# Patient Record
Sex: Female | Born: 1945 | Race: White | Hispanic: No | Marital: Married | Smoking: Never smoker
Health system: Southern US, Community
[De-identification: ages and names within clinical notes are randomized; demographics above are authoritative.]

## PROBLEM LIST (undated history)

## (undated) DIAGNOSIS — C801 Malignant (primary) neoplasm, unspecified: Secondary | ICD-10-CM

## (undated) DIAGNOSIS — I491 Atrial premature depolarization: Secondary | ICD-10-CM

## (undated) DIAGNOSIS — J45909 Unspecified asthma, uncomplicated: Secondary | ICD-10-CM

## (undated) DIAGNOSIS — I341 Nonrheumatic mitral (valve) prolapse: Secondary | ICD-10-CM

## (undated) DIAGNOSIS — F329 Major depressive disorder, single episode, unspecified: Secondary | ICD-10-CM

## (undated) DIAGNOSIS — R200 Anesthesia of skin: Secondary | ICD-10-CM

## (undated) DIAGNOSIS — R413 Other amnesia: Secondary | ICD-10-CM

## (undated) DIAGNOSIS — I890 Lymphedema, not elsewhere classified: Secondary | ICD-10-CM

## (undated) DIAGNOSIS — R2689 Other abnormalities of gait and mobility: Secondary | ICD-10-CM

## (undated) DIAGNOSIS — F419 Anxiety disorder, unspecified: Secondary | ICD-10-CM

## (undated) DIAGNOSIS — E785 Hyperlipidemia, unspecified: Secondary | ICD-10-CM

## (undated) DIAGNOSIS — C55 Malignant neoplasm of uterus, part unspecified: Secondary | ICD-10-CM

## (undated) DIAGNOSIS — I493 Ventricular premature depolarization: Secondary | ICD-10-CM

## (undated) DIAGNOSIS — K439 Ventral hernia without obstruction or gangrene: Secondary | ICD-10-CM

## (undated) DIAGNOSIS — I10 Essential (primary) hypertension: Secondary | ICD-10-CM

## (undated) DIAGNOSIS — C4490 Unspecified malignant neoplasm of skin, unspecified: Secondary | ICD-10-CM

## (undated) DIAGNOSIS — R011 Cardiac murmur, unspecified: Secondary | ICD-10-CM

## (undated) DIAGNOSIS — M1990 Unspecified osteoarthritis, unspecified site: Secondary | ICD-10-CM

## (undated) DIAGNOSIS — H409 Unspecified glaucoma: Secondary | ICD-10-CM

## (undated) DIAGNOSIS — I5189 Other ill-defined heart diseases: Secondary | ICD-10-CM

## (undated) DIAGNOSIS — K219 Gastro-esophageal reflux disease without esophagitis: Secondary | ICD-10-CM

## (undated) DIAGNOSIS — I7300 Raynaud's syndrome without gangrene: Secondary | ICD-10-CM

## (undated) HISTORY — PX: MITRAL VALVE REPAIR: SHX2039

## (undated) HISTORY — PX: TOTAL ABDOMINAL HYSTERECTOMY W/ BILATERAL SALPINGOOPHORECTOMY: SHX83

## (undated) HISTORY — DX: Atrial premature depolarization: I49.1

## (undated) HISTORY — PX: EYE SURGERY: SHX253

## (undated) HISTORY — PX: ABDOMINAL HYSTERECTOMY: SHX81

## (undated) HISTORY — PX: CATARACT EXTRACTION W/ INTRAOCULAR LENS IMPLANT: SHX1309

## (undated) HISTORY — PX: BREAST CYST ASPIRATION: SHX578

---

## 2005-05-29 ENCOUNTER — Ambulatory Visit: Payer: Self-pay | Admitting: Obstetrics and Gynecology

## 2006-01-12 ENCOUNTER — Emergency Department: Payer: Self-pay | Admitting: Emergency Medicine

## 2006-05-22 ENCOUNTER — Ambulatory Visit: Payer: Self-pay | Admitting: Obstetrics and Gynecology

## 2006-12-16 DIAGNOSIS — Z9889 Other specified postprocedural states: Secondary | ICD-10-CM

## 2006-12-16 HISTORY — DX: Other specified postprocedural states: Z98.890

## 2006-12-16 HISTORY — PX: LEFT HEART CATH AND CORONARY ANGIOGRAPHY: CATH118249

## 2006-12-17 DIAGNOSIS — Z952 Presence of prosthetic heart valve: Secondary | ICD-10-CM

## 2006-12-17 HISTORY — DX: Presence of prosthetic heart valve: Z95.2

## 2006-12-17 HISTORY — PX: MITRAL VALVE REPAIR: SHX2039

## 2006-12-24 ENCOUNTER — Other Ambulatory Visit: Payer: Self-pay

## 2006-12-24 ENCOUNTER — Emergency Department: Payer: Self-pay | Admitting: Emergency Medicine

## 2006-12-26 ENCOUNTER — Emergency Department: Payer: Self-pay | Admitting: Emergency Medicine

## 2006-12-26 ENCOUNTER — Other Ambulatory Visit: Payer: Self-pay

## 2007-01-22 ENCOUNTER — Encounter: Payer: Self-pay | Admitting: Internal Medicine

## 2007-02-04 ENCOUNTER — Encounter: Payer: Self-pay | Admitting: Internal Medicine

## 2007-03-06 ENCOUNTER — Encounter: Payer: Self-pay | Admitting: Internal Medicine

## 2007-04-06 ENCOUNTER — Encounter: Payer: Self-pay | Admitting: Internal Medicine

## 2007-05-06 ENCOUNTER — Encounter: Payer: Self-pay | Admitting: Internal Medicine

## 2007-06-26 ENCOUNTER — Ambulatory Visit: Payer: Self-pay | Admitting: Obstetrics and Gynecology

## 2007-07-04 ENCOUNTER — Encounter: Payer: Self-pay | Admitting: Internal Medicine

## 2008-08-10 ENCOUNTER — Ambulatory Visit: Payer: Self-pay | Admitting: Obstetrics and Gynecology

## 2008-10-12 ENCOUNTER — Ambulatory Visit (HOSPITAL_COMMUNITY): Admission: RE | Admit: 2008-10-12 | Discharge: 2008-10-13 | Payer: Self-pay | Admitting: Ophthalmology

## 2008-10-19 ENCOUNTER — Emergency Department: Payer: Self-pay | Admitting: Emergency Medicine

## 2008-11-11 ENCOUNTER — Ambulatory Visit (HOSPITAL_COMMUNITY): Admission: RE | Admit: 2008-11-11 | Discharge: 2008-11-12 | Payer: Self-pay | Admitting: Ophthalmology

## 2009-10-10 ENCOUNTER — Ambulatory Visit: Payer: Self-pay | Admitting: Obstetrics and Gynecology

## 2011-02-07 ENCOUNTER — Ambulatory Visit: Payer: Self-pay | Admitting: Obstetrics and Gynecology

## 2011-02-19 LAB — CBC
MCHC: 33.8 g/dL (ref 30.0–36.0)
RDW: 12.9 % (ref 11.5–15.5)

## 2011-02-19 LAB — PROTIME-INR
INR: 1 (ref 0.00–1.49)
Prothrombin Time: 13.9 seconds (ref 11.6–15.2)

## 2011-02-19 LAB — BASIC METABOLIC PANEL
CO2: 26 mEq/L (ref 19–32)
Calcium: 9.9 mg/dL (ref 8.4–10.5)
Creatinine, Ser: 0.77 mg/dL (ref 0.4–1.2)
Glucose, Bld: 126 mg/dL — ABNORMAL HIGH (ref 70–99)

## 2011-02-20 ENCOUNTER — Emergency Department: Payer: Self-pay | Admitting: Emergency Medicine

## 2011-03-20 NOTE — Op Note (Signed)
Whitney Pace, SOUDERS                  ACCOUNT NO.:  [id number]   MEDICAL RECORD NO.:  [id number]          PATIENT TYPE:  OIB   LOCATION:  5157                         FACILITY:  [facility]   PHYSICIAN:  Beulah Gandy. Ashley Royalty, M.D. DATE OF BIRTH:  01-17-1946   DATE OF PROCEDURE:  10/12/2008  DATE OF DISCHARGE:                               OPERATIVE REPORT   ADMISSION DIAGNOSIS:  Rhegmatogenous retinal detachment, left eye.   PROCEDURES:  1. Scleral buckle, left eye.  2. Gas injection, left eye.  3. Retinal photocoagulation, left eye.   SURGEON:  Beulah Gandy. Ashley Royalty, MD   ASSISTANT:  Rosalie Doctor, MA   ANESTHESIA:  General.   DETAILS:  Usual prep and drape, 360-degree limbal peritomy, isolation of  4 rectus muscles on 2-0 silk, localization of break at 6:30 o'clock,  scleral dissection from 1 o'clock to 11 o'clock to admit a #279  intrascleral implant.  Diathermy placed in the bed with a 508G radial  segment was placed beneath the break.  A #279 implant was fashioned to  fit in the superoscleral groove.  The #279 implant was placed around the  globe.  A 240 band was placed around the globe with a 270 sleeve at 11  o'clock.  Perforation site chosen at 4 o'clock.  A large amount of clear  colorless subretinal fluid came forth.  Perfluoropropane 40% 1.5 mL was  injected into the vitreous cavity to reinflate the globe.  Indirect  ophthalmoscopy showed the retina to be lying nicely on the scleral  buckle with a break well supported.  The indirect ophthalmoscope laser  was moved into place.  1066 burns were placed around the retinal breaks  and in the periphery with a power of 2000 milliwatts, 1000 microns each,  and 0.1 second each.  The buckle was adjusted and trimmed.  The band was  adjusted and trimmed.  The sutures were knotted and the free ends  removed.  The conjunctiva was reposited with 7-0 chromic suture.  Polymyxin and gentamicin were irrigated into [place] space.  Marcaine was  injected  around the globe for postop pain.  Decadron 10 mg was injected  into the lower subconjunctival space.  Marcaine was injected around the  globe for postop pain.  Polysporin ophthalmic ointment, a patch and  shield were placed.  Closing pressure was 10 with a Barraquer tonometer.   COMPLICATIONS:  None.   DURATION:  2 hours.      Beulah Gandy. Ashley Royalty, M.D.  Electronically Signed    JDM/MEDQ  D:  10/12/2008  T:  10/13/2008  Job:  [id number]

## 2011-03-20 NOTE — Op Note (Signed)
Whitney Pace, Whitney Pace                  ACCOUNT NO.:  [id number]   MEDICAL RECORD NO.:  [id number]          PATIENT TYPE:  OIB   LOCATION:  5120                         FACILITY:  [facility]   PHYSICIAN:  Beulah Gandy. Ashley Royalty, M.D. 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   PROCEDURES:  Pars plana vitrectomy, membrane peel, gas-fluid exchange,  Perfluoron injection, Perfluoron removal, silicone oil injection,  retinal photocoagulation, left eye.   SURGEON:  Beulah Gandy. Ashley Royalty, MD   ASSISTANT:  Rosalie Doctor, MA   ANESTHESIA:  General.   PROCEDURE IN DETAIL:  After usual prep and drape, peritomies at 10, 2,  and 4 o'clock, infusion at 4 o'clock, and Provisc placed on the corneal  surface.  The pars plana vitrectomy was begun just behind the  pseudophakos.  Dense white membranes were encountered.  The retina was  quickly seen in the view with the retina displaced anteriorly.  Star  folds were seen in the lower temporal and lower nasal periphery.  The  vitrectomy was carried down to the macular surface and to the retinal  surface and all membranes were removed and stripped.  The vitreous  forceps were used to engage to grasp membranes and peel them from the  star folds.  The membranes were then removed with a vitreous cutter.  The vitrectomy was carried down to the vitreous base for 360 degrees and  vitreous was removed down to the vitreous base.  Once this was  accomplished, Perfluoron was injected to reattach the retina.  The  retina was shortened and scarred at 6 o'clock.  Several breaks were seen  overlying the buckle.  The Perfluoron was only partially reattached the  retina and the scarring of the retina caused it to be elevated at 6  o'clock.  The Perfluoron was removed carefully and  intravitreal gas was  exchanged and almost a total reattachment was obtained at this point.  The silicone oil was then injected into the vitreous cavity to replace  this with the intravitreal gas.  The endolaser was positioned in the  eye, 540 burns were placed around the retinal periphery, the power  between 1500-2000 milliwatts, 1000 microns each and 0.1 seconds each.  The retina was satisfactorily attached at this point.  A 27-gauge needle  was used to perform peripheral iridectomy at 6 o'clock in the iris.  The  instruments were removed from the eye and 9-0 nylon was used to close  the sclerotomy sites.  The conjunctiva was closed with wet-field  cautery.  Polymyxin and gentamicin were irrigated into [place] space.  Atropine solution was applied.  Marcaine was injected around the globe  for postop pain.  Closing pressure was 15 with a Barraquer keratometer.  Complications none.  Operative time, 1 hour 10 minutes.  Polysporin, a  patch and shield were placed.  Decadron 10 mg was injected into the  lower subconjunctival space.  The patient  was awakened and taken to  recovery in satisfactory condition.     Beulah Gandy. Ashley Royalty, M.D.  Electronically Signed    JDM/MEDQ  D:  11/11/2008  T:  11/12/2008  Job:  [id number]

## 2011-06-30 ENCOUNTER — Observation Stay: Payer: Self-pay | Admitting: Unknown Physician Specialty

## 2011-07-17 ENCOUNTER — Ambulatory Visit: Payer: Self-pay | Admitting: Internal Medicine

## 2011-08-10 LAB — CBC
HCT: 42.6 % (ref 36.0–46.0)
MCHC: 33.8 g/dL (ref 30.0–36.0)
Platelets: 235 10*3/uL (ref 150–400)
RDW: 12.4 % (ref 11.5–15.5)

## 2011-08-10 LAB — BASIC METABOLIC PANEL
BUN: 18 mg/dL (ref 6–23)
CO2: 27 mEq/L (ref 19–32)
GFR calc non Af Amer: >60 mL/min (ref >60)
Glucose, Bld: 105 mg/dL — ABNORMAL HIGH (ref 70–99)
Potassium: 4.8 mEq/L (ref 3.5–5.1)

## 2012-04-10 ENCOUNTER — Ambulatory Visit: Payer: Self-pay | Admitting: Gastroenterology

## 2012-04-28 ENCOUNTER — Ambulatory Visit: Payer: Self-pay | Admitting: Obstetrics and Gynecology

## 2013-05-06 ENCOUNTER — Ambulatory Visit: Payer: Self-pay | Admitting: Internal Medicine

## 2014-05-17 ENCOUNTER — Ambulatory Visit: Payer: Self-pay | Admitting: Internal Medicine

## 2014-09-09 DIAGNOSIS — E782 Mixed hyperlipidemia: Secondary | ICD-10-CM | POA: Insufficient documentation

## 2014-09-09 DIAGNOSIS — I341 Nonrheumatic mitral (valve) prolapse: Secondary | ICD-10-CM | POA: Insufficient documentation

## 2014-09-09 DIAGNOSIS — I493 Ventricular premature depolarization: Secondary | ICD-10-CM | POA: Insufficient documentation

## 2014-11-19 ENCOUNTER — Emergency Department: Payer: Self-pay | Admitting: Emergency Medicine

## 2014-11-19 LAB — CBC WITH DIFFERENTIAL/PLATELET
BASOS ABS: 0 10*3/uL (ref 0.0–0.1)
Basophil %: 0.7 %
Eosinophil #: 0.2 10*3/uL (ref 0.0–0.7)
Eosinophil %: 2.6 %
HCT: 43.6 % (ref 35.0–47.0)
HGB: 14.4 g/dL (ref 12.0–16.0)
LYMPHS ABS: 2.8 10*3/uL (ref 1.0–3.6)
Lymphocyte %: 41.2 %
MCH: 31.1 pg (ref 26.0–34.0)
MCHC: 33.1 g/dL (ref 32.0–36.0)
MCV: 94 fL (ref 80–100)
MONO ABS: 0.5 x10 3/mm (ref 0.2–0.9)
Monocyte %: 7.3 %
Neutrophil #: 3.2 10*3/uL (ref 1.4–6.5)
Neutrophil %: 48.2 %
PLATELETS: 245 10*3/uL (ref 150–440)
RBC: 4.63 10*6/uL (ref 3.80–5.20)
RDW: 12.9 % (ref 11.5–14.5)
WBC: 6.7 10*3/uL (ref 3.6–11.0)

## 2014-11-19 LAB — COMPREHENSIVE METABOLIC PANEL
ALBUMIN: 4.3 g/dL (ref 3.4–5.0)
ANION GAP: 5 — AB (ref 7–16)
Alkaline Phosphatase: 61 U/L
BUN: 20 mg/dL — ABNORMAL HIGH (ref 7–18)
Bilirubin,Total: 0.4 mg/dL (ref 0.2–1.0)
CALCIUM: 9.1 mg/dL (ref 8.5–10.1)
CREATININE: 1.04 mg/dL (ref 0.60–1.30)
Chloride: 106 mmol/L (ref 98–107)
Co2: 28 mmol/L (ref 21–32)
EGFR (Non-African Amer.): 56 — ABNORMAL LOW
GLUCOSE: 98 mg/dL (ref 65–99)
Osmolality: 280 (ref 275–301)
POTASSIUM: 4 mmol/L (ref 3.5–5.1)
SGOT(AST): 21 U/L (ref 15–37)
SGPT (ALT): 23 U/L
SODIUM: 139 mmol/L (ref 136–145)
TOTAL PROTEIN: 7.6 g/dL (ref 6.4–8.2)

## 2014-11-19 LAB — PROTIME-INR
INR: 1
PROTHROMBIN TIME: 13.1 s (ref 11.5–14.7)

## 2014-11-19 LAB — URINALYSIS, COMPLETE
BILIRUBIN, UR: NEGATIVE
Bacteria: NONE SEEN
Blood: NEGATIVE
GLUCOSE, UR: NEGATIVE mg/dL (ref 0–75)
Nitrite: NEGATIVE
PH: 6 (ref 4.5–8.0)
PROTEIN: NEGATIVE
SPECIFIC GRAVITY: 1.013 (ref 1.003–1.030)

## 2014-11-19 LAB — TROPONIN I

## 2014-11-19 LAB — APTT: Activated PTT: 37 secs — ABNORMAL HIGH (ref 23.6–35.9)

## 2015-02-18 ENCOUNTER — Other Ambulatory Visit: Payer: Self-pay | Admitting: Internal Medicine

## 2015-02-18 DIAGNOSIS — Z1231 Encounter for screening mammogram for malignant neoplasm of breast: Secondary | ICD-10-CM

## 2015-06-08 ENCOUNTER — Other Ambulatory Visit: Payer: Self-pay | Admitting: Internal Medicine

## 2015-06-08 ENCOUNTER — Ambulatory Visit
Admission: RE | Admit: 2015-06-08 | Discharge: 2015-06-08 | Disposition: A | Discharge status: Home / Self Care | Payer: Medicare Other | Source: Ambulatory Visit | Attending: Internal Medicine | Admitting: Internal Medicine

## 2015-06-08 DIAGNOSIS — Z1231 Encounter for screening mammogram for malignant neoplasm of breast: Secondary | ICD-10-CM | POA: Diagnosis not present

## 2015-06-08 HISTORY — DX: Malignant (primary) neoplasm, unspecified: C80.1

## 2016-01-10 ENCOUNTER — Other Ambulatory Visit: Payer: Self-pay | Admitting: Physical Medicine and Rehabilitation

## 2016-01-10 DIAGNOSIS — M5416 Radiculopathy, lumbar region: Secondary | ICD-10-CM

## 2016-01-27 ENCOUNTER — Ambulatory Visit
Admission: RE | Admit: 2016-01-27 | Discharge: 2016-01-27 | Disposition: A | Discharge status: Home / Self Care | Payer: Medicare Other | Source: Ambulatory Visit | Attending: Physical Medicine and Rehabilitation | Admitting: Physical Medicine and Rehabilitation

## 2016-01-27 DIAGNOSIS — M5416 Radiculopathy, lumbar region: Secondary | ICD-10-CM

## 2016-01-27 DIAGNOSIS — M4696 Unspecified inflammatory spondylopathy, lumbar region: Secondary | ICD-10-CM | POA: Diagnosis not present

## 2016-02-15 DIAGNOSIS — M1612 Unilateral primary osteoarthritis, left hip: Secondary | ICD-10-CM | POA: Insufficient documentation

## 2016-04-23 ENCOUNTER — Other Ambulatory Visit: Payer: Self-pay | Admitting: Internal Medicine

## 2016-04-23 DIAGNOSIS — Z1231 Encounter for screening mammogram for malignant neoplasm of breast: Secondary | ICD-10-CM

## 2016-06-13 ENCOUNTER — Other Ambulatory Visit: Payer: Self-pay | Admitting: Internal Medicine

## 2016-06-13 ENCOUNTER — Ambulatory Visit
Admission: RE | Admit: 2016-06-13 | Discharge: 2016-06-13 | Disposition: A | Discharge status: Home / Self Care | Payer: Medicare Other | Source: Ambulatory Visit | Attending: Internal Medicine | Admitting: Internal Medicine

## 2016-06-13 DIAGNOSIS — Z1231 Encounter for screening mammogram for malignant neoplasm of breast: Secondary | ICD-10-CM

## 2016-08-22 NOTE — H&P (Signed)
TOTAL HIP ADMISSION H&P  Patient is admitted for left total hip arthroplasty, anterior approach.  Subjective:  Chief Complaint:    Left hip primary OA / pain  HPI: Gweneth Fritter, 70 y.o. female, has a history of pain and functional disability in the left hip(s) due to arthritis and patient has failed non-surgical conservative treatments for greater than 12 weeks to include NSAID's and/or analgesics, corticosteriod injections and activity modification.  Onset of symptoms was gradual starting 2+ years ago with gradually worsening course since that time.The patient noted no past surgery on the left hip(s).  Patient currently rates pain in the left hip at 8 out of 10 with activity. Patient has night pain, worsening of pain with activity and weight bearing, trendelenberg gait, pain that interfers with activities of daily living and pain with passive range of motion. Patient has evidence of periarticular osteophytes and joint space narrowing by imaging studies. This condition presents safety issues increasing the risk of falls.  There is no current active infection.  Risks, benefits and expectations were discussed with the patient.  Risks including but not limited to the risk of anesthesia, blood clots, nerve damage, blood vessel damage, failure of the prosthesis, infection and up to and including death.  Patient understand the risks, benefits and expectations and wishes to proceed with surgery.   PCP: Rusty Aus, MD  D/C Plans:      Home no HH  Post-op Meds:       No Rx given  Tranexamic Acid:      To be given - IV   Decadron:      Is to be given  FYI:     ASA  Norco    Past Medical History:  Diagnosis Date  . Cancer ([place])    uterine  . Cancer [facility])    skin    Past Surgical History:  Procedure Laterality Date  . BREAST CYST ASPIRATION Bilateral    negative    No prescriptions prior to admission.   Allergies not on file   Social History  Substance Use Topics  . Smoking status:  Not on file  . Smokeless tobacco: Not on file  . Alcohol use Not on file    Family History  Problem Relation Age of Onset  . Breast cancer Paternal Aunt     two aunts in their 28's     Review of Systems  Constitutional: Negative.   HENT: Negative.   Eyes: Negative.        Hx of retinal detachment, being treated.  Respiratory: Negative.   Cardiovascular: Negative.   Gastrointestinal: Negative.   Genitourinary: Negative.   Musculoskeletal: Positive for joint pain.  Skin: Negative.   Neurological: Negative.   Endo/Heme/Allergies: Negative.   Psychiatric/Behavioral: Negative.     Objective:  Physical Exam  Constitutional: She is oriented to person, place, and time. She appears well-developed.  HENT:  Head: Normocephalic.  Eyes: Pupils are equal, round, and reactive to light.  Neck: Neck supple. No JVD present. No tracheal deviation present. No thyromegaly present.  Cardiovascular: Normal rate, regular rhythm, normal heart sounds and intact distal pulses.   Respiratory: Effort normal and breath sounds normal. No respiratory distress. She has no wheezes.  GI: Soft. There is no tenderness. There is no guarding.  Musculoskeletal:       Left hip: She exhibits decreased range of motion, decreased strength, tenderness and bony tenderness. She exhibits no swelling, no deformity and no laceration.  Lymphadenopathy:  She has no cervical adenopathy.  Neurological: She is alert and oriented to person, place, and time.  Skin: Skin is warm and dry.  Psychiatric: She has a normal mood and affect.      Imaging Review Plain radiographs demonstrate severe degenerative joint disease of the left hip(s). The bone quality appears to be good for age and reported activity level.  Assessment/Plan:  End stage arthritis, left hip(s)  The patient history, physical examination, clinical judgement of the provider and imaging studies are consistent with end stage degenerative joint disease of  the left hip(s) and total hip arthroplasty is deemed medically necessary. The treatment options including medical management, injection therapy, arthroscopy and arthroplasty were discussed at length. The risks and benefits of total hip arthroplasty were presented and reviewed. The risks due to aseptic loosening, infection, stiffness, dislocation/subluxation,  thromboembolic complications and other imponderables were discussed.  The patient acknowledged the explanation, agreed to proceed with the plan and consent was signed. Patient is being admitted for inpatient treatment for surgery, pain control, PT, OT, prophylactic antibiotics, VTE prophylaxis, progressive ambulation and ADL's and discharge planning.The patient is planning to be discharged home.     [place] Deserie Dirks   PA-C  08/22/2016, 2:35 PM

## 2016-08-28 NOTE — Patient Instructions (Addendum)
Whitney Pace  08/28/2016   Your procedure is scheduled on: 09/04/16  Report to [facility]  Entrance take [place]  elevators to 3rd floor to  [place] at [phone] AM.  Call this number if you have problems the morning of surgery [phone]   Remember: ONLY 1 PERSON MAY GO WITH YOU TO SHORT STAY TO GET  READY MORNING OF [name].  Do not eat food or drink liquids :After Midnight.     Take these medicines the morning of surgery with A SIP OF WATER: GABAPENTIN MAY TAKE ALPRAZOLAM IF NEEDED                                You may not have any metal on your body including hair pins and              piercings  Do not wear jewelry, make-up, lotions, powders or perfumes, deodorant             Do not wear nail polish.  Do not shave  48 hours prior to surgery.              Men may shave face and neck.   Do not bring valuables to the hospital. [place].  Contacts, dentures or bridgework may not be worn into surgery.  Leave suitcase in the car. After surgery it may be brought to your room.                Please read over the following fact sheets you were given: _____________________________________________________________________             [facility] - Preparing for Surgery Before surgery, you can play an important role.  Because skin is not sterile, your skin needs to be as free of germs as possible.  You can reduce the number of germs on your skin by washing with CHG (chlorahexidine gluconate) soap before surgery.  CHG is an antiseptic cleaner which kills germs and bonds with the skin to continue killing germs even after washing. Please DO NOT use if you have an allergy to CHG or antibacterial soaps.  If your skin becomes reddened/irritated stop using the CHG and inform your nurse when you arrive at Short Stay. Do not shave (including legs and underarms) for at least 48 hours prior to the first CHG  shower.  You may shave your face/neck. Please follow these instructions carefully:  1.  Shower with CHG Soap the night before surgery and the  morning of Surgery.  2.  If you choose to wash your hair, wash your hair first as usual with your  normal  shampoo.  3.  After you shampoo, rinse your hair and body thoroughly to remove the  shampoo.                           4.  Use CHG as you would any other liquid soap.  You can apply chg directly  to the skin and wash                       Gently with a scrungie or clean washcloth.  5.  Apply  the CHG Soap to your body ONLY FROM THE NECK DOWN.   Do not use on face/ open                           Wound or open sores. Avoid contact with eyes, ears mouth and genitals (private parts).                       Wash face,  Genitals (private parts) with your normal soap.             6.  Wash thoroughly, paying special attention to the area where your surgery  will be performed.  7.  Thoroughly rinse your body with warm water from the neck down.  8.  DO NOT shower/wash with your normal soap after using and rinsing off  the CHG Soap.                9.  Pat yourself dry with a clean towel.            10.  Wear clean pajamas.            11.  Place clean sheets on your bed the night of your first shower and do not  sleep with pets. Day of Surgery : Do not apply any lotions/deodorants the morning of surgery.  Please wear clean clothes to the hospital/surgery center.  FAILURE TO FOLLOW THESE INSTRUCTIONS MAY RESULT IN THE CANCELLATION OF YOUR SURGERY PATIENT SIGNATURE_________________________________  NURSE SIGNATURE__________________________________  ________________________________________________________________________   Whitney Pace  An incentive spirometer is a tool that can help keep your lungs clear and active. This tool measures how well you are filling your lungs with each breath. Taking long deep breaths may help reverse or decrease the chance  of developing breathing (pulmonary) problems (especially infection) following:  A long period of time when you are unable to move or be active. BEFORE THE PROCEDURE   If the spirometer includes an indicator to show your best effort, your nurse or respiratory therapist will set it to a desired goal.  If possible, sit up straight or lean slightly forward. Try not to slouch.  Hold the incentive spirometer in an upright position. INSTRUCTIONS FOR USE  1. Sit on the edge of your bed if possible, or sit up as far as you can in bed or on a chair. 2. Hold the incentive spirometer in an upright position. 3. Breathe out normally. 4. Place the mouthpiece in your mouth and seal your lips tightly around it. 5. Breathe in slowly and as deeply as possible, raising the piston or the ball toward the top of the column. 6. Hold your breath for 3-5 seconds or for as long as possible. Allow the piston or ball to fall to the bottom of the column. 7. Remove the mouthpiece from your mouth and breathe out normally. 8. Rest for a few seconds and repeat Steps 1 through 7 at least 10 times every 1-2 hours when you are awake. Take your time and take a few normal breaths between deep breaths. 9. The spirometer may include an indicator to show your best effort. Use the indicator as a goal to work toward during each repetition. 10. After each set of 10 deep breaths, practice coughing to be sure your lungs are clear. If you have an incision (the cut made at the time of surgery), support your incision when coughing by placing a pillow or rolled  up towels firmly against it. Once you are able to get out of bed, walk around indoors and cough well. You may stop using the incentive spirometer when instructed by your caregiver.  RISKS AND COMPLICATIONS  Take your time so you do not get dizzy or light-headed.  If you are in pain, you may need to take or ask for pain medication before doing incentive spirometry. It is harder to  take a deep breath if you are having pain. AFTER USE  Rest and breathe slowly and easily.  It can be helpful to keep track of a log of your progress. Your caregiver can provide you with a simple table to help with this. If you are using the spirometer at home, follow these instructions: [place] IF:   You are having difficultly using the spirometer.  You have trouble using the spirometer as often as instructed.  Your pain medication is not giving enough relief while using the spirometer.  You develop fever of 100.5 F (38.1 C) or higher. SEEK IMMEDIATE MEDICAL CARE IF:   You cough up bloody sputum that had not been present before.  You develop fever of 102 F (38.9 C) or greater.  You develop worsening pain at or near the incision site. MAKE SURE YOU:   Understand these instructions.  Will watch your condition.  Will get help right away if you are not doing well or get worse. Document Released: 03/04/2007 Document Revised: 01/14/2012 Document Reviewed: 05/05/2007 Exit[employer] Patient Information 2014 [employer], [place].   ________________________________________________________________________  WHAT IS A BLOOD TRANSFUSION? Blood Transfusion Information  A transfusion is the replacement of blood or some of its parts. Blood is made up of multiple cells which provide different functions.  Red blood cells carry oxygen and are used for blood loss replacement.  White blood cells fight against infection.  Platelets control bleeding.  Plasma helps clot blood.  Other blood products are available for specialized needs, such as hemophilia or other clotting disorders. BEFORE THE TRANSFUSION  Who gives blood for transfusions?   Healthy volunteers who are fully evaluated to make sure their blood is safe. This is blood bank blood. Transfusion therapy is the safest it has ever been in the practice of medicine. Before blood is taken from a donor, a complete history is taken to  make sure that person has no history of diseases nor engages in risky social behavior (examples are intravenous drug use or sexual activity with multiple partners). The donor's travel history is screened to minimize risk of transmitting infections, such as malaria. The donated blood is tested for signs of infectious diseases, such as HIV and hepatitis. The blood is then tested to be sure it is compatible with you in order to minimize the chance of a transfusion reaction. If you or a relative donates blood, this is often done in anticipation of surgery and is not appropriate for emergency situations. It takes many days to process the donated blood. RISKS AND COMPLICATIONS Although transfusion therapy is very safe and saves many lives, the main dangers of transfusion include:   Getting an infectious disease.  Developing a transfusion reaction. This is an allergic reaction to something in the blood you were given. Every precaution is taken to prevent this. The decision to have a blood transfusion has been considered carefully by your caregiver before blood is given. Blood is not given unless the benefits outweigh the risks. AFTER THE TRANSFUSION  Right after receiving a blood transfusion, you will usually feel  much better and more energetic. This is especially true if your red blood cells have gotten low (anemic). The transfusion raises the level of the red blood cells which carry oxygen, and this usually causes an energy increase.  The nurse administering the transfusion will monitor you carefully for complications. HOME CARE INSTRUCTIONS  No special instructions are needed after a transfusion. You may find your energy is better. Speak with your caregiver about any limitations on activity for underlying diseases you may have. SEEK MEDICAL CARE IF:   Your condition is not improving after your transfusion.  You develop redness or irritation at the intravenous (IV) site. SEEK IMMEDIATE MEDICAL CARE  IF:  Any of the following symptoms occur over the next 12 hours:  Shaking chills.  You have a temperature by mouth above 102 F (38.9 C), not controlled by medicine.  Chest, back, or muscle pain.  People around you feel you are not acting correctly or are confused.  Shortness of breath or difficulty breathing.  Dizziness and fainting.  You get a rash or develop hives.  You have a decrease in urine output.  Your urine turns a dark color or changes to pink, red, or brown. Any of the following symptoms occur over the next 10 days:  You have a temperature by mouth above 102 F (38.9 C), not controlled by medicine.  Shortness of breath.  Weakness after normal activity.  The white part of the eye turns yellow (jaundice).  You have a decrease in the amount of urine or are urinating less often.  Your urine turns a dark color or changes to pink, red, or brown. Document Released: 10/19/2000 Document Revised: 01/14/2012 Document Reviewed: 06/07/2008 [facility] Patient Information 2014 [place], [place].  _______________________________________________________________________

## 2016-08-29 ENCOUNTER — Encounter (HOSPITAL_COMMUNITY): Payer: Self-pay

## 2016-08-29 ENCOUNTER — Encounter (HOSPITAL_COMMUNITY)
Admission: RE | Admit: 2016-08-29 | Discharge: 2016-08-29 | Disposition: A | Discharge status: Home / Self Care | Payer: Medicare Other | Source: Ambulatory Visit | Attending: Orthopedic Surgery | Admitting: Orthopedic Surgery

## 2016-08-29 DIAGNOSIS — Z01812 Encounter for preprocedural laboratory examination: Secondary | ICD-10-CM | POA: Insufficient documentation

## 2016-08-29 DIAGNOSIS — Z01810 Encounter for preprocedural cardiovascular examination: Secondary | ICD-10-CM | POA: Diagnosis present

## 2016-08-29 DIAGNOSIS — I10 Essential (primary) hypertension: Secondary | ICD-10-CM | POA: Insufficient documentation

## 2016-08-29 DIAGNOSIS — M1612 Unilateral primary osteoarthritis, left hip: Secondary | ICD-10-CM | POA: Diagnosis not present

## 2016-08-29 HISTORY — DX: Essential (primary) hypertension: I10

## 2016-08-29 HISTORY — DX: Unspecified asthma, uncomplicated: J45.909

## 2016-08-29 HISTORY — DX: Unspecified osteoarthritis, unspecified site: M19.90

## 2016-08-29 HISTORY — DX: Gastro-esophageal reflux disease without esophagitis: K21.9

## 2016-08-29 HISTORY — DX: Unspecified glaucoma: H40.9

## 2016-08-29 HISTORY — DX: Cardiac murmur, unspecified: R01.1

## 2016-08-29 HISTORY — DX: Anxiety disorder, unspecified: F41.9

## 2016-08-29 HISTORY — DX: Nonrheumatic mitral (valve) prolapse: I34.1

## 2016-08-29 LAB — SURGICAL PCR SCREEN
MRSA, PCR: NEGATIVE
Staphylococcus aureus: NEGATIVE

## 2016-08-29 LAB — CBC
HCT: 39.7 % (ref 36.0–46.0)
Hemoglobin: 13.1 g/dL (ref 12.0–15.0)
MCH: 30.9 pg (ref 26.0–34.0)
MCHC: 33 g/dL (ref 30.0–36.0)
MCV: 93.6 fL (ref 78.0–100.0)
PLATELETS: 237 10*3/uL (ref 150–400)
RBC: 4.24 MIL/uL (ref 3.87–5.11)
RDW: 13.1 % (ref 11.5–15.5)
WBC: 4.7 10*3/uL (ref 4.0–10.5)

## 2016-08-29 LAB — BASIC METABOLIC PANEL
Anion gap: 5 (ref 5–15)
BUN: 17 mg/dL (ref 6–20)
CALCIUM: 9.4 mg/dL (ref 8.9–10.3)
CO2: 27 mmol/L (ref 22–32)
CREATININE: 0.75 mg/dL (ref 0.44–1.00)
Chloride: 104 mmol/L (ref 101–111)
GFR calc Af Amer: >60 mL/min (ref >60)
Glucose, Bld: 101 mg/dL — ABNORMAL HIGH (ref 65–99)
POTASSIUM: 4.2 mmol/L (ref 3.5–5.1)
SODIUM: 136 mmol/L (ref 135–145)

## 2016-08-29 LAB — ABO/RH: ABO/RH(D): O NEG

## 2016-08-29 NOTE — Progress Notes (Signed)
PST APPT---Requested earlier last OV note, EKG and eccho from Dr Elta Guadeloupe Miller(pt states he follows her ekg's and eccho's now- not cardio) Received LOV note with message no recent ekg- last was 2012, and they did not have record of one at that office. NOTES placed on chart

## 2016-09-03 NOTE — Progress Notes (Signed)
Spoke with Brien Few at [facility] regarding previous VMM- states she will call pt

## 2016-09-03 NOTE — Progress Notes (Signed)
Interpreted EKG was not available on 08/30/16 when I checked for Result.  I spoke with Dr Jefm Petty, anesthesia who stated she has EKG changes and she needs to return to PCP before surgery, and they would like a eccho done.  LVMM with S Willis at [facility] regarding this

## 2016-09-04 ENCOUNTER — Encounter (HOSPITAL_COMMUNITY): Admission: RE | Payer: Self-pay | Source: Ambulatory Visit

## 2016-09-04 ENCOUNTER — Inpatient Hospital Stay (HOSPITAL_COMMUNITY): Admission: RE | Admit: 2016-09-04 | Payer: Medicare Other | Source: Ambulatory Visit | Admitting: Orthopedic Surgery

## 2016-09-04 LAB — TYPE AND SCREEN
ABO/RH(D): O NEG
Antibody Screen: NEGATIVE

## 2016-09-04 SURGERY — ARTHROPLASTY, HIP, TOTAL, ANTERIOR APPROACH
Anesthesia: Spinal | Site: Hip | Laterality: Left

## 2016-10-09 NOTE — Patient Instructions (Signed)
Whitney Pace  10/09/2016   Your procedure is scheduled on:   Report to [facility] Main  Entrance take [facility]  elevators to 3rd floor to  [place] at    0830 AM.  Call this number if you have problems the morning of surgery [phone]   Remember: ONLY 1 PERSON MAY GO WITH YOU TO SHORT STAY TO GET  READY MORNING OF [place].  Do not eat food or drink liquids :After Midnight.     Take these medicines the morning of surgery with A SIP OF WATER: Xanax if needed , eye drops as usual                                 You may not have any metal on your body including hair pins and              piercings  Do not wear jewelry, make-up, lotions, powders or perfumes, deodorant             Do not wear nail polish.  Do not shave  48 hours prior to surgery.     Do not bring valuables to the hospital. [name].  Contacts, dentures or bridgework may not be worn into surgery.  Leave suitcase in the car. After surgery it may be brought to your room.     N  Special Instructions: N/A              Please read over the following fact sheets you were given: _____________________________________________________________________             [facility] - Preparing for Surgery Before surgery, you can play an important role.  Because skin is not sterile, your skin needs to be as free of germs as possible.  You can reduce the number of germs on your skin by washing with CHG (chlorahexidine gluconate) soap before surgery.  CHG is an antiseptic cleaner which kills germs and bonds with the skin to continue killing germs even after washing. Please DO NOT use if you have an allergy to CHG or antibacterial soaps.  If your skin becomes reddened/irritated stop using the CHG and inform your nurse when you arrive at Short Stay. Do not shave (including legs and underarms) for at least 48 hours prior to the first CHG shower.  You may  shave your face/neck. Please follow these instructions carefully:  1.  Shower with CHG Soap the night before surgery and the  morning of Surgery.  2.  If you choose to wash your hair, wash your hair first as usual with your  normal  shampoo.  3.  After you shampoo, rinse your hair and body thoroughly to remove the  shampoo.                           4.  Use CHG as you would any other liquid soap.  You can apply chg directly  to the skin and wash                       Gently with a scrungie or clean washcloth.  5.  Apply the CHG Soap to  your body ONLY FROM THE NECK DOWN.   Do not use on face/ open                           Wound or open sores. Avoid contact with eyes, ears mouth and genitals (private parts).                       Wash face,  Genitals (private parts) with your normal soap.             6.  Wash thoroughly, paying special attention to the area where your surgery  will be performed.  7.  Thoroughly rinse your body with warm water from the neck down.  8.  DO NOT shower/wash with your normal soap after using and rinsing off  the CHG Soap.                9.  Pat yourself dry with a clean towel.            10.  Wear clean pajamas.            11.  Place clean sheets on your bed the night of your first shower and do not  sleep with pets. Day of Surgery : Do not apply any lotions/deodorants the morning of surgery.  Please wear clean clothes to the hospital/surgery center.  FAILURE TO FOLLOW THESE INSTRUCTIONS MAY RESULT IN THE CANCELLATION OF YOUR SURGERY PATIENT SIGNATURE_________________________________  NURSE SIGNATURE__________________________________  ________________________________________________________________________  WHAT IS A BLOOD TRANSFUSION? Blood Transfusion Information  A transfusion is the replacement of blood or some of its parts. Blood is made up of multiple cells which provide different functions.  Red blood cells carry oxygen and are used for blood loss  replacement.  White blood cells fight against infection.  Platelets control bleeding.  Plasma helps clot blood.  Other blood products are available for specialized needs, such as hemophilia or other clotting disorders. BEFORE THE TRANSFUSION  Who gives blood for transfusions?   Healthy volunteers who are fully evaluated to make sure their blood is safe. This is blood bank blood. Transfusion therapy is the safest it has ever been in the practice of medicine. Before blood is taken from a donor, a complete history is taken to make sure that person has no history of diseases nor engages in risky social behavior (examples are intravenous drug use or sexual activity with multiple partners). The donor's travel history is screened to minimize risk of transmitting infections, such as malaria. The donated blood is tested for signs of infectious diseases, such as HIV and hepatitis. The blood is then tested to be sure it is compatible with you in order to minimize the chance of a transfusion reaction. If you or a relative donates blood, this is often done in anticipation of surgery and is not appropriate for emergency situations. It takes many days to process the donated blood. RISKS AND COMPLICATIONS Although transfusion therapy is very safe and saves many lives, the main dangers of transfusion include:   Getting an infectious disease.  Developing a transfusion reaction. This is an allergic reaction to something in the blood you were given. Every precaution is taken to prevent this. The decision to have a blood transfusion has been considered carefully by your caregiver before blood is given. Blood is not given unless the benefits outweigh the risks. AFTER THE TRANSFUSION  Right after receiving a blood transfusion, you will usually  feel much better and more energetic. This is especially true if your red blood cells have gotten low (anemic). The transfusion raises the level of the red blood cells which  carry oxygen, and this usually causes an energy increase.  The nurse administering the transfusion will monitor you carefully for complications. HOME CARE INSTRUCTIONS  No special instructions are needed after a transfusion. You may find your energy is better. Speak with your caregiver about any limitations on activity for underlying diseases you may have. SEEK MEDICAL CARE IF:   Your condition is not improving after your transfusion.  You develop redness or irritation at the intravenous (IV) site. SEEK IMMEDIATE MEDICAL CARE IF:  Any of the following symptoms occur over the next 12 hours:  Shaking chills.  You have a temperature by mouth above 102 F (38.9 C), not controlled by medicine.  Chest, back, or muscle pain.  People around you feel you are not acting correctly or are confused.  Shortness of breath or difficulty breathing.  Dizziness and fainting.  You get a rash or develop hives.  You have a decrease in urine output.  Your urine turns a dark color or changes to pink, red, or brown. Any of the following symptoms occur over the next 10 days:  You have a temperature by mouth above 102 F (38.9 C), not controlled by medicine.  Shortness of breath.  Weakness after normal activity.  The white part of the eye turns yellow (jaundice).  You have a decrease in the amount of urine or are urinating less often.  Your urine turns a dark color or changes to pink, red, or brown. Document Released: 10/19/2000 Document Revised: 01/14/2012 Document Reviewed: 06/07/2008 ExitCare Patient Information 2014 [place].  _______________________________________________________________________  Incentive Spirometer  An incentive spirometer is a tool that can help keep your lungs clear and active. This tool measures how well you are filling your lungs with each breath. Taking long deep breaths may help reverse or decrease the chance of developing breathing (pulmonary) problems  (especially infection) following:  A long period of time when you are unable to move or be active. BEFORE THE PROCEDURE   If the spirometer includes an indicator to show your best effort, your nurse or respiratory therapist will set it to a desired goal.  If possible, sit up straight or lean slightly forward. Try not to slouch.  Hold the incentive spirometer in an upright position. INSTRUCTIONS FOR USE  1. Sit on the edge of your bed if possible, or sit up as far as you can in bed or on a chair. 2. Hold the incentive spirometer in an upright position. 3. Breathe out normally. 4. Place the mouthpiece in your mouth and seal your lips tightly around it. 5. Breathe in slowly and as deeply as possible, raising the piston or the ball toward the top of the column. 6. Hold your breath for 3-5 seconds or for as long as possible. Allow the piston or ball to fall to the bottom of the column. 7. Remove the mouthpiece from your mouth and breathe out normally. 8. Rest for a few seconds and repeat Steps 1 through 7 at least 10 times every 1-2 hours when you are awake. Take your time and take a few normal breaths between deep breaths. 9. The spirometer may include an indicator to show your best effort. Use the indicator as a goal to work toward during each repetition. 10. After each set of 10 deep breaths, practice coughing to  be sure your lungs are clear. If you have an incision (the cut made at the time of surgery), support your incision when coughing by placing a pillow or rolled up towels firmly against it. Once you are able to get out of bed, walk around indoors and cough well. You may stop using the incentive spirometer when instructed by your caregiver.  RISKS AND COMPLICATIONS  Take your time so you do not get dizzy or light-headed.  If you are in pain, you may need to take or ask for pain medication before doing incentive spirometry. It is harder to take a deep breath if you are having  pain. AFTER USE  Rest and breathe slowly and easily.  It can be helpful to keep track of a log of your progress. Your caregiver can provide you with a simple table to help with this. If you are using the spirometer at home, follow these instructions: [place] IF:   You are having difficultly using the spirometer.  You have trouble using the spirometer as often as instructed.  Your pain medication is not giving enough relief while using the spirometer.  You develop fever of 100.5 F (38.1 C) or higher. SEEK IMMEDIATE MEDICAL CARE IF:   You cough up bloody sputum that had not been present before.  You develop fever of 102 F (38.9 C) or greater.  You develop worsening pain at or near the incision site. MAKE SURE YOU:   Understand these instructions.  Will watch your condition.  Will get help right away if you are not doing well or get worse. Document Released: 03/04/2007 Document Revised: 01/14/2012 Document Reviewed: 05/05/2007 [facility] Patient Information 2014 [place], [place].   ________________________________________________________________________

## 2016-10-10 ENCOUNTER — Encounter (INDEPENDENT_AMBULATORY_CARE_PROVIDER_SITE_OTHER): Payer: Self-pay

## 2016-10-10 ENCOUNTER — Encounter (HOSPITAL_COMMUNITY): Payer: Self-pay

## 2016-10-10 ENCOUNTER — Encounter (HOSPITAL_COMMUNITY)
Admission: RE | Admit: 2016-10-10 | Discharge: 2016-10-10 | Disposition: A | Discharge status: Home / Self Care | Payer: Medicare Other | Source: Ambulatory Visit | Attending: Orthopedic Surgery | Admitting: Orthopedic Surgery

## 2016-10-10 DIAGNOSIS — M1612 Unilateral primary osteoarthritis, left hip: Secondary | ICD-10-CM | POA: Insufficient documentation

## 2016-10-10 DIAGNOSIS — Z01812 Encounter for preprocedural laboratory examination: Secondary | ICD-10-CM | POA: Diagnosis present

## 2016-10-10 LAB — BASIC METABOLIC PANEL
ANION GAP: 6 (ref 5–15)
BUN: 18 mg/dL (ref 6–20)
CO2: 27 mmol/L (ref 22–32)
Calcium: 9.3 mg/dL (ref 8.9–10.3)
Chloride: 105 mmol/L (ref 101–111)
Creatinine, Ser: 0.83 mg/dL (ref 0.44–1.00)
GFR calc Af Amer: >60 mL/min (ref >60)
GLUCOSE: 94 mg/dL (ref 65–99)
POTASSIUM: 4 mmol/L (ref 3.5–5.1)
Sodium: 138 mmol/L (ref 135–145)

## 2016-10-10 LAB — CBC
HEMATOCRIT: 38.8 % (ref 36.0–46.0)
HEMOGLOBIN: 12.9 g/dL (ref 12.0–15.0)
MCH: 31.2 pg (ref 26.0–34.0)
MCHC: 33.2 g/dL (ref 30.0–36.0)
MCV: 93.7 fL (ref 78.0–100.0)
Platelets: 234 10*3/uL (ref 150–400)
RBC: 4.14 MIL/uL (ref 3.87–5.11)
RDW: 12.8 % (ref 11.5–15.5)
WBC: 5 10*3/uL (ref 4.0–10.5)

## 2016-10-10 LAB — SURGICAL PCR SCREEN
MRSA, PCR: NEGATIVE
STAPHYLOCOCCUS AUREUS: NEGATIVE

## 2016-10-10 NOTE — Progress Notes (Signed)
09/04/16- EKG on chart signed off by Dr Sabra Heck  Stress 09/05/16 on chart  Clearance- Dr Miller-09/10/16 on chart  Echo - 09/05/16- on chart

## 2016-10-11 ENCOUNTER — Other Ambulatory Visit: Payer: Self-pay

## 2016-10-11 ENCOUNTER — Emergency Department: Payer: Medicare Other

## 2016-10-11 ENCOUNTER — Emergency Department
Admission: EM | Admit: 2016-10-11 | Discharge: 2016-10-11 | Disposition: A | Discharge status: Home / Self Care | Payer: Medicare Other | Attending: Emergency Medicine | Admitting: Emergency Medicine

## 2016-10-11 ENCOUNTER — Encounter: Payer: Self-pay | Admitting: Emergency Medicine

## 2016-10-11 DIAGNOSIS — J45909 Unspecified asthma, uncomplicated: Secondary | ICD-10-CM | POA: Insufficient documentation

## 2016-10-11 DIAGNOSIS — Z85828 Personal history of other malignant neoplasm of skin: Secondary | ICD-10-CM | POA: Insufficient documentation

## 2016-10-11 DIAGNOSIS — Z8541 Personal history of malignant neoplasm of cervix uteri: Secondary | ICD-10-CM | POA: Diagnosis not present

## 2016-10-11 DIAGNOSIS — Z7982 Long term (current) use of aspirin: Secondary | ICD-10-CM | POA: Insufficient documentation

## 2016-10-11 DIAGNOSIS — I10 Essential (primary) hypertension: Secondary | ICD-10-CM | POA: Diagnosis not present

## 2016-10-11 DIAGNOSIS — R110 Nausea: Secondary | ICD-10-CM | POA: Diagnosis not present

## 2016-10-11 DIAGNOSIS — Z79899 Other long term (current) drug therapy: Secondary | ICD-10-CM | POA: Insufficient documentation

## 2016-10-11 LAB — CBC
HEMATOCRIT: 39.6 % (ref 35.0–47.0)
HEMOGLOBIN: 13.6 g/dL (ref 12.0–16.0)
MCH: 31.7 pg (ref 26.0–34.0)
MCHC: 34.5 g/dL (ref 32.0–36.0)
MCV: 92.1 fL (ref 80.0–100.0)
Platelets: 227 10*3/uL (ref 150–440)
RBC: 4.3 MIL/uL (ref 3.80–5.20)
RDW: 12.8 % (ref 11.5–14.5)
WBC: 12.2 10*3/uL — AB (ref 3.6–11.0)

## 2016-10-11 LAB — BASIC METABOLIC PANEL
ANION GAP: 9 (ref 5–15)
BUN: 20 mg/dL (ref 6–20)
CO2: 26 mmol/L (ref 22–32)
Calcium: 9.4 mg/dL (ref 8.9–10.3)
Chloride: 98 mmol/L — ABNORMAL LOW (ref 101–111)
Creatinine, Ser: 0.81 mg/dL (ref 0.44–1.00)
GFR calc Af Amer: >60 mL/min (ref >60)
GLUCOSE: 135 mg/dL — AB (ref 65–99)
POTASSIUM: 4 mmol/L (ref 3.5–5.1)
SODIUM: 133 mmol/L — AB (ref 135–145)

## 2016-10-11 LAB — URINALYSIS, COMPLETE (UACMP) WITH MICROSCOPIC
BACTERIA UA: NONE SEEN
BILIRUBIN URINE: NEGATIVE
Glucose, UA: NEGATIVE mg/dL
HGB URINE DIPSTICK: NEGATIVE
Ketones, ur: 5 mg/dL — AB
NITRITE: NEGATIVE
PROTEIN: NEGATIVE mg/dL
RBC / HPF: NONE SEEN RBC/hpf (ref 0–5)
Specific Gravity, Urine: 1.012 (ref 1.005–1.030)
pH: 7 (ref 5.0–8.0)

## 2016-10-11 LAB — TROPONIN I
Troponin I: <0.03 ng/mL (ref <0.03)
Troponin I: <0.03 ng/mL (ref <0.03)

## 2016-10-11 MED ORDER — ACETAMINOPHEN 500 MG PO TABS
1000.0000 mg | ORAL_TABLET | Freq: Once | ORAL | Status: AC
  Administered 2016-10-11: 1000 mg via ORAL
  Filled 2016-10-11: qty 2

## 2016-10-11 NOTE — ED Provider Notes (Signed)
[facility] Emergency Department Provider Note ____________________________________________   I have reviewed the triage vital signs and the triage nursing note.  HISTORY  Chief Complaint Chest Pain   Historian Patient  HPI CADESHA RANDLETT is a 70 y.o. female presents today after feeling lightheaded and slightly nauseated and feeling a hot flushed feeling this morning after taking a tramadol this morning. She is scheduled to have hip surgery in the left hip on Monday, and has been told to be off of her diclofenac. She was prescribed tramadol and took 1 dose, her first dose last night, and then second dose this morning and then felt bad about an hour or so after that and thought it was side effect from the new medication tramadol.  No palpitations or chest pain. No vomiting. No diarrhea. No abdominal pain. She feels much better now. Denies urinary symptoms.  Denies recent cough congestion or fevers. Denies headache. No confusion altered mental status.    Past Medical History:  Diagnosis Date  . Anxiety   . Arthritis   . Asthma    as a child  . Cancer ([place])    uterine  . Cancer ([place])    skin  . GERD (gastroesophageal reflux disease)   . Glaucoma   . Heart murmur   . Hypertension   . MVP (mitral valve prolapse)    with repair 2008 at [facility]    There are no active problems to display for this patient.   Past Surgical History:  Procedure Laterality Date  . ABDOMINAL HYSTERECTOMY     with BSO  . BREAST CYST ASPIRATION Bilateral    negative  . EYE SURGERY Left    repair detatched retina x 2, removal of "bubble' AND SCAR TISSUE IN EYE  . EYE SURGERY Left    laser treatment left eye x 3  . EYE SURGERY Bilateral    cataract extraction with IOL  . MITRAL VALVE REPAIR      Prior to Admission medications   Medication Sig Start Date End Date Taking? Authorizing Provider  ALPRAZolam Duanne Moron) 0.25 MG tablet Take 0.0625-0.25 mg by mouth daily as needed for  anxiety.   Yes Historical Provider, MD  aspirin EC 325 MG tablet Take 325 mg by mouth at bedtime.   Yes Historical Provider, MD  brimonidine-timolol (COMBIGAN) 0.2-0.5 % ophthalmic solution Place 1 drop into both eyes every 12 (twelve) hours.   Yes Historical Provider, MD  CALCIUM-VITAMIN D PO Take 1 tablet by mouth daily.   Yes Historical Provider, MD  diclofenac (CATAFLAM) 50 MG tablet Take 50 mg by mouth 2 (two) times daily.   Yes Historical Provider, MD  dorzolamide (TRUSOPT) 2 % ophthalmic solution Place 1 drop into the left eye 2 (two) times daily.   Yes Historical Provider, MD  gabapentin (NEURONTIN) 300 MG capsule Take 300 mg by mouth 3 (three) times daily.   Yes Historical Provider, MD  losartan (COZAAR) 50 MG tablet Take 25 mg by mouth at bedtime.    Yes Historical Provider, MD  lovastatin (MEVACOR) 40 MG tablet Take 40 mg by mouth at bedtime.   Yes Historical Provider, MD  magnesium oxide (MAG-OX) 400 MG tablet Take 400 mg by mouth 2 (two) times daily.   Yes Historical Provider, MD  Multiple Vitamin (MULTIVITAMIN WITH MINERALS) TABS tablet Take 0.5 tablets by mouth 2 (two) times daily.   Yes Historical Provider, MD  ranitidine (ZANTAC) 75 MG tablet Take 75 mg by mouth daily as needed  for heartburn.    Yes Historical Provider, MD  traMADol (ULTRAM) 50 MG tablet Take 50 mg by mouth every 6 (six) hours as needed.   Yes Historical Provider, MD  vitamin C (ASCORBIC ACID) 500 MG tablet Take 500 mg by mouth daily.   Yes Historical Provider, MD    Allergies  Allergen Reactions  . Atropine Other (See Comments)    Increased blood pressure  . Promethazine Other (See Comments)    anxiety    Family History  Problem Relation Age of Onset  . Breast cancer Paternal Aunt     two aunts in their 81's    Social History Social History  Substance Use Topics  . Smoking status: Never Smoker  . Smokeless tobacco: Never Used  . Alcohol use No     Comment: wine occaasionally    Review of  Systems  Constitutional: Negative for fever. Eyes: Negative for visual changes. ENT: Negative for sore throat. Cardiovascular: Negative for chest pain. Respiratory: Negative for shortness of breath. Gastrointestinal: Negative for abdominal pain, vomiting and diarrhea. Genitourinary: Negative for dysuria. Musculoskeletal: Negative for back pain. Skin: Negative for rash. Neurological: Negative for headache. 10 point Review of Systems otherwise negative ____________________________________________   PHYSICAL EXAM:  VITAL SIGNS: ED Triage Vitals  Enc Vitals Group     BP 10/11/16 1134 (!) 151/94     Pulse Rate 10/11/16 1134 69     Resp 10/11/16 1134 12     Temp 10/11/16 1134 97.5 F (36.4 C)     Temp Source 10/11/16 1134 Oral     SpO2 10/11/16 1134 100 %     Weight 10/11/16 1132 150 lb (68 kg)     Height 10/11/16 1132 5\' 3"  (1.6 m)     Head Circumference --      Peak Flow --      Pain Score 10/11/16 1205 6     Pain Loc --      Pain Edu? --      Excl. in [place]? --      Constitutional: Alert and oriented. Well appearing and in no distress. HEENT   Head: Normocephalic and atraumatic.      Eyes: Conjunctivae are normal. PERRL. Normal extraocular movements.      Ears:         Nose: No congestion/rhinnorhea.   Mouth/Throat: Mucous membranes are moist.   Neck: No stridor. Cardiovascular/Chest: Normal rate, regular rhythm.  No murmurs, rubs, or gallops. Respiratory: Normal respiratory effort without tachypnea nor retractions. Breath sounds are clear and equal bilaterally. No wheezes/rales/rhonchi. Gastrointestinal: Soft. No distention, no guarding, no rebound. Nontender.    Genitourinary/rectal:Deferred Musculoskeletal: Tenderness with range of motion to her left hip Neurologic:  Normal speech and language. No gross or focal neurologic deficits are appreciated. Skin:  Skin is warm, dry and intact. No rash noted. Psychiatric: Mood and affect are normal. Speech and  behavior are normal. Patient exhibits appropriate insight and judgment.   ____________________________________________  LABS (pertinent positives/negatives)  Labs Reviewed  BASIC METABOLIC PANEL - Abnormal; Notable for the following:       Result Value   Sodium 133 (*)    Chloride 98 (*)    Glucose, Bld 135 (*)    All other components within normal limits  CBC - Abnormal; Notable for the following:    WBC 12.2 (*)    All other components within normal limits  URINALYSIS, COMPLETE (UACMP) WITH MICROSCOPIC - Abnormal; Notable for the following:    Color,  Urine YELLOW (*)    APPearance HAZY (*)    Ketones, ur 5 (*)    Leukocytes, UA TRACE (*)    Squamous Epithelial / LPF 0-5 (*)    All other components within normal limits  TROPONIN I  TROPONIN I    ____________________________________________    EKG I, Lisa Roca, MD, the attending physician have personally viewed and interpreted all ECGs.  EKG #1 58 bpm. Normal sinus rhythm. Nonspecific intraventricular conduction delay. Nonspecific ST and T-wave.  EKG #2 61 bpm. Normal sinus rhythm. Nonspecific intraventricular conduction delay. PVC. Normal axis. Nonspecific ST and T-wave ____________________________________________  RADIOLOGY All Xrays were viewed by me. Imaging interpreted by Radiologist.  Chest x-ray two-view: Bibasilar linear atelectasis or scarring. #2 hyperaeration. No pneumonia or effusion. __________________________________________  PROCEDURES  Procedure(s) performed: None  Critical Care performed: None  ____________________________________________   ED COURSE / ASSESSMENT AND PLAN  Pertinent labs & imaging results that were available during my care of the patient were reviewed by me and considered in my medical decision making (see chart for details).   Ms. Jaudon came in with some nonspecific feelings after taking a tramadol, and I certainly think they could then side effect from this medication.  She is feeling better now. Laboratory evaluation is reassuring. Symptoms by history and on exam are not suggestive of acute anemia, infection, stroke, cardiovascular emergency.  We discussed other pain control options, and I think it is okay for her to take Tylenol. She does not want to try something stronger like hydrocodone or oxycodone, which I suspect might give her side effects of the tramadol did.  Ultimately, we discussed return precautions with respect to other emergencies with nonspecific symptoms, any worsening or recurrence.    CONSULTATIONS:  None   Patient / Family / Caregiver informed of clinical course, medical decision-making process, and agree with plan.   I discussed return precautions, follow-up instructions, and discharge instructions with patient and/or family.   ___________________________________________   FINAL CLINICAL IMPRESSION(S) / ED DIAGNOSES   Final diagnoses:  Nausea              Note: This dictation was prepared with Dragon dictation. Any transcriptional errors that result from this process are unintentional    Lisa Roca, MD 10/11/16 1525

## 2016-10-11 NOTE — ED Triage Notes (Signed)
Pt comes into the ED via POV c/o chest tightness from upper abdomen to her neck.  Accompanied by N/V, dizziness, and lightheadedness.  Patient believes it is a reaction to her tramadol that she is taking for a hip replacement that is going to be occurring next week.  Patient presents in NAD at this time with even and unlabored respirations.

## 2016-10-11 NOTE — Discharge Instructions (Signed)
You were evaluated for symptoms this morning that do sound like they were probably side effect from the tramadol pain medication. Instead of tramadol, maybe just try Tylenol over-the-counter as needed for pain relief.  Your exam and evaluation in the emergency department are reassuring today.  Return to the emergency department for any worsening symptoms including dizziness, one-sided weakness or numbness, slurred speech, confusion or altered mental status, chest pain, heart racing, dizziness or passing out, or any other symptoms concerning to you.

## 2016-10-14 NOTE — H&P (Signed)
TOTAL HIP ADMISSION H&P  Patient is admitted for left total hip arthroplasty, anterior approach.  Subjective:  Chief Complaint:   Left hip primary OA / pain  HPI: Whitney Pace, 70 y.o. female, has a history of pain and functional disability in the left hip(s) due to arthritis and patient has failed non-surgical conservative treatments for greater than 12 weeks to include NSAID's and/or analgesics, corticosteriod injections and activity modification.  Onset of symptoms was gradual starting 2+ years ago with gradually worsening course since that time.The patient noted no past surgery on the left hip(s).  Patient currently rates pain in the left hip at 8 out of 10 with activity. Patient has night pain, worsening of pain with activity and weight bearing, trendelenberg gait, pain that interfers with activities of daily living and pain with passive range of motion. Patient has evidence of periarticular osteophytes and joint space narrowing by imaging studies. This condition presents safety issues increasing the risk of falls.  There is no current active infection.   Risks, benefits and expectations were discussed with the patient.  Risks including but not limited to the risk of anesthesia, blood clots, nerve damage, blood vessel damage, failure of the prosthesis, infection and up to and including death.  Patient understand the risks, benefits and expectations and wishes to proceed with surgery.   PCP: Rusty Aus, MD  D/C Plans:      Home, no HHPT  Post-op Meds:       No Rx given   Tranexamic Acid:      To be given - IV   Decadron:      Is to be given  FYI:     ASA  Norco    Past Medical History:  Diagnosis Date  . Anxiety   . Arthritis   . Asthma    as a child  . Cancer ([place])    uterine  . Cancer ([place])    skin  . GERD (gastroesophageal reflux disease)   . Glaucoma   . Heart murmur   . Hypertension   . MVP (mitral valve prolapse)    with repair [facility] at [facility]    Past Surgical  History:  Procedure Laterality Date  . ABDOMINAL HYSTERECTOMY     with BSO  . BREAST CYST ASPIRATION Bilateral    negative  . EYE SURGERY Left    repair detatched retina x 2, removal of "bubble' AND SCAR TISSUE IN EYE  . EYE SURGERY Left    laser treatment left eye x 3  . EYE SURGERY Bilateral    cataract extraction with IOL  . MITRAL VALVE REPAIR      No prescriptions prior to admission.   Allergies  Allergen Reactions  . Atropine Other (See Comments)    Increased blood pressure  . Promethazine Other (See Comments)    anxiety    Social History  Substance Use Topics  . Smoking status: Never Smoker  . Smokeless tobacco: Never Used  . Alcohol use No     Comment: wine occaasionally    Family History  Problem Relation Age of Onset  . Breast cancer Paternal Aunt     two aunts in their 43's     Review of Systems  Constitutional: Negative.   HENT: Negative.   Eyes: Negative.   Respiratory: Negative.   Cardiovascular: Negative.   Gastrointestinal: Positive for heartburn.  Genitourinary: Negative.   Musculoskeletal: Positive for joint pain.  Skin: Negative.   Neurological: Negative.  Endo/Heme/Allergies: Negative.   Psychiatric/Behavioral: The patient is nervous/anxious.     Objective:  Physical Exam  Constitutional: She is oriented to person, place, and time. She appears well-developed.  HENT:  Head: Normocephalic.  Eyes: Pupils are equal, round, and reactive to light.  Neck: Neck supple. No JVD present. No tracheal deviation present. No thyromegaly present.  Cardiovascular: Normal rate, regular rhythm and intact distal pulses.   Murmur heard. Respiratory: Effort normal and breath sounds normal. No respiratory distress. She has no wheezes.  GI: Soft. There is no tenderness. There is no guarding.  Musculoskeletal:       Left hip: She exhibits decreased range of motion, decreased strength, tenderness and bony tenderness. She exhibits no swelling, no deformity  and no laceration.  Lymphadenopathy:    She has no cervical adenopathy.  Neurological: She is alert and oriented to person, place, and time.  Skin: Skin is warm and dry.  Psychiatric: She has a normal mood and affect.      Labs:  Estimated body mass index is 26.57 kg/m as calculated from the following:   Height as of 10/11/16: 5\' 3"  (1.6 m).   Weight as of 10/11/16: 68 kg (150 lb).   Imaging Review Plain radiographs demonstrate severe degenerative joint disease of the left hip(s). The bone quality appears to be good for age and reported activity level.  Assessment/Plan:  End stage arthritis, left hip(s)  The patient history, physical examination, clinical judgement of the provider and imaging studies are consistent with end stage degenerative joint disease of the left hip(s) and total hip arthroplasty is deemed medically necessary. The treatment options including medical management, injection therapy, arthroscopy and arthroplasty were discussed at length. The risks and benefits of total hip arthroplasty were presented and reviewed. The risks due to aseptic loosening, infection, stiffness, dislocation/subluxation,  thromboembolic complications and other imponderables were discussed.  The patient acknowledged the explanation, agreed to proceed with the plan and consent was signed. Patient is being admitted for inpatient treatment for surgery, pain control, PT, OT, prophylactic antibiotics, VTE prophylaxis, progressive ambulation and ADL's and discharge planning.The patient is planning to be discharged home.      West Pugh Swanson Farnell   PA-C  10/14/2016, 8:35 PM

## 2016-10-15 ENCOUNTER — Inpatient Hospital Stay (HOSPITAL_COMMUNITY)
Admission: RE | Admit: 2016-10-15 | Discharge: 2016-10-16 | DRG: 470 | Disposition: A | Discharge status: Home / Self Care | Payer: Medicare Other | Source: Ambulatory Visit | Attending: Orthopedic Surgery | Admitting: Orthopedic Surgery

## 2016-10-15 ENCOUNTER — Encounter (HOSPITAL_COMMUNITY): Payer: Self-pay | Admitting: *Deleted

## 2016-10-15 ENCOUNTER — Inpatient Hospital Stay (HOSPITAL_COMMUNITY): Payer: Medicare Other

## 2016-10-15 ENCOUNTER — Inpatient Hospital Stay (HOSPITAL_COMMUNITY): Payer: Medicare Other | Admitting: Anesthesiology

## 2016-10-15 ENCOUNTER — Encounter (HOSPITAL_COMMUNITY)
Admission: RE | Disposition: A | Discharge status: Home / Self Care | Payer: Self-pay | Source: Ambulatory Visit | Attending: Orthopedic Surgery

## 2016-10-15 DIAGNOSIS — Z888 Allergy status to other drugs, medicaments and biological substances status: Secondary | ICD-10-CM | POA: Diagnosis not present

## 2016-10-15 DIAGNOSIS — J45909 Unspecified asthma, uncomplicated: Secondary | ICD-10-CM | POA: Diagnosis present

## 2016-10-15 DIAGNOSIS — Z90710 Acquired absence of both cervix and uterus: Secondary | ICD-10-CM

## 2016-10-15 DIAGNOSIS — M25552 Pain in left hip: Secondary | ICD-10-CM | POA: Diagnosis present

## 2016-10-15 DIAGNOSIS — I341 Nonrheumatic mitral (valve) prolapse: Secondary | ICD-10-CM | POA: Diagnosis present

## 2016-10-15 DIAGNOSIS — M1612 Unilateral primary osteoarthritis, left hip: Secondary | ICD-10-CM | POA: Diagnosis present

## 2016-10-15 DIAGNOSIS — Z96649 Presence of unspecified artificial hip joint: Secondary | ICD-10-CM

## 2016-10-15 DIAGNOSIS — K219 Gastro-esophageal reflux disease without esophagitis: Secondary | ICD-10-CM | POA: Diagnosis present

## 2016-10-15 DIAGNOSIS — H409 Unspecified glaucoma: Secondary | ICD-10-CM | POA: Diagnosis present

## 2016-10-15 DIAGNOSIS — F419 Anxiety disorder, unspecified: Secondary | ICD-10-CM | POA: Diagnosis present

## 2016-10-15 DIAGNOSIS — I10 Essential (primary) hypertension: Secondary | ICD-10-CM | POA: Diagnosis present

## 2016-10-15 HISTORY — PX: TOTAL HIP ARTHROPLASTY: SHX124

## 2016-10-15 LAB — TYPE AND SCREEN
ABO/RH(D): O NEG
Antibody Screen: NEGATIVE

## 2016-10-15 SURGERY — ARTHROPLASTY, HIP, TOTAL, ANTERIOR APPROACH
Anesthesia: Spinal | Site: Hip | Laterality: Left

## 2016-10-15 MED ORDER — FENTANYL CITRATE (PF) 100 MCG/2ML IJ SOLN
INTRAMUSCULAR | Status: DC | PRN
  Administered 2016-10-15: 100 ug via INTRAVENOUS

## 2016-10-15 MED ORDER — METHOCARBAMOL 1000 MG/10ML IJ SOLN
500.0000 mg | Freq: Four times a day (QID) | INTRAVENOUS | Status: DC | PRN
  Administered 2016-10-15: 500 mg via INTRAVENOUS
  Filled 2016-10-15: qty 5
  Filled 2016-10-15: qty 550

## 2016-10-15 MED ORDER — BISACODYL 10 MG RE SUPP: 10.0000 mg | Freq: Every day | RECTAL | Status: DC | PRN

## 2016-10-15 MED ORDER — METOCLOPRAMIDE HCL 5 MG/ML IJ SOLN
5.0000 mg | Freq: Three times a day (TID) | INTRAMUSCULAR | Status: DC | PRN
  Administered 2016-10-15: 18:00:00 10 mg via INTRAVENOUS
  Filled 2016-10-15: qty 2

## 2016-10-15 MED ORDER — PHENYLEPHRINE 40 MCG/ML (10ML) SYRINGE FOR IV PUSH (FOR BLOOD PRESSURE SUPPORT)
PREFILLED_SYRINGE | INTRAVENOUS | Status: DC | PRN
  Administered 2016-10-15 (×3): 80 ug via INTRAVENOUS

## 2016-10-15 MED ORDER — DEXAMETHASONE SODIUM PHOSPHATE 10 MG/ML IJ SOLN
10.0000 mg | Freq: Once | INTRAMUSCULAR | Status: AC
  Administered 2016-10-15: 10 mg via INTRAVENOUS

## 2016-10-15 MED ORDER — POLYETHYLENE GLYCOL 3350 17 G PO PACK
17.0000 g | PACK | Freq: Two times a day (BID) | ORAL | Status: DC
  Administered 2016-10-15 – 2016-10-16 (×2): 17 g via ORAL
  Filled 2016-10-15 (×2): qty 1

## 2016-10-15 MED ORDER — PRAVASTATIN SODIUM 20 MG PO TABS
40.0000 mg | ORAL_TABLET | Freq: Every day | ORAL | Status: DC
  Administered 2016-10-15: 20:00:00 40 mg via ORAL
  Filled 2016-10-15: qty 2

## 2016-10-15 MED ORDER — DORZOLAMIDE HCL 2 % OP SOLN
1.0000 [drp] | Freq: Two times a day (BID) | OPHTHALMIC | Status: DC
  Administered 2016-10-15 – 2016-10-16 (×2): 1 [drp] via OPHTHALMIC
  Filled 2016-10-15: qty 10

## 2016-10-15 MED ORDER — TRANEXAMIC ACID 1000 MG/10ML IV SOLN
1000.0000 mg | INTRAVENOUS | Status: AC
  Administered 2016-10-15: 1000 mg via INTRAVENOUS
  Filled 2016-10-15: qty 10

## 2016-10-15 MED ORDER — EPHEDRINE 5 MG/ML INJ
INTRAVENOUS | Status: AC
  Filled 2016-10-15: qty 10

## 2016-10-15 MED ORDER — CEFAZOLIN SODIUM-DEXTROSE 2-4 GM/100ML-% IV SOLN
2.0000 g | Freq: Four times a day (QID) | INTRAVENOUS | Status: AC
  Administered 2016-10-15 – 2016-10-16 (×2): 2 g via INTRAVENOUS
  Filled 2016-10-15 (×2): qty 100

## 2016-10-15 MED ORDER — PHENYLEPHRINE 40 MCG/ML (10ML) SYRINGE FOR IV PUSH (FOR BLOOD PRESSURE SUPPORT)
PREFILLED_SYRINGE | INTRAVENOUS | Status: AC
  Filled 2016-10-15: qty 10

## 2016-10-15 MED ORDER — TIMOLOL MALEATE 0.5 % OP SOLN
1.0000 [drp] | Freq: Two times a day (BID) | OPHTHALMIC | Status: DC
  Administered 2016-10-15 – 2016-10-16 (×2): 1 [drp] via OPHTHALMIC
  Filled 2016-10-15: qty 5

## 2016-10-15 MED ORDER — BUPIVACAINE IN DEXTROSE 0.75-8.25 % IT SOLN
INTRATHECAL | Status: DC | PRN
  Administered 2016-10-15: 1.8 mL via INTRATHECAL

## 2016-10-15 MED ORDER — ALUM & MAG HYDROXIDE-SIMETH 200-200-20 MG/5ML PO SUSP: 30.0000 mL | ORAL | Status: DC | PRN

## 2016-10-15 MED ORDER — LACTATED RINGERS IV SOLN
INTRAVENOUS | Status: DC
  Administered 2016-10-15: 14:00:00 via INTRAVENOUS
  Administered 2016-10-15 (×2): 1000 mL via INTRAVENOUS
  Administered 2016-10-15: 13:00:00 via INTRAVENOUS

## 2016-10-15 MED ORDER — PROPOFOL 10 MG/ML IV BOLUS
INTRAVENOUS | Status: AC
  Filled 2016-10-15: qty 20

## 2016-10-15 MED ORDER — MENTHOL 3 MG MT LOZG: 1.0000 | LOZENGE | OROMUCOSAL | Status: DC | PRN

## 2016-10-15 MED ORDER — MAGNESIUM CITRATE PO SOLN: 1.0000 | Freq: Once | ORAL | Status: DC | PRN

## 2016-10-15 MED ORDER — FAMOTIDINE 20 MG PO TABS: 10.0000 mg | ORAL_TABLET | Freq: Every day | ORAL | Status: DC | PRN

## 2016-10-15 MED ORDER — 0.9 % SODIUM CHLORIDE (POUR BTL) OPTIME
TOPICAL | Status: DC | PRN
  Administered 2016-10-15: 1000 mL

## 2016-10-15 MED ORDER — BRIMONIDINE TARTRATE-TIMOLOL 0.2-0.5 % OP SOLN: 1.0000 [drp] | Freq: Two times a day (BID) | OPHTHALMIC | Status: DC

## 2016-10-15 MED ORDER — LIDOCAINE 2% (20 MG/ML) 5 ML SYRINGE
INTRAMUSCULAR | Status: DC | PRN
  Administered 2016-10-15: 100 mg via INTRAVENOUS

## 2016-10-15 MED ORDER — GABAPENTIN 300 MG PO CAPS
300.0000 mg | ORAL_CAPSULE | Freq: Three times a day (TID) | ORAL | Status: DC
  Administered 2016-10-15 – 2016-10-16 (×2): 300 mg via ORAL
  Filled 2016-10-15 (×2): qty 1

## 2016-10-15 MED ORDER — MIDAZOLAM HCL 2 MG/2ML IJ SOLN
INTRAMUSCULAR | Status: AC
  Filled 2016-10-15: qty 2

## 2016-10-15 MED ORDER — SODIUM CHLORIDE 0.9 % IV SOLN
100.0000 mL/h | INTRAVENOUS | Status: DC
  Administered 2016-10-15: 100 mL/h via INTRAVENOUS
  Filled 2016-10-15 (×5): qty 1000

## 2016-10-15 MED ORDER — DIPHENHYDRAMINE HCL 25 MG PO CAPS: 25.0000 mg | ORAL_CAPSULE | Freq: Four times a day (QID) | ORAL | Status: DC | PRN

## 2016-10-15 MED ORDER — MIDAZOLAM HCL 5 MG/5ML IJ SOLN
INTRAMUSCULAR | Status: DC | PRN
  Administered 2016-10-15: 2 mg via INTRAVENOUS

## 2016-10-15 MED ORDER — BRIMONIDINE TARTRATE 0.2 % OP SOLN
1.0000 [drp] | Freq: Two times a day (BID) | OPHTHALMIC | Status: DC
  Administered 2016-10-15 – 2016-10-16 (×2): 1 [drp] via OPHTHALMIC
  Filled 2016-10-15: qty 5

## 2016-10-15 MED ORDER — HYDROMORPHONE HCL 1 MG/ML IJ SOLN
0.2500 mg | INTRAMUSCULAR | Status: DC | PRN
  Administered 2016-10-15: 0.5 mg via INTRAVENOUS

## 2016-10-15 MED ORDER — ONDANSETRON HCL 4 MG PO TABS: 4.0000 mg | ORAL_TABLET | Freq: Four times a day (QID) | ORAL | Status: DC | PRN

## 2016-10-15 MED ORDER — CEFAZOLIN SODIUM-DEXTROSE 2-4 GM/100ML-% IV SOLN
INTRAVENOUS | Status: AC
  Filled 2016-10-15: qty 100

## 2016-10-15 MED ORDER — FENTANYL CITRATE (PF) 100 MCG/2ML IJ SOLN
INTRAMUSCULAR | Status: AC
  Filled 2016-10-15: qty 2

## 2016-10-15 MED ORDER — ASPIRIN 81 MG PO CHEW
81.0000 mg | CHEWABLE_TABLET | Freq: Two times a day (BID) | ORAL | Status: DC
  Administered 2016-10-15 – 2016-10-16 (×2): 81 mg via ORAL
  Filled 2016-10-15 (×2): qty 1

## 2016-10-15 MED ORDER — METOCLOPRAMIDE HCL 5 MG PO TABS: 5.0000 mg | ORAL_TABLET | Freq: Three times a day (TID) | ORAL | Status: DC | PRN

## 2016-10-15 MED ORDER — STERILE WATER FOR IRRIGATION IR SOLN
Status: DC | PRN
  Administered 2016-10-15: 2000 mL

## 2016-10-15 MED ORDER — CHLORHEXIDINE GLUCONATE 4 % EX LIQD: 60.0000 mL | Freq: Once | CUTANEOUS | Status: DC

## 2016-10-15 MED ORDER — LOSARTAN POTASSIUM 25 MG PO TABS
25.0000 mg | ORAL_TABLET | Freq: Every day | ORAL | Status: DC
  Administered 2016-10-15: 22:00:00 25 mg via ORAL
  Filled 2016-10-15: qty 1

## 2016-10-15 MED ORDER — HYDROCODONE-ACETAMINOPHEN 7.5-325 MG PO TABS
1.0000 | ORAL_TABLET | ORAL | Status: DC
  Administered 2016-10-15 – 2016-10-16 (×5): 2 via ORAL
  Filled 2016-10-15 (×6): qty 2

## 2016-10-15 MED ORDER — ALPRAZOLAM 0.25 MG PO TABS
0.1250 mg | ORAL_TABLET | Freq: Every evening | ORAL | Status: DC | PRN
  Administered 2016-10-15 – 2016-10-16 (×2): 0.25 mg via ORAL
  Filled 2016-10-15 (×2): qty 1

## 2016-10-15 MED ORDER — CELECOXIB 200 MG PO CAPS
200.0000 mg | ORAL_CAPSULE | Freq: Two times a day (BID) | ORAL | Status: DC
  Administered 2016-10-15 – 2016-10-16 (×2): 200 mg via ORAL
  Filled 2016-10-15 (×2): qty 1

## 2016-10-15 MED ORDER — CEFAZOLIN SODIUM-DEXTROSE 2-4 GM/100ML-% IV SOLN
2.0000 g | INTRAVENOUS | Status: AC
  Administered 2016-10-15: 2 g via INTRAVENOUS
  Filled 2016-10-15: qty 100

## 2016-10-15 MED ORDER — DOCUSATE SODIUM 100 MG PO CAPS
100.0000 mg | ORAL_CAPSULE | Freq: Two times a day (BID) | ORAL | Status: DC
  Administered 2016-10-15 – 2016-10-16 (×2): 100 mg via ORAL
  Filled 2016-10-15 (×2): qty 1

## 2016-10-15 MED ORDER — LIDOCAINE 2% (20 MG/ML) 5 ML SYRINGE
INTRAMUSCULAR | Status: AC
  Filled 2016-10-15: qty 5

## 2016-10-15 MED ORDER — EPHEDRINE SULFATE-NACL 50-0.9 MG/10ML-% IV SOSY
PREFILLED_SYRINGE | INTRAVENOUS | Status: DC | PRN
  Administered 2016-10-15 (×3): 10 mg via INTRAVENOUS

## 2016-10-15 MED ORDER — PHENOL 1.4 % MT LIQD: 1.0000 | OROMUCOSAL | Status: DC | PRN

## 2016-10-15 MED ORDER — HYDROMORPHONE HCL 1 MG/ML IJ SOLN
0.5000 mg | INTRAMUSCULAR | Status: DC | PRN
  Administered 2016-10-15: 1 mg via INTRAVENOUS
  Filled 2016-10-15: qty 1

## 2016-10-15 MED ORDER — ONDANSETRON HCL 4 MG/2ML IJ SOLN: 4.0000 mg | Freq: Four times a day (QID) | INTRAMUSCULAR | Status: DC | PRN

## 2016-10-15 MED ORDER — ALPRAZOLAM 0.25 MG PO TABS: 0.0625 mg | ORAL_TABLET | Freq: Every day | ORAL | Status: DC | PRN

## 2016-10-15 MED ORDER — HYDROMORPHONE HCL 1 MG/ML IJ SOLN
INTRAMUSCULAR | Status: AC
  Filled 2016-10-15: qty 0.5

## 2016-10-15 MED ORDER — FERROUS SULFATE 325 (65 FE) MG PO TABS
325.0000 mg | ORAL_TABLET | Freq: Three times a day (TID) | ORAL | Status: DC
  Administered 2016-10-15 – 2016-10-16 (×2): 325 mg via ORAL
  Filled 2016-10-15 (×2): qty 1

## 2016-10-15 MED ORDER — DEXAMETHASONE SODIUM PHOSPHATE 10 MG/ML IJ SOLN
10.0000 mg | Freq: Once | INTRAMUSCULAR | Status: AC
  Administered 2016-10-16: 09:00:00 10 mg via INTRAVENOUS
  Filled 2016-10-15: qty 1

## 2016-10-15 MED ORDER — ONDANSETRON HCL 4 MG/2ML IJ SOLN
INTRAMUSCULAR | Status: DC | PRN
  Administered 2016-10-15: 4 mg via INTRAVENOUS

## 2016-10-15 MED ORDER — METHOCARBAMOL 500 MG PO TABS
500.0000 mg | ORAL_TABLET | Freq: Four times a day (QID) | ORAL | Status: DC | PRN
  Administered 2016-10-15: 500 mg via ORAL
  Filled 2016-10-15: qty 1

## 2016-10-15 MED ORDER — PROPOFOL 500 MG/50ML IV EMUL
INTRAVENOUS | Status: DC | PRN
  Administered 2016-10-15: 75 ug/kg/min via INTRAVENOUS

## 2016-10-15 SURGICAL SUPPLY — 32 items
BLADE SAW SGTL 18X1.27X75 (BLADE) ×2 IMPLANT
BLADE SAW SGTL 18X1.27X75MM (BLADE) ×1
CAPT HIP TOTAL 2 ×3 IMPLANT
CLOTH BEACON ORANGE TIMEOUT ST (SAFETY) ×3 IMPLANT
COVER PERINEAL POST (MISCELLANEOUS) ×3 IMPLANT
DERMABOND ADVANCED (GAUZE/BANDAGES/DRESSINGS) ×2
DERMABOND ADVANCED .7 DNX12 (GAUZE/BANDAGES/DRESSINGS) ×1 IMPLANT
DRAPE STERI IOBAN 125X83 (DRAPES) ×3 IMPLANT
DRAPE U-SHAPE 47X51 STRL (DRAPES) ×6 IMPLANT
DRSG AQUACEL AG ADV 3.5X10 (GAUZE/BANDAGES/DRESSINGS) ×3 IMPLANT
DURAPREP 26ML APPLICATOR (WOUND CARE) ×3 IMPLANT
ELECT REM PT RETURN 9FT ADLT (ELECTROSURGICAL) ×3
ELECTRODE REM PT RTRN 9FT ADLT (ELECTROSURGICAL) ×1 IMPLANT
GLOVE BIOGEL PI IND STRL 7.5 (GLOVE) ×3 IMPLANT
GLOVE BIOGEL PI IND STRL 8.5 (GLOVE) ×1 IMPLANT
GLOVE BIOGEL PI INDICATOR 7.5 (GLOVE) ×6
GLOVE BIOGEL PI INDICATOR 8.5 (GLOVE) ×2
GLOVE ECLIPSE 8.0 STRL XLNG CF (GLOVE) ×6 IMPLANT
GLOVE ORTHO TXT STRL SZ7.5 (GLOVE) ×3 IMPLANT
GLOVE SURG SS PI 7.0 STRL IVOR (GLOVE) ×3 IMPLANT
GLOVE SURG SS PI 7.5 STRL IVOR (GLOVE) ×3 IMPLANT
GOWN SPEC L3 XXLG W/TWL (GOWN DISPOSABLE) ×6 IMPLANT
GOWN STRL REUS W/TWL XL LVL3 (GOWN DISPOSABLE) ×6 IMPLANT
HOLDER FOLEY CATH W/STRAP (MISCELLANEOUS) ×3 IMPLANT
PACK ANTERIOR HIP CUSTOM (KITS) ×3 IMPLANT
SUT MNCRL AB 4-0 PS2 18 (SUTURE) ×3 IMPLANT
SUT VIC AB 1 CT1 36 (SUTURE) ×9 IMPLANT
SUT VIC AB 2-0 CT1 27 (SUTURE) ×4
SUT VIC AB 2-0 CT1 TAPERPNT 27 (SUTURE) ×2 IMPLANT
SUT VLOC 180 0 24IN GS25 (SUTURE) ×3 IMPLANT
TRAY FOLEY CATH SILVER 14FR (SET/KITS/TRAYS/PACK) ×3 IMPLANT
YANKAUER SUCT BULB TIP 10FT TU (MISCELLANEOUS) ×3 IMPLANT

## 2016-10-15 NOTE — Anesthesia Postprocedure Evaluation (Signed)
Anesthesia Post Note  Patient: Whitney Pace  Procedure(s) Performed: Procedure(s) (LRB): LEFT TOTAL HIP ARTHROPLASTY ANTERIOR APPROACH (Left)  Patient location during evaluation: PACU Anesthesia Type: Spinal and MAC Level of consciousness: awake and alert Pain management: pain level controlled Vital Signs Assessment: post-procedure vital signs reviewed and stable Respiratory status: spontaneous breathing, respiratory function stable and patient connected to nasal cannula oxygen Cardiovascular status: blood pressure returned to baseline and stable Postop Assessment: spinal receding Anesthetic complications: no    Last Vitals:  Vitals:   10/15/16 1541 10/15/16 1556  BP: (!) 142/74   Pulse: 69 76  Resp: 14   Temp: 36.5 C     Last Pain:  Vitals:   10/15/16 1541  TempSrc:   PainSc: 0-No pain                 Leiby Pigeon,W. EDMOND

## 2016-10-15 NOTE — Interval H&P Note (Signed)
History and Physical Interval Note:  10/15/2016 11:44 AM  Whitney Pace  has presented today for surgery, with the diagnosis of LEFT HIP OA  The various methods of treatment have been discussed with the patient and family. After consideration of risks, benefits and other options for treatment, the patient has consented to  Procedure(s): LEFT TOTAL HIP ARTHROPLASTY ANTERIOR APPROACH (Left) as a surgical intervention .  The patient's history has been reviewed, patient examined, no change in status, stable for surgery.  I have reviewed the patient's chart and labs.  Questions were answered to the patient's satisfaction.     Mauri Pole

## 2016-10-15 NOTE — Transfer of Care (Signed)
Immediate Anesthesia Transfer of Care Note  Patient: Whitney Pace  Procedure(s) Performed: Procedure(s): LEFT TOTAL HIP ARTHROPLASTY ANTERIOR APPROACH (Left)  Patient Location: PACU  Anesthesia Type:Regional  Level of Consciousness: awake, alert  and oriented  Airway & Oxygen Therapy: Patient Spontanous Breathing and Patient connected to face mask oxygen  Post-op Assessment: Report given to RN and Post -op Vital signs reviewed and stable  Post vital signs: Reviewed and stable  Last Vitals:  Vitals:   10/15/16 0831 10/15/16 1445  BP: 105/60 118/73  Pulse: 60 73  Resp: 18 (!) 21  Temp: 36.4 C 36.9 C    Last Pain:  Vitals:   10/15/16 1127  TempSrc:   PainSc: 0-No pain      Patients Stated Pain Goal: 4 ([id number])  Complications: No apparent anesthesia complications

## 2016-10-15 NOTE — Op Note (Signed)
NAME:  Whitney Pace                ACCOUNT NO.: [id number]      MEDICAL RECORD NO.: [id number]      FACILITY:  [facility]      PHYSICIAN:  Paralee Cancel D  DATE OF BIRTH:  1946-08-05     DATE OF PROCEDURE:  10/15/2016                                 OPERATIVE REPORT         PREOPERATIVE DIAGNOSIS: Left  hip osteoarthritis.      POSTOPERATIVE DIAGNOSIS:  Left hip osteoarthritis.      PROCEDURE:  Left total hip replacement through an anterior approach   utilizing DePuy THR system, component size 40mm pinnacle cup, a size 32+4 neutral   Altrex liner, a size 5 Hi Tri Lock stem with a 32+5 delta ceramic   ball.      SURGEON:  Pietro Cassis. Alvan Dame, M.D.      ASSISTANT:  Danae Orleans, PA-C     ANESTHESIA:  Spinal.      SPECIMENS:  None.      COMPLICATIONS:  None.      BLOOD LOSS:  350 cc     DRAINS:  None.      INDICATION OF THE PROCEDURE:  Whitney Pace is a 70 y.o. female who had   presented to office for evaluation of left hip pain.  Radiographs revealed   progressive degenerative changes with bone-on-bone   articulation to the  hip joint.  The patient had painful limited range of   motion significantly affecting their overall quality of life.  The patient was failing to    respond to conservative measures, and at this point was ready   to proceed with more definitive measures.  The patient has noted progressive   degenerative changes in his hip, progressive problems and dysfunction   with regarding the hip prior to surgery.  Consent was obtained for   benefit of pain relief.  Specific risk of infection, DVT, component   failure, dislocation, need for revision surgery, as well discussion of   the anterior versus posterior approach were reviewed.  Consent was   obtained for benefit of anterior pain relief through an anterior   approach.      PROCEDURE IN DETAIL:  The patient was brought to operative theater.   Once adequate anesthesia, preoperative  antibiotics, 2 gm of Ancef, 1 gm of Tranexamic Acid, and 10 mg of Decadron administered.   The patient was positioned supine on the OSI Hanna table.  Once adequate   padding of boney process was carried out, we had predraped out the hip, and  used fluoroscopy to confirm orientation of the pelvis and position.      The left hip was then prepped and draped from proximal iliac crest to   mid thigh with shower curtain technique.      Time-out was performed identifying the patient, planned procedure, and   extremity.     An incision was then made 2 cm distal and lateral to the   anterior superior iliac spine extending over the orientation of the   tensor fascia lata muscle and sharp dissection was carried down to the   fascia of the muscle and protractor placed in the soft tissues.      The fascia  was then incised.  The muscle belly was identified and swept   laterally and retractor placed along the superior neck.  Following   cauterization of the circumflex vessels and removing some pericapsular   fat, a second cobra retractor was placed on the inferior neck.  A third   retractor was placed on the anterior acetabulum after elevating the   anterior rectus.  A L-capsulotomy was along the line of the   superior neck to the trochanteric fossa, then extended proximally and   distally.  Tag sutures were placed and the retractors were then placed   intracapsular.  We then identified the trochanteric fossa and   orientation of my neck cut, confirmed this radiographically   and then made a neck osteotomy with the femur on traction.  The femoral   head was removed without difficulty or complication.  Traction was let   off and retractors were placed posterior and anterior around the   acetabulum.      The labrum and foveal tissue were debrided.  I began reaming with a 45 mm   reamer and reamed up to 49 mm reamer with good bony bed preparation and a 12mm   cup was chosen.  The final 59mm Pinnacle  cup was then impacted under fluoroscopy  to confirm the depth of penetration and orientation with respect to   abduction.  A screw was placed followed by the hole eliminator.  The final   32+4 neutral Altrex liner was impacted with good visualized rim fit.  The cup was positioned anatomically within the acetabular portion of the pelvis.      At this point, the femur was rolled at 80 degrees.  Further capsule was   released off the inferior aspect of the femoral neck.  I then   released the superior capsule proximally.  The hook was placed laterally   along the femur and elevated manually and held in position with the bed   hook.  The leg was then extended and adducted with the leg rolled to 100   degrees of external rotation.  Once the proximal femur was fully   exposed, I used a box osteotome to set orientation.  I then began   broaching with the starting chili pepper broach and passed this by hand and then broached up to size 5.  With the size 5 broach in place I chose a high offset neck and did several trial reductions.  The offset was appropriate, leg lengths   appeared to be equal best with the +5 head ball confirmed radiographically.   Given these findings, I went ahead and dislocated the hip, repositioned all   retractors and positioned the right hip in the extended and abducted position.  The final 5 Hi Tri Lock stem was   chosen and it was impacted down to the level of neck cut.  Based on this   and the trial reduction, a 32+5 delta ceramic ball was chosen and   impacted onto a clean and dry trunnion, and the hip was reduced.  The   hip had been irrigated throughout the case again at this point.  I did   reapproximate the superior capsular leaflet to the anterior leaflet   using #1 Vicryl.  The fascia of the   tensor fascia lata muscle was then reapproximated using #1 Vicryl and #0 V-lock sutures.  The   remaining wound was closed with 2-0 Vicryl and running 4-0 Monocryl.   The hip  was cleaned, dried, and dressed sterilely using Dermabond and   Aquacel dressing.  She was then brought   to recovery room in stable condition tolerating the procedure well.    Danae Orleans, PA-C was present for the entirety of the case involved from   preoperative positioning, perioperative retractor management, general   facilitation of the case, as well as primary wound closure as assistant.            Pietro Cassis Alvan Dame, M.D.        10/15/2016 2:26 PM

## 2016-10-15 NOTE — Discharge Instructions (Signed)

## 2016-10-15 NOTE — Anesthesia Preprocedure Evaluation (Addendum)
Anesthesia Evaluation  Patient identified by MRN, date of birth, ID band Patient awake    Reviewed: Allergy & Precautions, H&P , NPO status , Patient's Chart, lab work & pertinent test results  Airway Mallampati: II  TM Distance: >3 FB Neck ROM: Full    Dental no notable dental hx. (+) Teeth Intact, Dental Advisory Given   Pulmonary asthma ,    Pulmonary exam normal breath sounds clear to auscultation       Cardiovascular hypertension, Pt. on medications  Rhythm:Regular Rate:Normal     Neuro/Psych Anxiety negative neurological ROS  negative psych ROS   GI/Hepatic Neg liver ROS, GERD  Medicated and Controlled,  Endo/Other  negative endocrine ROS  Renal/GU negative Renal ROS  negative genitourinary   Musculoskeletal  (+) Arthritis , Osteoarthritis,    Abdominal   Peds  Hematology negative hematology ROS (+)   Anesthesia Other Findings   Reproductive/Obstetrics negative OB ROS                            Anesthesia Physical Anesthesia Plan  ASA: II  Anesthesia Plan: Spinal   Post-op Pain Management:    Induction: Intravenous  Airway Management Planned: Simple Face Mask  Additional Equipment:   Intra-op Plan:   Post-operative Plan:   Informed Consent: I have reviewed the patients History and Physical, chart, labs and discussed the procedure including the risks, benefits and alternatives for the proposed anesthesia with the patient or authorized representative who has indicated his/her understanding and acceptance.   Dental advisory given  Plan Discussed with: CRNA  Anesthesia Plan Comments:         Anesthesia Quick Evaluation

## 2016-10-15 NOTE — Anesthesia Procedure Notes (Signed)
Spinal  Patient location during procedure: OR End time: 10/15/2016 12:58 PM Staffing Resident/CRNA: Noralyn Pick D Performed: anesthesiologist and resident/CRNA  Preanesthetic Checklist Completed: patient identified, site marked, surgical consent, pre-op evaluation, timeout performed, IV checked, risks and benefits discussed and monitors and equipment checked Spinal Block Patient position: sitting Prep: Betadine Patient monitoring: heart rate, continuous pulse ox and blood pressure Approach: right paramedian Location: L2-3 Injection technique: single-shot Needle Needle type: Sprotte  Needle gauge: 24 G Needle length: 9 cm Assessment Sensory level: T6 Additional Notes Expiration date of kit checked and confirmed. Patient tolerated procedure well, without complications.

## 2016-10-16 ENCOUNTER — Encounter (HOSPITAL_COMMUNITY): Payer: Self-pay | Admitting: Orthopedic Surgery

## 2016-10-16 LAB — CBC
HEMATOCRIT: 28.9 % — AB (ref 36.0–46.0)
Hemoglobin: 9.9 g/dL — ABNORMAL LOW (ref 12.0–15.0)
MCH: 31.8 pg (ref 26.0–34.0)
MCHC: 34.3 g/dL (ref 30.0–36.0)
MCV: 92.9 fL (ref 78.0–100.0)
PLATELETS: 241 10*3/uL (ref 150–400)
RBC: 3.11 MIL/uL — ABNORMAL LOW (ref 3.87–5.11)
RDW: 12.8 % (ref 11.5–15.5)
WBC: 9.9 10*3/uL (ref 4.0–10.5)

## 2016-10-16 LAB — BASIC METABOLIC PANEL
Anion gap: 5 (ref 5–15)
BUN: 12 mg/dL (ref 6–20)
CO2: 26 mmol/L (ref 22–32)
CREATININE: 0.66 mg/dL (ref 0.44–1.00)
Calcium: 8.4 mg/dL — ABNORMAL LOW (ref 8.9–10.3)
Chloride: 103 mmol/L (ref 101–111)
GLUCOSE: 182 mg/dL — AB (ref 65–99)
Potassium: 4.4 mmol/L (ref 3.5–5.1)
Sodium: 134 mmol/L — ABNORMAL LOW (ref 135–145)

## 2016-10-16 MED ORDER — ASPIRIN 81 MG PO CHEW: 81.0000 mg | CHEWABLE_TABLET | Freq: Two times a day (BID) | ORAL | 0 refills | Status: AC

## 2016-10-16 MED ORDER — DOCUSATE SODIUM 100 MG PO CAPS: 100.0000 mg | ORAL_CAPSULE | Freq: Two times a day (BID) | ORAL | 0 refills | Status: DC

## 2016-10-16 MED ORDER — HYDROCODONE-ACETAMINOPHEN 7.5-325 MG PO TABS: 1.0000 | ORAL_TABLET | ORAL | 0 refills | Status: DC | PRN

## 2016-10-16 MED ORDER — METHOCARBAMOL 500 MG PO TABS: 500.0000 mg | ORAL_TABLET | Freq: Four times a day (QID) | ORAL | 0 refills | Status: DC | PRN

## 2016-10-16 MED ORDER — FERROUS SULFATE 325 (65 FE) MG PO TABS: 325.0000 mg | ORAL_TABLET | Freq: Three times a day (TID) | ORAL | 3 refills | Status: DC

## 2016-10-16 MED ORDER — POLYETHYLENE GLYCOL 3350 17 G PO PACK: 17.0000 g | PACK | Freq: Two times a day (BID) | ORAL | 0 refills | Status: DC

## 2016-10-16 NOTE — Care Management Note (Signed)
Case Management Note  Patient Details  Name: Whitney Pace MRN: [id number] Date of Birth: 1946-07-05  Subjective/Objective:                  LEFT TOTAL HIP ARTHROPLASTY ANTERIOR APPROACH (Left) Action/Plan: Discharge planning Expected Discharge Date:  10/17/16               Expected Discharge Plan:  Home/Self Care  In-House Referral:     Discharge planning Services  CM Consult  Post Acute Care Choice:  NA Choice offered to:  NA  DME Arranged:  N/A DME Agency:  NA  HH Arranged:  NA HH Agency:  NA  Status of Service:  Completed, signed off  If discussed at [place] of Stay Meetings, dates discussed:    Additional Comments: CM met with pt in room to confirm plan is for no home health services; pt confirms.  Pt also states she has all DMe needed at home.  No other CM needs were communicated. Dellie Catholic, RN 10/16/2016, 1:15 PM

## 2016-10-16 NOTE — Evaluation (Signed)
Occupational Therapy Evaluation Patient Details Name: Whitney Pace MRN: [id number] DOB: 1946/07/24 Today's Date: 10/16/2016    History of Present Illness s/p L THA, DA   Clinical Impression   This 70 year old female was admitted for the above sx. She will benefit from one more OT session prior to discharge as this was her first time out of bed. Goals are for min guard for bathroom transfers.    Follow Up Recommendations  Supervision/Assistance - 24 hour    Equipment Recommendations  None recommended by OT    Recommendations for Other Services       Precautions / Restrictions Precautions Precautions: Fall Restrictions LLE Weight Bearing: Weight bearing as tolerated      Mobility Bed Mobility Overal bed mobility: Needs Assistance Bed Mobility: Supine to Sit     Supine to sit: Min assist     General bed mobility comments: moved both legs together with assistance; cues for sequence  Transfers Overall transfer level: Needs assistance Equipment used: Rolling walker (2 wheeled) Transfers: Sit to/from Stand Sit to Stand: Min assist         General transfer comment: assist to rise and steady.  Cues for UE/Le placement    Balance                                            ADL Overall ADL's : Needs assistance/impaired             Lower Body Bathing: Minimal assistance;Sit to/from stand       Lower Body Dressing: Moderate assistance;Sit to/from stand (min A for underwear with reacher)                 General ADL Comments: ambulated around bed to chair.  Pt had increased pain when getting to EOB; did better when both legs were moved together. She can perform UB adls with setup and husband will assist as needed with LB adls.  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 [employer]      Pertinent Vitals/Pain Pain Assessment: 0-10 Pain Score: 4  Pain Location: l hip Pain Descriptors / Indicators:  Aching Pain Intervention(s): Limited activity within patient's tolerance;Monitored during session;Premedicated before session;Repositioned;Ice applied     Hand Dominance     Extremity/Trunk Assessment Upper Extremity Assessment Upper Extremity Assessment: Overall WFL for tasks assessed           Communication Communication Communication: No difficulties   Cognition Arousal/Alertness: Awake/alert Behavior During Therapy: WFL for tasks assessed/performed Overall Cognitive Status: Within Functional Limits for tasks assessed                     General Comments       Exercises       Shoulder Instructions      Home Living Family/patient expects to be discharged to:: Private residence Living Arrangements: Spouse/significant other Available Help at Discharge: Family               Bathroom Shower/Tub: Walk-in Psychologist, prison and probation services: Standard     Home Equipment: Toilet riser;Shower seat          Prior Functioning/Environment Level of Independence: Independent                 OT  Problem List: Decreased strength;Decreased activity tolerance;Pain;Decreased knowledge of use of DME or AE   OT Treatment/Interventions:      OT Goals(Current goals can be found in the care plan section) Acute Rehab OT Goals Patient Stated Goal: home OT Goal Formulation: With patient Time For Goal Achievement: 10/23/16 Potential to Achieve Goals: Good ADL Goals Pt Will Transfer to Toilet: with min guard assist;bedside commode;ambulating Pt Will Perform Tub/Shower Transfer: with min guard assist;ambulating;Shower transfer;shower seat  OT Frequency:     Barriers to D/C:            Co-evaluation              End of Session    Activity Tolerance: Patient tolerated treatment well Patient left: in chair;with call bell/phone within reach;with chair alarm set;with family/visitor present   Time: [id number] OT Time Calculation (min): 31 min Charges:  OT  General Charges $OT Visit: 1 Procedure OT Evaluation $OT Eval Low Complexity: 1 Procedure OT Treatments $Self Care/Home Management : 8-22 mins G-Codes:    Matix Henshaw 10/17/2016, 9:20 AM Lesle Chris, OTR/L [phone] 10-17-2016

## 2016-10-16 NOTE — Progress Notes (Signed)
   10/16/16 1541  PT Visit Information  Last PT Received On 10/16/16  Assistance Needed +1  History of Present Illness s/p L DA THA  Subjective Data  Subjective Pt performed LE exercises and HEP was reviewed with pt and spouse.  Pt ambulated again and practiced safe stair technique.  Pt feels ready for d/c home today.  Precautions  Precautions Fall  Restrictions  LLE Weight Bearing WBAT  Pain Assessment  Pain Assessment 0-10  Pain Score 4  Pain Location L hip  Pain Descriptors / Indicators Aching;Sore  Pain Intervention(s) Limited activity within patient's tolerance;Monitored during session;Ice applied;Repositioned  Cognition  Arousal/Alertness Awake/alert  Behavior During Therapy WFL for tasks assessed/performed  Overall Cognitive Status Within Functional Limits for tasks assessed  Transfers  Overall transfer level Needs assistance  Equipment used Rolling walker (2 wheeled)  Transfers Sit to/from Stand  Sit to Stand Supervision  General transfer comment verbal cues for UE and LE positioning  Ambulation/Gait  Ambulation/Gait assistance Min guard  Ambulation Distance (Feet) 40 Feet  Assistive device Rolling walker (2 wheeled)  Gait Pattern/deviations Step-to pattern;Antalgic;Decreased stance time - left  General Gait Details verbal cues for sequence, RW positioning, step length  Stairs Yes  Stairs assistance Min guard  Stair Management One rail Left;Step to pattern;Sideways  Number of Stairs 2  General stair comments verbal cues for sequence and safety, pt performed twice and demonstrates understanding  Exercises  Exercises Total Joint  Total Joint Exercises  Ankle Circles/Pumps AROM;10 reps;Both  Short Arc Quad AROM;Left;10 reps  Heel Slides AAROM;Left;10 reps  Hip ABduction/ADduction Left;10 reps;AAROM  Long Arc Quad AROM;Left;10 reps  Marching in Standing AROM;Left;5 reps  Standing Hip Extension AROM;Left;5 reps  PT - End of Session  Equipment Utilized During  Treatment Gait belt  Activity Tolerance Patient tolerated treatment well  Patient left in chair;with call bell/phone within reach  PT - Assessment/Plan  PT Plan Current plan remains appropriate  PT Frequency (ACUTE ONLY) 7X/week  Follow Up Recommendations No PT follow up (no f/u per surgeon, would benefit from [place])  PT equipment Rolling walker with 5" wheels  PT Goal Progression  Progress towards PT goals Progressing toward goals  PT Time Calculation  PT Start Time (ACUTE ONLY) 1508  PT Stop Time (ACUTE ONLY) 1533  PT Time Calculation (min) (ACUTE ONLY) 25 min  PT General Charges  $$ ACUTE PT VISIT 1 Procedure  PT Treatments  $Gait Training 8-22 mins  $Therapeutic Exercise 8-22 mins   Carmelia Bake, PT, DPT 10/16/2016 Pager: [phone]

## 2016-10-16 NOTE — Progress Notes (Signed)
   10/16/16 1400  OT Visit Information  Last OT Received On 10/16/16  Assistance Needed +1  History of Present Illness s/p L DA THA  Precautions  Precautions Fall  Pain Assessment  Pain Score 4  Pain Location L hip  Pain Descriptors / Indicators Aching  Pain Intervention(s) Limited activity within patient's tolerance;Monitored during session;Premedicated before session;Repositioned  Cognition  Arousal/Alertness Awake/alert  Behavior During Therapy WFL for tasks assessed/performed  Overall Cognitive Status Within Functional Limits for tasks assessed  ADL  Toilet Transfer Min guard;Ambulation;BSC;RW  Toileting- Technical sales engineer;Sit to/from stand  Tub/ IT consultant shower;Min guard;Ambulation  General ADL Comments husband stepped out of room.  Daughter observed shower transfer, then I demonstrated for husband.  Handout given  Bed Mobility  Supine to sit Min assist  General bed mobility comments assist for bil LEs; no rails  Restrictions  LLE Weight Bearing WBAT  Transfers  Equipment used Rolling walker (2 wheeled)  Sit to Stand Supervision  General transfer comment verbal cues for UE and LE positioning  OT - End of Session  Activity Tolerance Patient tolerated treatment well  Patient left in chair;with call bell/phone within reach;with chair alarm set;with family/visitor present  OT Goal Progression  Progress towards OT goals Goals met/education completed, patient discharged from OT  OT Time Calculation  OT Start Time (ACUTE ONLY) 1338  OT Stop Time (ACUTE ONLY) 1358  OT Time Calculation (min) 20 min  OT General Charges  $OT Visit 1 Procedure  OT Treatments  $Self Care/Home Management  8-22 mins  Lesle Chris, OTR/L [phone] 10/16/2016

## 2016-10-16 NOTE — Progress Notes (Signed)
Patient ID: Whitney Pace, female   DOB: [birth year], 70 y.o.   MRN: [id number]  Subjective: 1 Day Post-Op Procedure(s) (LRB): LEFT TOTAL HIP ARTHROPLASTY ANTERIOR APPROACH (Left)    Patient reports pain as mild. She reports being anxious last night but better this am.  Felt a bit confined.  Ready to progress.  Objective:   VITALS:   Vitals:   10/16/16 0221 10/16/16 0516  BP: (!) 94/59 (!) 104/49  Pulse: 76 77  Resp: 16 16  Temp: 97.5 F (36.4 C) 97.5 F (36.4 C)    Neurovascular intact Incision: dressing C/D/I  LABS  Recent Labs  10/16/16 0512  HGB 9.9*  HCT 28.9*  WBC 9.9  PLT 241     Recent Labs  10/16/16 0512  NA 134*  K 4.4  BUN 12  CREATININE 0.66  GLUCOSE 182*    No results for input(s): LABPT, INR in the last 72 hours.   Assessment/Plan: 1 Day Post-Op Procedure(s) (LRB): LEFT TOTAL HIP ARTHROPLASTY ANTERIOR APPROACH (Left)   Advance diet Up with therapy  D/C home with needed equipment, therapy exercises per PT here in hospital RTC in 2 weeks

## 2016-10-16 NOTE — Evaluation (Signed)
Physical Therapy Evaluation Patient Details Name: Whitney Pace MRN: [id number] DOB: [birth year] Today's Date: 10/16/2016   History of Present Illness  s/p L DA THA  Clinical Impression  Pt is s/p THA resulting in the deficits listed below (see PT Problem List).  Pt will benefit from skilled PT to increase their independence and safety with mobility to allow discharge to the venue listed below.  Pt ambulated in hallway and reports feeling a little better since first time up this morning.  Pt plans to d/c home.  Plan to return to review HEP and steps prior to d/c.     Follow Up Recommendations No PT follow up (per surgeon, would benefit from [place])    Equipment Recommendations  Rolling walker with 5" wheels    Recommendations for Other Services       Precautions / Restrictions Precautions Precautions: Fall Restrictions Weight Bearing Restrictions: No LLE Weight Bearing: Weight bearing as tolerated      Mobility  Bed Mobility Overal bed mobility: Needs Assistance Bed Mobility: Supine to Sit     Supine to sit: Min assist     General bed mobility comments: pt up in recliner on arrival   Transfers Overall transfer level: Needs assistance Equipment used: Rolling walker (2 wheeled) Transfers: Sit to/from Stand Sit to Stand: Min guard         General transfer comment: verbal cues for UE and LE positioning  Ambulation/Gait Ambulation/Gait assistance: Min guard Ambulation Distance (Feet): 40 Feet Assistive device: Rolling walker (2 wheeled) Gait Pattern/deviations: Step-to pattern;Antalgic;Decreased stance time - left     General Gait Details: verbal cues for sequence, RW positioning, step length  Stairs            Wheelchair Mobility    Modified Rankin (Stroke Patients Only)       Balance                                             Pertinent Vitals/Pain Pain Assessment: 0-10 Pain Score: 5  Pain Location: L hip Pain Descriptors /  Indicators: Aching;Sore Pain Intervention(s): Limited activity within patient's tolerance;Monitored during session;Repositioned;Premedicated before session    Whitney Pace expects to be discharged to:: Private residence Living Arrangements: Spouse/significant other Available Help at Discharge: Family Type of Home: House Home Access: Stairs to enter   Technical brewer of Steps: 2 Home Layout: Able to live on main level with bedroom/bathroom Home Equipment: Toilet riser;Shower seat;Walker - 2 wheels      Prior Function Level of Independence: Independent               Hand Dominance        Extremity/Trunk Assessment   Upper Extremity Assessment: Overall WFL for tasks assessed           Lower Extremity Assessment: LLE deficits/detail   LLE Deficits / Details: anticipated post op hip weakness     Communication   Communication: No difficulties  Cognition Arousal/Alertness: Awake/alert Behavior During Therapy: WFL for tasks assessed/performed Overall Cognitive Status: Within Functional Limits for tasks assessed                      General Comments      Exercises Total Joint Exercises Ankle Circles/Pumps: AROM;10 reps;Both Quad Sets: AROM;Left;10 reps   Assessment/Plan    PT Assessment Patient needs continued PT services  PT Problem List Decreased strength;Decreased range of motion;Decreased knowledge of use of DME;Decreased mobility;Pain          PT Treatment Interventions Functional mobility training;Stair training;Gait training;DME instruction;Therapeutic activities;Therapeutic exercise;Patient/family education    PT Goals (Current goals can be found in the Care Plan section)  Acute Rehab PT Goals Patient Stated Goal: home PT Goal Formulation: With patient/family Time For Goal Achievement: 10/20/16 Potential to Achieve Goals: Good    Frequency 7X/week   Barriers to discharge        Co-evaluation                End of Session Equipment Utilized During Treatment: Gait belt Activity Tolerance: Patient tolerated treatment well Patient left: in chair;with call bell/phone within reach;with chair alarm set;with family/visitor present           Time: [id number] PT Time Calculation (min) (ACUTE ONLY): 18 min   Charges:   PT Evaluation $PT Eval Low Complexity: 1 Procedure     PT G Codes:        Jonatha Gagen,KATHrine E 10/16/2016, 12:47 PM Carmelia Bake, PT, DPT 10/16/2016 Pager: [phone]

## 2016-10-19 NOTE — Discharge Summary (Signed)
Physician Discharge Summary  Patient ID: Whitney Pace MRN: [id number] DOB/AGE: May 21, 1946 70 y.o.  Admit date: 10/15/2016 Discharge date: 10/16/2016   Procedures:  Procedure(s) (LRB): LEFT TOTAL HIP ARTHROPLASTY ANTERIOR APPROACH (Left)  Attending Physician:  Dr. Paralee Cancel   Admission Diagnoses:   Left hip primary OA / pain  Discharge Diagnoses:  Principal Problem:   S/P left THA, AA  Past Medical History:  Diagnosis Date  . Anxiety   . Arthritis   . Asthma    as a child  . Cancer ([place])    uterine  . Cancer ([place])    skin  . GERD (gastroesophageal reflux disease)   . Glaucoma   . Heart murmur   . Hypertension   . MVP (mitral valve prolapse)    with repair 2008 at [facility]    HPI:    Whitney Pace, 70 y.o. female, has a history of pain and functional disability in the left hip(s) due to arthritis and patient has failed non-surgical conservative treatments for greater than 12 weeks to include NSAID's and/or analgesics, corticosteriod injections and activity modification.  Onset of symptoms was gradual starting 2+ years ago with gradually worsening course since that time.The patient noted no past surgery on the left hip(s).  Patient currently rates pain in the left hip at 8 out of 10 with activity. Patient has night pain, worsening of pain with activity and weight bearing, trendelenberg gait, pain that interfers with activities of daily living and pain with passive range of motion. Patient has evidence of periarticular osteophytes and joint space narrowing by imaging studies. This condition presents safety issues increasing the risk of falls. There is no current active infection.   Risks, benefits and expectations were discussed with the patient.  Risks including but not limited to the risk of anesthesia, blood clots, nerve damage, blood vessel damage, failure of the prosthesis, infection and up to and including death.  Patient understand the risks, benefits and expectations and  wishes to proceed with surgery.  PCP: Rusty Aus, MD   Discharged Condition: good  Hospital Course:  Patient underwent the above stated procedure on 10/15/2016. Patient tolerated the procedure well and brought to the recovery room in good condition and subsequently to the floor.  POD #1 BP: 104/49 ; Pulse: 77 ; Temp: 97.5 F (36.4 C) ; Resp: 16 Patient reports pain as mild. She reports being anxious last night but better this am.  Felt a bit confined.  Ready to progress. Neurovascular intact and incision: dressing C/D/I.  LABS  Basename    HGB     9.9  HCT     28.9     Discharge Exam: General appearance: alert, cooperative and no distress Extremities: Homans sign is negative, no sign of DVT, no edema, redness or tenderness in the calves or thighs and no ulcers, gangrene or trophic changes  Disposition: Home with follow up in 2 weeks   Follow-up Information    Mauri Pole, MD. Schedule an appointment as soon as possible for a visit in 2 week(s).   Specialty:  Orthopedic Surgery Contact information: [address] [place] [address] [phone]           Discharge Instructions    Call MD / Call 911    Complete by:  As directed    If you experience chest pain or shortness of breath, CALL 911 and be transported to the hospital emergency room.  If you develope a fever above  23 F, pus (white drainage) or increased drainage or redness at the wound, or calf pain, call your surgeon's office.   Change dressing    Complete by:  As directed    Maintain surgical dressing until follow up in the clinic. If the edges start to pull up, may reinforce with tape. If the dressing is no longer working, may remove and cover with gauze and tape, but must keep the area dry and clean.  Call with any questions or concerns.   Constipation Prevention    Complete by:  As directed    Drink plenty of fluids.  Prune juice may be helpful.  You may use a stool softener, such  as Colace (over the counter) 100 mg twice a day.  Use MiraLax (over the counter) for constipation as needed.   Diet - low sodium heart healthy    Complete by:  As directed    Discharge instructions    Complete by:  As directed    Maintain surgical dressing until follow up in the clinic. If the edges start to pull up, may reinforce with tape. If the dressing is no longer working, may remove and cover with gauze and tape, but must keep the area dry and clean.  Follow up in 2 weeks at [facility]. Call with any questions or concerns.   Increase activity slowly as tolerated    Complete by:  As directed    Weight bearing as tolerated with assist device (walker, cane, etc) as directed, use it as long as suggested by your surgeon or therapist, typically at least 4-6 weeks.   TED hose    Complete by:  As directed    Use stockings (TED hose) for 2 weeks on both leg(s).  You may remove them at night for sleeping.      Allergies as of 10/16/2016      Reactions   Atropine Other (See Comments)   Increased blood pressure   Promethazine Other (See Comments)   anxiety   Tramadol Nausea And Vomiting   Patient also states flushing and throat was hoarse      Medication List    STOP taking these medications   aspirin EC 325 MG tablet Replaced by:  aspirin 81 MG chewable tablet   traMADol 50 MG tablet Commonly known as:  ULTRAM     TAKE these medications   ALPRAZolam 0.25 MG tablet Commonly known as:  XANAX Take 0.0625-0.25 mg by mouth daily as needed for anxiety.   aspirin 81 MG chewable tablet Chew 1 tablet (81 mg total) by mouth 2 (two) times daily. Take for 4 weeks, then resume regular dose. Replaces:  aspirin EC 325 MG tablet   CALCIUM-VITAMIN D PO Take 1 tablet by mouth daily.   COMBIGAN 0.2-0.5 % ophthalmic solution Generic drug:  brimonidine-timolol Place 1 drop into both eyes every 12 (twelve) hours.   diclofenac 50 MG tablet Commonly known as:  CATAFLAM Take 50  mg by mouth 2 (two) times daily.   docusate sodium 100 MG capsule Commonly known as:  COLACE Take 1 capsule (100 mg total) by mouth 2 (two) times daily.   dorzolamide 2 % ophthalmic solution Commonly known as:  TRUSOPT Place 1 drop into the left eye 2 (two) times daily.   ferrous sulfate 325 (65 FE) MG tablet Take 1 tablet (325 mg total) by mouth 3 (three) times daily after meals.   gabapentin 300 MG capsule Commonly known as:  NEURONTIN Take 300 mg by  mouth 3 (three) times daily.   HYDROcodone-acetaminophen 7.5-325 MG tablet Commonly known as:  NORCO Take 1-2 tablets by mouth every 4 (four) hours as needed for moderate pain.   losartan 50 MG tablet Commonly known as:  COZAAR Take 25 mg by mouth at bedtime.   lovastatin 40 MG tablet Commonly known as:  MEVACOR Take 40 mg by mouth at bedtime.   magnesium oxide 400 MG tablet Commonly known as:  MAG-OX Take 400 mg by mouth 2 (two) times daily.   methocarbamol 500 MG tablet Commonly known as:  ROBAXIN Take 1 tablet (500 mg total) by mouth every 6 (six) hours as needed for muscle spasms.   multivitamin with minerals Tabs tablet Take 0.5 tablets by mouth 2 (two) times daily.   polyethylene glycol packet Commonly known as:  MIRALAX / GLYCOLAX Take 17 g by mouth 2 (two) times daily.   ranitidine 75 MG tablet Commonly known as:  ZANTAC Take 75 mg by mouth daily as needed for heartburn.   vitamin C 500 MG tablet Commonly known as:  ASCORBIC ACID Take 500 mg by mouth daily.        Signed: West Pugh. Majesta Leichter   PA-C  10/19/2016, 7:44 AM

## 2017-02-06 IMAGING — CR DG CHEST 2V
1 series · 2 of 2 positions shown · non-contrast
Comparison: Chest x-ray of 06/29/2011 and 02/20/2011

CLINICAL DATA: Some nausea and dizziness, began new medication last
night, scheduled for hip replacement next week

EXAM:
CHEST  2 VIEW

[Series 1: dg chest 2 view · 0.14mm/px · 2 of 2 slices shown]
[im 1/2]
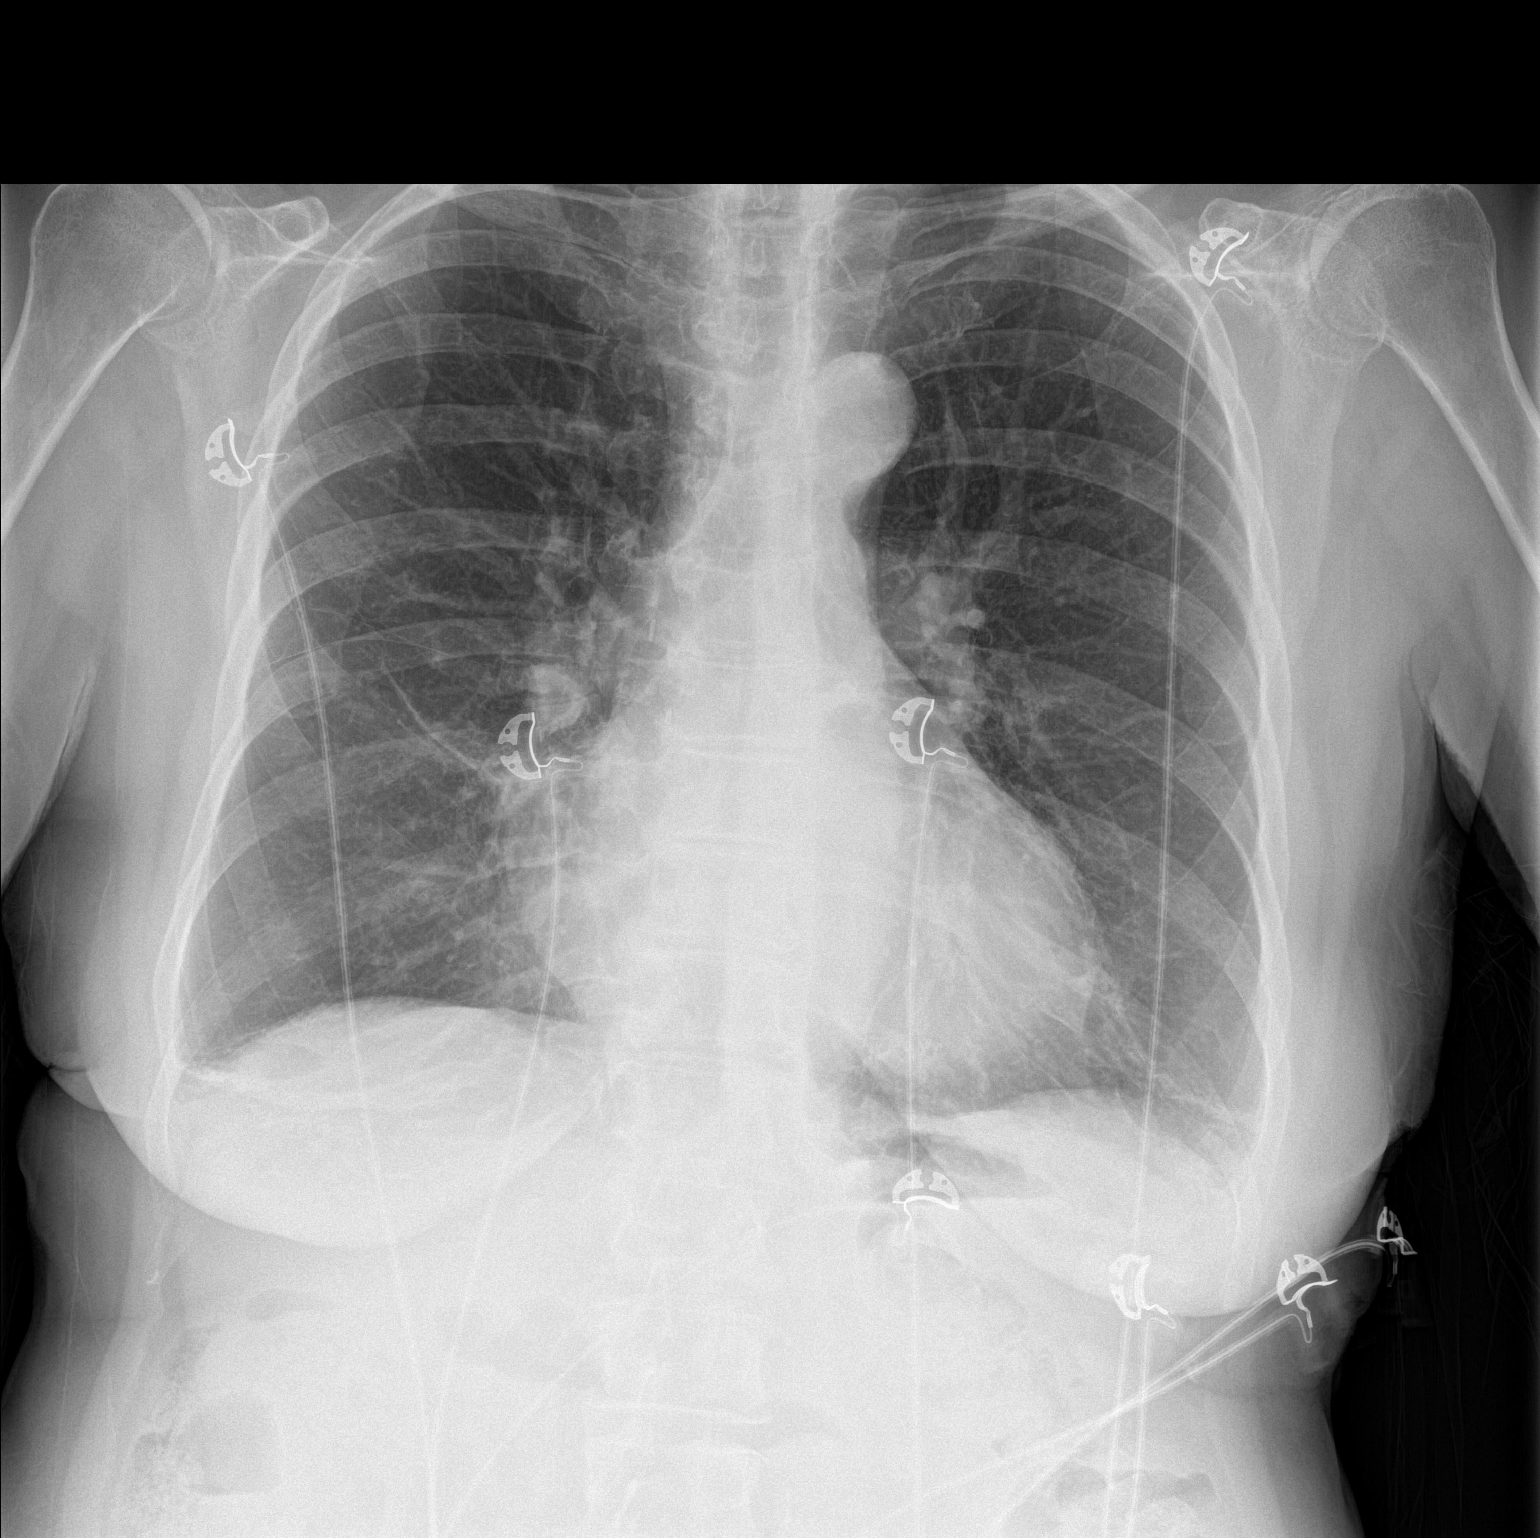
[im 2/2]
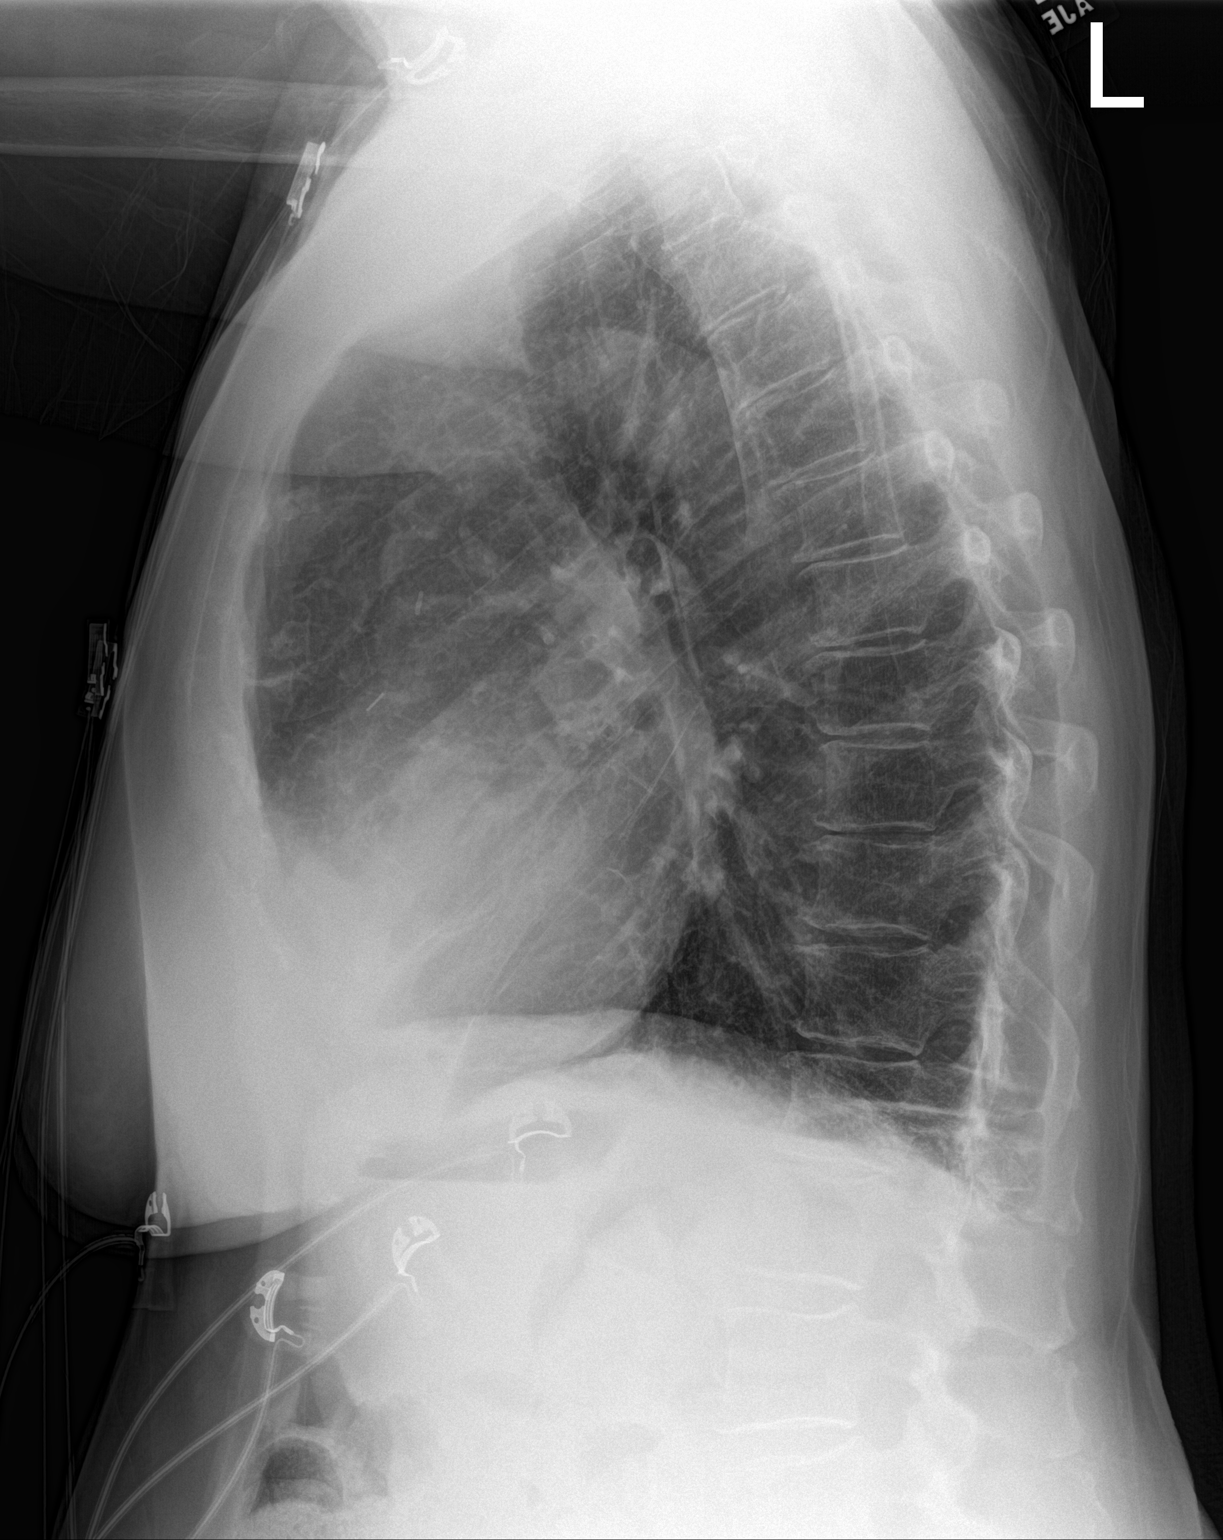

[2 of 2 positions shown; findings below may reference images not displayed]

FINDINGS: The lungs are somewhat hyperaerated. There are linear opacities at
both lung bases consistent with either linear atelectasis or
scarring. No pneumonia or effusion is seen. Mediastinal and hilar
contours are unremarkable. Mild cardiomegaly is stable. No bony
abnormality is seen.
IMPRESSION: 1. Bibasilar linear atelectasis or scarring.
2. Hyper aeration.  No pneumonia or effusion.

## 2017-05-03 ENCOUNTER — Other Ambulatory Visit: Payer: Self-pay | Admitting: Internal Medicine

## 2017-05-03 DIAGNOSIS — Z1231 Encounter for screening mammogram for malignant neoplasm of breast: Secondary | ICD-10-CM

## 2017-06-19 ENCOUNTER — Ambulatory Visit
Admission: RE | Admit: 2017-06-19 | Discharge: 2017-06-19 | Disposition: A | Discharge status: Home / Self Care | Payer: Medicare Other | Source: Ambulatory Visit | Attending: Internal Medicine | Admitting: Internal Medicine

## 2017-06-19 DIAGNOSIS — Z1231 Encounter for screening mammogram for malignant neoplasm of breast: Secondary | ICD-10-CM | POA: Diagnosis not present

## 2017-12-20 ENCOUNTER — Other Ambulatory Visit: Payer: Self-pay | Admitting: Internal Medicine

## 2017-12-20 DIAGNOSIS — R519 Headache, unspecified: Secondary | ICD-10-CM

## 2017-12-20 DIAGNOSIS — G609 Hereditary and idiopathic neuropathy, unspecified: Secondary | ICD-10-CM | POA: Insufficient documentation

## 2017-12-20 DIAGNOSIS — R51 Headache: Principal | ICD-10-CM

## 2017-12-20 DIAGNOSIS — I671 Cerebral aneurysm, nonruptured: Secondary | ICD-10-CM

## 2017-12-30 ENCOUNTER — Ambulatory Visit: Payer: Medicare Other

## 2018-01-01 ENCOUNTER — Ambulatory Visit
Admission: RE | Admit: 2018-01-01 | Discharge: 2018-01-01 | Disposition: A | Discharge status: Home / Self Care | Payer: Medicare Other | Source: Ambulatory Visit | Attending: Internal Medicine | Admitting: Internal Medicine

## 2018-01-01 DIAGNOSIS — E782 Mixed hyperlipidemia: Secondary | ICD-10-CM | POA: Diagnosis present

## 2018-01-01 DIAGNOSIS — R519 Headache, unspecified: Secondary | ICD-10-CM

## 2018-01-01 DIAGNOSIS — I671 Cerebral aneurysm, nonruptured: Secondary | ICD-10-CM | POA: Insufficient documentation

## 2018-01-01 DIAGNOSIS — R51 Headache: Secondary | ICD-10-CM | POA: Insufficient documentation

## 2018-01-05 ENCOUNTER — Emergency Department: Payer: Medicare Other

## 2018-01-05 ENCOUNTER — Encounter: Payer: Self-pay | Admitting: Emergency Medicine

## 2018-01-05 DIAGNOSIS — J45909 Unspecified asthma, uncomplicated: Secondary | ICD-10-CM | POA: Diagnosis not present

## 2018-01-05 DIAGNOSIS — I10 Essential (primary) hypertension: Secondary | ICD-10-CM | POA: Insufficient documentation

## 2018-01-05 DIAGNOSIS — Z8541 Personal history of malignant neoplasm of cervix uteri: Secondary | ICD-10-CM | POA: Insufficient documentation

## 2018-01-05 DIAGNOSIS — Z79899 Other long term (current) drug therapy: Secondary | ICD-10-CM | POA: Diagnosis not present

## 2018-01-05 DIAGNOSIS — Z96642 Presence of left artificial hip joint: Secondary | ICD-10-CM | POA: Diagnosis not present

## 2018-01-05 DIAGNOSIS — Z85828 Personal history of other malignant neoplasm of skin: Secondary | ICD-10-CM | POA: Insufficient documentation

## 2018-01-05 DIAGNOSIS — R200 Anesthesia of skin: Secondary | ICD-10-CM | POA: Diagnosis not present

## 2018-01-05 DIAGNOSIS — R202 Paresthesia of skin: Secondary | ICD-10-CM | POA: Diagnosis present

## 2018-01-05 LAB — BASIC METABOLIC PANEL
Anion gap: 9 (ref 5–15)
BUN: 13 mg/dL (ref 6–20)
CALCIUM: 9.3 mg/dL (ref 8.9–10.3)
CO2: 26 mmol/L (ref 22–32)
CREATININE: 0.83 mg/dL (ref 0.44–1.00)
Chloride: 103 mmol/L (ref 101–111)
GFR calc non Af Amer: >60 mL/min (ref >60)
GLUCOSE: 124 mg/dL — AB (ref 65–99)
Potassium: 4 mmol/L (ref 3.5–5.1)
Sodium: 138 mmol/L (ref 135–145)

## 2018-01-05 LAB — CBC
HCT: 42.5 % (ref 35.0–47.0)
Hemoglobin: 14.2 g/dL (ref 12.0–16.0)
MCH: 30.7 pg (ref 26.0–34.0)
MCHC: 33.4 g/dL (ref 32.0–36.0)
MCV: 91.9 fL (ref 80.0–100.0)
PLATELETS: 202 10*3/uL (ref 150–440)
RBC: 4.63 MIL/uL (ref 3.80–5.20)
RDW: 13.2 % (ref 11.5–14.5)
WBC: 4.2 10*3/uL (ref 3.6–11.0)

## 2018-01-05 LAB — TROPONIN I

## 2018-01-05 NOTE — ED Triage Notes (Signed)
Pt reports left upper arm burning sensation intermittently over the last week; pt says those times the pain went away after a short time; tonight pain has been there for "a couple of hours"; pt says now also having burning sensation to left temple area-has had before but not associated with arm pain; pt denies chest pain; pt denies headache and dizziness; denies difficulty walking/talking/grasping; talking in complete coherent sentences;

## 2018-01-06 ENCOUNTER — Emergency Department: Payer: Medicare Other

## 2018-01-06 ENCOUNTER — Emergency Department
Admission: EM | Admit: 2018-01-06 | Discharge: 2018-01-06 | Disposition: A | Discharge status: Home / Self Care | Payer: Medicare Other | Attending: Emergency Medicine | Admitting: Emergency Medicine

## 2018-01-06 DIAGNOSIS — R200 Anesthesia of skin: Secondary | ICD-10-CM

## 2018-01-06 NOTE — ED Provider Notes (Signed)
[facility] Emergency Department Provider Note  ____________________________________________   First MD Initiated Contact with Patient 01/06/18 0024     (approximate)  I have reviewed the triage vital signs and the nursing notes.   HISTORY  Chief Complaint Arm Pain and Facial Pain    HPI Whitney Pace is a 72 y.o. female is self presents to the emergency department with roughly 1 week of intermittent tingling in her left face and left arm.  The symptoms are brief.  Lasting seconds at a time.  No weakness.  No headache.  No chest pain shortness of breath abdominal pain nausea or vomiting.  She had an MRI of her brain performed 1 week ago to evaluate for possible aneurysms given a strong family history and it was negative.  Her symptoms are mild to moderate in severity they are intermittent.  Nothing in particular seems to make them better or worse.  Past Medical History:  Diagnosis Date  . Anxiety   . Arthritis   . Asthma    as a child  . Cancer ([place])    uterine  . Cancer ([place])    skin  . GERD (gastroesophageal reflux disease)   . Glaucoma   . Heart murmur   . Hypertension   . MVP (mitral valve prolapse)    with repair 2008 at [facility]    Patient Active Problem List   Diagnosis Date Noted  . 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 ABDOMINAL HYSTERECTOMY     with BSO  . BREAST CYST ASPIRATION Bilateral    negative  . EYE SURGERY Left    repair detatched retina x 2, removal of "bubble' AND SCAR TISSUE IN EYE  . EYE SURGERY Left    laser treatment left eye x 3  . EYE SURGERY Bilateral    cataract extraction with IOL  . MITRAL VALVE REPAIR    . TOTAL HIP ARTHROPLASTY Left 10/15/2016   Procedure: LEFT TOTAL HIP ARTHROPLASTY ANTERIOR APPROACH;  Surgeon: Paralee Cancel, MD;  Location: [facility];  Service: Orthopedics;  Laterality: Left;    Prior to Admission medications   Medication Sig Start Date End  Date Taking? Authorizing Provider  ALPRAZolam Duanne Moron) 0.25 MG tablet Take 0.0625-0.25 mg by mouth daily as needed for anxiety.    [provider]  brimonidine-timolol (COMBIGAN) 0.2-0.5 % ophthalmic solution Place 1 drop into both eyes every 12 (twelve) hours.    [provider]  CALCIUM-VITAMIN D PO Take 1 tablet by mouth daily.    [provider]  diclofenac (CATAFLAM) 50 MG tablet Take 50 mg by mouth 2 (two) times daily.    [provider]  docusate sodium (COLACE) 100 MG capsule Take 1 capsule (100 mg total) by mouth 2 (two) times daily. 10/16/16   Danae Orleans, PA-C  dorzolamide (TRUSOPT) 2 % ophthalmic solution Place 1 drop into the left eye 2 (two) times daily.    [provider]  ferrous sulfate 325 (65 FE) MG tablet Take 1 tablet (325 mg total) by mouth 3 (three) times daily after meals. 10/16/16   Danae Orleans, PA-C  gabapentin (NEURONTIN) 300 MG capsule Take 300 mg by mouth 3 (three) times daily.    [provider]  HYDROcodone-acetaminophen (NORCO) 7.5-325 MG tablet Take 1-2 tablets by mouth every 4 (four) hours as needed for moderate pain. 10/16/16   Danae Orleans, PA-C  losartan (COZAAR) 50 MG tablet Take  25 mg by mouth at bedtime.     [provider]  lovastatin (MEVACOR) 40 MG tablet Take 40 mg by mouth at bedtime.    [provider]  magnesium oxide (MAG-OX) 400 MG tablet Take 400 mg by mouth 2 (two) times daily.    [provider]  methocarbamol (ROBAXIN) 500 MG tablet Take 1 tablet (500 mg total) by mouth every 6 (six) hours as needed for muscle spasms. 10/16/16   Danae Orleans, PA-C  Multiple Vitamin (MULTIVITAMIN WITH MINERALS) TABS tablet Take 0.5 tablets by mouth 2 (two) times daily.    [provider]  polyethylene glycol (MIRALAX / GLYCOLAX) packet Take 17 g by mouth 2 (two) times daily. 10/16/16   Danae Orleans, PA-C  ranitidine (ZANTAC) 75 MG tablet Take 75 mg by mouth  daily as needed for heartburn.     [provider]  vitamin C (ASCORBIC ACID) 500 MG tablet Take 500 mg by mouth daily.    [provider]    Allergies Atropine; Promethazine; and Tramadol  Family History  Problem Relation Age of Onset  . Breast cancer Paternal Aunt        two aunts in their 34's    Social History Social History   Tobacco Use  . Smoking status: Never Smoker  . Smokeless tobacco: Never Used  Substance Use Topics  . Alcohol use: No    Comment: wine occaasionally  . Drug use: No    Review of Systems Constitutional: No fever/chills Eyes: No visual changes. ENT: No sore throat. Cardiovascular: Denies chest pain. Respiratory: Denies shortness of breath. Gastrointestinal: No abdominal pain.  No nausea, no vomiting.  No diarrhea.  No constipation. Genitourinary: Negative for dysuria. Musculoskeletal: Negative for back pain. Skin: Negative for rash. Neurological: Negative for headaches, focal weakness or numbness.   ____________________________________________   PHYSICAL EXAM:  VITAL SIGNS: ED Triage Vitals  Enc Vitals Group     BP 01/05/18 2256 (!) 147/90     Pulse Rate 01/05/18 2256 65     Resp 01/05/18 2256 17     Temp 01/05/18 2256 97.8 F (36.6 C)     Temp Source 01/05/18 2256 Oral     SpO2 01/05/18 2256 100 %     Weight 01/05/18 2254 145 lb (65.8 kg)     Height 01/05/18 2254 5\' 3"  (1.6 m)     Head Circumference --      Peak Flow --      Pain Score 01/05/18 2304 5     Pain Loc --      Pain Edu? --      Excl. in [place]? --     Constitutional: Alert and oriented x4 well-appearing nontoxic no diaphoresis speaks full clear sentences Eyes: PERRL EOMI. Head: Atraumatic. Nose: No congestion/rhinnorhea. Mouth/Throat: No trismus Neck: No stridor.   Cardiovascular: Normal rate, regular rhythm. Grossly normal heart sounds.  Good peripheral circulation. Respiratory: Normal respiratory effort.  No retractions. Lungs CTAB and moving  good air Gastrointestinal: Soft nontender Musculoskeletal: No lower extremity edema   Neurologic:  Normal speech and language. No gross focal neurologic deficits are appreciated. Skin:  Skin is warm, dry and intact. No rash noted. Psychiatric: Mood and affect are normal. Speech and behavior are normal.    ____________________________________________   DIFFERENTIAL includes but not limited to  Stroke, TIA, radiculopathy ____________________________________________   LABS (all labs ordered are listed, but only abnormal results are displayed)  Labs Reviewed  BASIC METABOLIC PANEL - Abnormal;  Notable for the following components:      Result Value   Glucose, Bld 124 (*)    All other components within normal limits  CBC  TROPONIN I    Work reviewed by me with no acute disease __________________________________________  EKG  ED ECG REPORT I, Darel Hong, the attending physician, personally viewed and interpreted this ECG.  Date: 01/06/2018 EKG Time:  Rate: 61 Rhythm: normal sinus rhythm QRS Axis: normal Intervals: normal ST/T Wave abnormalities: normal Narrative Interpretation: no evidence of acute ischemia  ____________________________________________  RADIOLOGY  Head CT reviewed by me with no acute disease ____________________________________________   PROCEDURES  Procedure(s) performed: no  Procedures  Critical Care performed: no  Observation: no ____________________________________________   INITIAL IMPRESSION / ASSESSMENT AND PLAN / ED COURSE  Pertinent labs & imaging results that were available during my care of the patient were reviewed by me and considered in my medical decision making (see chart for details).  The patient arrives with no symptoms and a normal exam.  She had a recent normal MRI MRA of the head.  CT head today performed and is likewise unremarkable.  I had a lengthy discussion with the patient and her husband at bedside  regarding the diagnostic uncertainty but that I did not believe she had a stroke today.  She is discharged home with primary care follow-up within 2 days.      ____________________________________________   FINAL CLINICAL IMPRESSION(S) / ED DIAGNOSES  Final diagnoses:  Numbness      NEW MEDICATIONS STARTED DURING THIS VISIT:  Discharge Medication List as of 01/06/2018  2:20 AM       Note:  This document was prepared using Dragon voice recognition software and may include unintentional dictation errors.     Darel Hong, MD 01/06/18 [phone]

## 2018-01-06 NOTE — ED Notes (Signed)
Patient transported to CT 

## 2018-01-06 NOTE — Discharge Instructions (Signed)
Fortunately today your blood work and your CT scan were reassuring.  Please make an appointment to follow-up with your primary care physician in 2 days for reevaluation and return to the emergency department sooner for any concerns.  It was a pleasure to take care of you today, and thank you for coming to our emergency department.  If you have any questions or concerns before leaving please ask the nurse to grab me and I'm more than happy to go through your aftercare instructions again.  If you were prescribed any opioid pain medication today such as Norco, Vicodin, Percocet, morphine, hydrocodone, or oxycodone please make sure you do not drive when you are taking this medication as it can alter your ability to drive safely.  If you have any concerns once you are home that you are not improving or are in fact getting worse before you can make it to your follow-up appointment, please do not hesitate to call 911 and come back for further evaluation.  Darel Hong, MD  Results for orders placed or performed during the hospital encounter of 02/77/41  Basic metabolic panel  Result Value Ref Range   Sodium 138 135 - 145 mmol/L   Potassium 4.0 3.5 - 5.1 mmol/L   Chloride 103 101 - 111 mmol/L   CO2 26 22 - 32 mmol/L   Glucose, Bld 124 (H) 65 - 99 mg/dL   BUN 13 6 - 20 mg/dL   Creatinine, Ser 0.83 0.44 - 1.00 mg/dL   Calcium 9.3 8.9 - 10.3 mg/dL   GFR calc non Af Amer >60 >60 mL/min   GFR calc Af Amer >60 >60 mL/min   Anion gap 9 5 - 15  CBC  Result Value Ref Range   WBC 4.2 3.6 - 11.0 K/uL   RBC 4.63 3.80 - 5.20 MIL/uL   Hemoglobin 14.2 12.0 - 16.0 g/dL   HCT 42.5 35.0 - 47.0 %   MCV 91.9 80.0 - 100.0 fL   MCH 30.7 26.0 - 34.0 pg   MCHC 33.4 32.0 - 36.0 g/dL   RDW 13.2 11.5 - 14.5 %   Platelets 202 150 - 440 K/uL  Troponin I  Result Value Ref Range   Troponin I <0.03 <0.03 ng/mL   Dg Chest 2 View  Result Date: 01/05/2018 CLINICAL DATA:  Chest pain. EXAM: CHEST  2 VIEW COMPARISON:   Chest radiograph 10/11/2016 FINDINGS: Stable cardiac and mediastinal contours. Tortuosity of the thoracic aorta. Scarring right lower lung. No pleural effusion or pneumothorax. Thoracic spine degenerative changes. IMPRESSION: No acute cardiopulmonary process. Electronically Signed   By: Lovey Newcomer M.D.   On: 01/05/2018 23:36   Ct Head Wo Contrast  Result Date: 01/06/2018 CLINICAL DATA:  Upper extremity paresthesia EXAM: CT HEAD WITHOUT CONTRAST TECHNIQUE: Contiguous axial images were obtained from the base of the skull through the vertex without intravenous contrast. COMPARISON:  11/19/14 FINDINGS: Brain: No mass lesion, intraparenchymal hemorrhage or extra-axial collection. No evidence of acute cortical infarct. Normal appearance of the brain parenchyma and extra axial spaces for age. Vascular: No hyperdense vessel or unexpected vascular calcification. Skull: Normal visualized skull base, calvarium and extracranial soft tissues. Sinuses/Orbits: No sinus fluid levels or advanced mucosal thickening. No mastoid effusion. Normal orbits. IMPRESSION: Normal head CT. Electronically Signed   By: Ulyses Jarred M.D.   On: 01/06/2018 02:13   Mr Virgel Paling OI Contrast  Result Date: 01/01/2018 CLINICAL DATA:  72 year old female with occipital headache. Family history of aneurysm. EXAM: MRA HEAD  WITHOUT CONTRAST TECHNIQUE: Angiographic images of the Circle of Willis were obtained using MRA technique without intravenous contrast. COMPARISON:  Head CT without contrast 11/19/2014. Brain MRI 07/17/2011. FINDINGS: Cerebral volume appears grossly stable since the 2012 brain MRI. No ventriculomegaly or intracranial mass effect is evident. Antegrade flow in the posterior circulation with codominant distal vertebral arteries which are tortuous but without stenosis. Both PICA origins are patent. Tortuous vertebrobasilar junction and basilar artery without stenosis. Bilateral AICA and SCA origins appear normal. Normal right PCA  origin. There is a fetal type left PCA origin. The right posterior communicating artery is diminutive or absent. Bilateral PCA branches appear normal. Antegrade flow in both ICA siphons. Mild siphon tortuosity. Normal ophthalmic and left posterior communicating artery origins. No siphon stenosis. Normal carotid termini, MCA and ACA origins. Anterior communicating artery and visible ACA branches are within normal limits, the left ACA is mildly dominant throughout. Left MCA M1 segment, left MCA bifurcation, and visible left MCA branches appear normal aside from mild tortuosity. The right MCA bifurcates early. The right M1, right MCA bifurcation, and visible right MCA branches appear normal. IMPRESSION: Normal intracranial MRA aside from generalized tortuosity of the intracranial arteries. No intracranial aneurysm identified. Electronically Signed   By: Genevie Ann M.D.   On: 01/01/2018 10:58

## 2018-02-07 ENCOUNTER — Ambulatory Visit (INDEPENDENT_AMBULATORY_CARE_PROVIDER_SITE_OTHER): Payer: Medicare Other | Admitting: Vascular Surgery

## 2018-02-07 ENCOUNTER — Encounter (INDEPENDENT_AMBULATORY_CARE_PROVIDER_SITE_OTHER): Payer: Self-pay | Admitting: Vascular Surgery

## 2018-02-07 VITALS — BP 145/77 | HR 76 | Resp 14 | Ht 63.0 in | Wt 148.0 lb

## 2018-02-07 DIAGNOSIS — R230 Cyanosis: Secondary | ICD-10-CM | POA: Diagnosis not present

## 2018-02-07 DIAGNOSIS — M79675 Pain in left toe(s): Secondary | ICD-10-CM

## 2018-02-07 DIAGNOSIS — I10 Essential (primary) hypertension: Secondary | ICD-10-CM | POA: Diagnosis not present

## 2018-02-07 NOTE — Assessment & Plan Note (Signed)
blood pressure control important in reducing the progression of atherosclerotic disease. On appropriate oral medications.  

## 2018-02-07 NOTE — Progress Notes (Signed)
Patient ID: Whitney Pace, female   DOB: July 05, 1946, 71 y.o.   MRN: [id number]  Chief Complaint  Patient presents with  . Follow-up    Left Leg swelling and redness    HPI Whitney Pace is a 72 y.o. female.  I am asked to see the patient by Dr. Loleta Chance for evaluation of left foot pain and toe discoloration.  The patient reports several weeks ago and abrupt onset of discoloration and pain in her left fourth toe.  This is gradually improved over time but has never normalized.  The pain has also improved over time but has not completely gone away.  She has a hammertoe in this foot.  She has previously had venous intervention almost a decade ago.  No arterial assessment that she knows of.  Denies fevers or chills.  No true claudication symptoms just pain in the foot and ankle.  No clear inciting event or causative factor that started the symptoms.   Past Medical History:  Diagnosis Date  . Anxiety   . Arthritis   . Asthma    as a child  . Cancer ([place])    uterine  . Cancer ([place])    skin  . GERD (gastroesophageal reflux disease)   . Glaucoma   . Heart murmur   . Hypertension   . MVP (mitral valve prolapse)    with repair 2008 at [facility]    Past Surgical History:  Procedure Laterality Date  . ABDOMINAL HYSTERECTOMY     with BSO  . BREAST CYST ASPIRATION Bilateral    negative  . EYE SURGERY Left    repair detatched retina x 2, removal of "bubble' AND SCAR TISSUE IN EYE  . EYE SURGERY Left    laser treatment left eye x 3  . EYE SURGERY Bilateral    cataract extraction with IOL  . MITRAL VALVE REPAIR    . TOTAL HIP ARTHROPLASTY Left 10/15/2016   Procedure: LEFT TOTAL HIP ARTHROPLASTY ANTERIOR APPROACH;  Surgeon: Paralee Cancel, MD;  Location: [facility];  Service: Orthopedics;  Laterality: Left;    Family History  Problem Relation Age of Onset  . Breast cancer Paternal Aunt        two aunts in their 48's  No bleeding disorders, clotting disorders, autoimmune diseases, or  aneurysms  Social History Social History   Tobacco Use  . Smoking status: Never Smoker  . Smokeless tobacco: Never Used  Substance Use Topics  . Alcohol use: No    Comment: wine occaasionally  . Drug use: No    Allergies  Allergen Reactions  . Atropine Other (See Comments)    Increased blood pressure  . Promethazine Other (See Comments)    anxiety  . Tramadol Nausea And Vomiting    Patient also states flushing and throat was hoarse    Current Outpatient Medications  Medication Sig Dispense Refill  . acetaminophen (TYLENOL) 500 MG tablet Take by mouth.    . ALPRAZolam (XANAX) 0.25 MG tablet Take 0.0625-0.25 mg by mouth daily as needed for anxiety.    Marland Kitchen aspirin EC 81 MG tablet Take by mouth.    . brimonidine-timolol (COMBIGAN) 0.2-0.5 % ophthalmic solution Place 1 drop into both eyes every 12 (twelve) hours.    Marland Kitchen CALCIUM-VITAMIN D PO Take 1 tablet by mouth daily.    . dorzolamide (TRUSOPT) 2 % ophthalmic solution Place 1 drop into the left eye 2 (two) times daily.    Marland Kitchen gabapentin (NEURONTIN) 300 MG  capsule Take 100 mg by mouth 3 (three) times daily.     . losartan (COZAAR) 50 MG tablet Take 25 mg by mouth at bedtime.     . lovastatin (MEVACOR) 40 MG tablet Take 40 mg by mouth at bedtime.    . magnesium oxide (MAG-OX) 400 MG tablet Take 400 mg by mouth 2 (two) times daily.    . meloxicam (MOBIC) 7.5 MG tablet Take by mouth.    . Multiple Vitamin (MULTIVITAMIN WITH MINERALS) TABS tablet Take 0.5 tablets by mouth 2 (two) times daily.    . vitamin C (ASCORBIC ACID) 500 MG tablet Take 500 mg by mouth daily.    . chlorhexidine (PERIDEX) 0.12 % solution   0  . COLCRYS 0.6 MG tablet   0  . diclofenac (CATAFLAM) 50 MG tablet Take 50 mg by mouth 2 (two) times daily.    . docusate sodium (COLACE) 100 MG capsule Take 1 capsule (100 mg total) by mouth 2 (two) times daily. (Patient not taking: Reported on 02/07/2018) 10 capsule 0  . ferrous sulfate 325 (65 FE) MG tablet Take 1 tablet (325 mg  total) by mouth 3 (three) times daily after meals. (Patient not taking: Reported on 02/07/2018)  3  . furosemide (LASIX) 20 MG tablet   0  . HYDROcodone-acetaminophen (NORCO) 7.5-325 MG tablet Take 1-2 tablets by mouth every 4 (four) hours as needed for moderate pain. (Patient not taking: Reported on 02/07/2018) 60 tablet 0  . methocarbamol (ROBAXIN) 500 MG tablet Take 1 tablet (500 mg total) by mouth every 6 (six) hours as needed for muscle spasms. (Patient not taking: Reported on 02/07/2018) 40 tablet 0  . polyethylene glycol (MIRALAX / GLYCOLAX) packet Take 17 g by mouth 2 (two) times daily. (Patient not taking: Reported on 02/07/2018) 14 each 0  . ranitidine (ZANTAC) 75 MG tablet Take 75 mg by mouth daily as needed for heartburn.      No current facility-administered medications for this visit.       REVIEW OF SYSTEMS (Negative unless checked)  Constitutional: []Weight loss  []Fever  []Chills Cardiac: []Chest pain   []Chest pressure   []Palpitations   []Shortness of breath when laying flat   []Shortness of breath at rest   []Shortness of breath with exertion. 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   []Non-healing ulcers Pulmonary:   []Uses home oxygen   []Productive cough   []Hemoptysis   []Wheeze  []COPD   []Asthma Neurologic:  []Dizziness  []Blackouts   []Seizures   []History of stroke   []History of TIA  []Aphasia   []Temporary blindness   []Dysphagia   []Weakness or numbness in arms   []Weakness or numbness in legs Musculoskeletal:  [x]Arthritis   []Joint swelling   [x]Joint pain   []Low back pain Hematologic:  []Easy bruising  []Easy bleeding   []Hypercoagulable state   []Anemic  []Hepatitis Gastrointestinal:  []Blood in stool   []Vomiting blood   []Gastroesophageal reflux/heartburn   []Abdominal pain Genitourinary:  []Chronic kidney disease   []Difficult urination  []Frequent urination  []Burning with urination   []Hematuria Skin:  []Rashes   []Ulcers   []Wounds Psychological:  []History   of anxiety   [] History of major depression.    Physical Exam BP (!) 145/77 (BP Location: Left Arm, Patient Position: Sitting)   Pulse 76   Resp 14   Ht 5' 3" (1.6 m)   Wt 67.1 kg (148 lb)   BMI 26.22 kg/m  Gen:  WD/WN, NAD Head: [employer]/AT, No temporalis wasting.  Ear/Nose/Throat: Hearing grossly intact, nares w/o erythema or drainage, oropharynx w/o Erythema/Exudate Eyes: Conjunctiva clear, sclera non-icteric  Neck: trachea midline.  No JVD.  Pulmonary:  Good air movement, respirations not labored, no use of accessory muscles Cardiac: RRR Vascular:  Vessel Right Left  Radial Palpable Palpable                          PT  1+ palpable  1+ palpable  DP Palpable  trace palpable    Musculoskeletal: M/S 5/5 throughout.  Left fourth toe is mildly cyanotic and does appear to have a hammertoe.  No edema. Neurologic: Sensation grossly intact in extremities.  Symmetrical.  Speech is fluent. Motor exam as listed above. Psychiatric: Judgment intact, Mood & affect appropriate for pt's clinical situation. Dermatologic: No rashes or ulcers noted.  No cellulitis or open wounds.  Radiology No results found.  Labs Recent Results (from the past 2160 hour(s))  Basic metabolic panel     Status: Abnormal   Collection Time: 01/05/18 11:00 PM  Result Value Ref Range   Sodium 138 135 - 145 mmol/L   Potassium 4.0 3.5 - 5.1 mmol/L   Chloride 103 101 - 111 mmol/L   CO2 26 22 - 32 mmol/L   Glucose, Bld 124 (H) 65 - 99 mg/dL   BUN 13 6 - 20 mg/dL   Creatinine, Ser 0.83 0.44 - 1.00 mg/dL   Calcium 9.3 8.9 - 10.3 mg/dL   GFR calc non Af Amer >60 >60 mL/min   GFR calc Af Amer >60 >60 mL/min    Comment: (NOTE) The eGFR has been calculated using the CKD  EPI equation. This calculation has not been validated in all clinical situations. eGFR's persistently <60 mL/min signify possible Chronic Kidney Disease.    Anion gap 9 5 - 15    Comment: Performed at [facility], [address] [place], [place] [address]  CBC     Status: None   Collection Time: 01/05/18 11:00 PM  Result Value Ref Range   WBC 4.2 3.6 - 11.0 K/uL   RBC 4.63 3.80 - 5.20 MIL/uL   Hemoglobin 14.2 12.0 - 16.0 g/dL   HCT 42.5 35.0 - 47.0 %   MCV 91.9 80.0 - 100.0 fL   MCH 30.7 26.0 - 34.0 pg   MCHC 33.4 32.0 - 36.0 g/dL   RDW 13.2 11.5 - 14.5 %   Platelets 202 150 - 440 K/uL    Comment: Performed at [facility], [address] [place], [place] [address]  Troponin I     Status: None   Collection Time: 01/05/18 11:00 PM  Result Value Ref Range   Troponin I <0.03 <0.03 ng/mL    Comment: Performed at [facility], [address] [place], [place] [address]    Assessment/Plan:  Hypertension blood pressure control important in reducing the progression of atherosclerotic disease. On appropriate oral medications.   Extremity cyanosis This has the appearance of blue toe syndrome.  We discussed the pathophysiology and natural history of blue toe syndrome.  I started her on Plavix today.  We will obtain noninvasive arterial studies in the near future at her convenience to determine whether or not intervention would be appropriate at this point.  Pain in limb This has the appearance of blue toe syndrome.  Other issues could certainly be causing some of the discomfort as well.  Has neuropathy as well.  Arterial studies as planned above.  Plavix added today.  Continue statin agent.        02/07/2018, 4:04 PM   This note was created with Dragon medical transcription system.  Any errors from dictation are unintentional.   

## 2018-02-07 NOTE — Patient Instructions (Signed)

## 2018-02-07 NOTE — Assessment & Plan Note (Signed)
This has the appearance of blue toe syndrome.  We discussed the pathophysiology and natural history of blue toe syndrome.  I started her on Plavix today.  We will obtain noninvasive arterial studies in the near future at her convenience to determine whether or not intervention would be appropriate at this point.

## 2018-02-07 NOTE — Assessment & Plan Note (Signed)
This has the appearance of blue toe syndrome.  Other issues could certainly be causing some of the discomfort as well.  Has neuropathy as well.  Arterial studies as planned above.  Plavix added today.  Continue statin agent.

## 2018-02-11 ENCOUNTER — Emergency Department: Payer: Medicare Other

## 2018-02-11 ENCOUNTER — Observation Stay
Admission: EM | Admit: 2018-02-11 | Discharge: 2018-02-13 | Disposition: A | Discharge status: Home / Self Care | Payer: Medicare Other | Attending: Internal Medicine | Admitting: Internal Medicine

## 2018-02-11 ENCOUNTER — Other Ambulatory Visit: Payer: Self-pay

## 2018-02-11 DIAGNOSIS — Z85828 Personal history of other malignant neoplasm of skin: Secondary | ICD-10-CM | POA: Insufficient documentation

## 2018-02-11 DIAGNOSIS — Z79899 Other long term (current) drug therapy: Secondary | ICD-10-CM | POA: Insufficient documentation

## 2018-02-11 DIAGNOSIS — R002 Palpitations: Secondary | ICD-10-CM | POA: Diagnosis not present

## 2018-02-11 DIAGNOSIS — Z791 Long term (current) use of non-steroidal anti-inflammatories (NSAID): Secondary | ICD-10-CM | POA: Diagnosis not present

## 2018-02-11 DIAGNOSIS — I10 Essential (primary) hypertension: Secondary | ICD-10-CM | POA: Insufficient documentation

## 2018-02-11 DIAGNOSIS — Z7902 Long term (current) use of antithrombotics/antiplatelets: Secondary | ICD-10-CM | POA: Diagnosis not present

## 2018-02-11 DIAGNOSIS — H409 Unspecified glaucoma: Secondary | ICD-10-CM | POA: Insufficient documentation

## 2018-02-11 DIAGNOSIS — Z8542 Personal history of malignant neoplasm of other parts of uterus: Secondary | ICD-10-CM | POA: Diagnosis not present

## 2018-02-11 DIAGNOSIS — I493 Ventricular premature depolarization: Secondary | ICD-10-CM | POA: Insufficient documentation

## 2018-02-11 DIAGNOSIS — Z7982 Long term (current) use of aspirin: Secondary | ICD-10-CM | POA: Diagnosis not present

## 2018-02-11 DIAGNOSIS — F419 Anxiety disorder, unspecified: Secondary | ICD-10-CM | POA: Diagnosis not present

## 2018-02-11 DIAGNOSIS — Z96642 Presence of left artificial hip joint: Secondary | ICD-10-CM | POA: Diagnosis not present

## 2018-02-11 DIAGNOSIS — I498 Other specified cardiac arrhythmias: Secondary | ICD-10-CM | POA: Diagnosis present

## 2018-02-11 DIAGNOSIS — I4891 Unspecified atrial fibrillation: Secondary | ICD-10-CM

## 2018-02-11 LAB — BASIC METABOLIC PANEL
Anion gap: 9 (ref 5–15)
BUN: 17 mg/dL (ref 6–20)
CALCIUM: 8.9 mg/dL (ref 8.9–10.3)
CO2: 25 mmol/L (ref 22–32)
CREATININE: 0.79 mg/dL (ref 0.44–1.00)
Chloride: 97 mmol/L — ABNORMAL LOW (ref 101–111)
GFR calc Af Amer: >60 mL/min (ref >60)
GLUCOSE: 108 mg/dL — AB (ref 65–99)
Potassium: 4.1 mmol/L (ref 3.5–5.1)
Sodium: 131 mmol/L — ABNORMAL LOW (ref 135–145)

## 2018-02-11 LAB — CBC
HCT: 41.9 % (ref 35.0–47.0)
Hemoglobin: 14.4 g/dL (ref 12.0–16.0)
MCH: 32 pg (ref 26.0–34.0)
MCHC: 34.3 g/dL (ref 32.0–36.0)
MCV: 93.4 fL (ref 80.0–100.0)
Platelets: 217 10*3/uL (ref 150–440)
RBC: 4.49 MIL/uL (ref 3.80–5.20)
RDW: 14 % (ref 11.5–14.5)
WBC: 4.2 10*3/uL (ref 3.6–11.0)

## 2018-02-11 LAB — TROPONIN I

## 2018-02-11 NOTE — ED Notes (Signed)
Pt amb to room 4 without difficulty or distress noted; placed in hosp gown & on card monitor for further monitoring

## 2018-02-11 NOTE — ED Triage Notes (Signed)
Pt c/o feeling palpitations with hugh b/p since this morning,. Pt denies any SOB or chest pain .Marland Kitchen

## 2018-02-11 NOTE — ED Provider Notes (Addendum)
[facility] Emergency Department Provider Note    First MD Initiated Contact with Patient 02/11/18 2343     (approximate)  I have reviewed the triage vital signs and the nursing notes.   HISTORY  Chief Complaint Palpitations    HPI Whitney Pace is a 72 y.o. female list of chronic medical conditions presents to the emergency department with a 1 day history of palpitations and high blood pressure.  Patient denies any chest pain no shortness of breath.  Patient denies any dizziness or weakness.  Patient does admit to bilateral lower extremity   Past Medical History:  Diagnosis Date  . Anxiety   . Arthritis   . Asthma    as a child  . Cancer ([place])    uterine  . Cancer ([place])    skin  . GERD (gastroesophageal reflux disease)   . Glaucoma   . Heart murmur   . Hypertension   . MVP (mitral valve prolapse)    with repair 2008 at [facility]    Patient Active Problem List   Diagnosis Date Noted  . Hypertension 02/07/2018  . Extremity cyanosis 02/07/2018  . Pain in limb 02/07/2018  . 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 ABDOMINAL HYSTERECTOMY     with BSO  . BREAST CYST ASPIRATION Bilateral    negative  . EYE SURGERY Left    repair detatched retina x 2, removal of "bubble' AND SCAR TISSUE IN EYE  . EYE SURGERY Left    laser treatment left eye x 3  . EYE SURGERY Bilateral    cataract extraction with IOL  . MITRAL VALVE REPAIR    . TOTAL HIP ARTHROPLASTY Left 10/15/2016   Procedure: LEFT TOTAL HIP ARTHROPLASTY ANTERIOR APPROACH;  Surgeon: Paralee Cancel, MD;  Location: [facility] ORS;  Service: Orthopedics;  Laterality: Left;    Prior to Admission medications   Medication Sig Start Date End Date Taking? Authorizing Provider  acetaminophen (TYLENOL) 500 MG tablet Take by mouth.    [provider]  ALPRAZolam Duanne Moron) 0.25 MG tablet Take 0.0625-0.25 mg by mouth daily as needed for anxiety.    [provider]  aspirin EC 81 MG tablet Take by mouth.    [provider]  brimonidine-timolol (COMBIGAN) 0.2-0.5 % ophthalmic solution Place 1 drop into both eyes every 12 (twelve) hours.    [provider]  CALCIUM-VITAMIN D PO Take 1 tablet by mouth daily.    [provider]  chlorhexidine (PERIDEX) 0.12 % solution  11/15/17   [provider]  COLCRYS 0.6 MG tablet  12/09/17   [provider]  diclofenac (CATAFLAM) 50 MG tablet Take 50 mg by mouth 2 (two) times daily.    [provider]  docusate sodium (COLACE) 100 MG capsule Take 1 capsule (100 mg total) by mouth 2 (two) times daily. Patient not taking: Reported on 02/07/2018 10/16/16   Danae Orleans, PA-C  dorzolamide (TRUSOPT) 2 % ophthalmic solution Place 1 drop into the left eye 2 (two) times daily.    [provider]  ferrous sulfate 325 (65 FE) MG tablet Take 1 tablet (325 mg total) by mouth 3 (three) times daily after meals. Patient not taking: Reported on 02/07/2018 10/16/16   Danae Orleans, PA-C  furosemide (LASIX) 20 MG tablet  12/18/17   [provider]  gabapentin (NEURONTIN) 300 MG capsule Take 100 mg by mouth 3 (three) times daily.  [provider]  HYDROcodone-acetaminophen (NORCO) 7.5-325 MG tablet Take 1-2 tablets by mouth every 4 (four) hours as needed for moderate pain. Patient not taking: Reported on 02/07/2018 10/16/16   Danae Orleans, PA-C  losartan (COZAAR) 50 MG tablet Take 25 mg by mouth at bedtime.     [provider]  lovastatin (MEVACOR) 40 MG tablet Take 40 mg by mouth at bedtime.    [provider]  magnesium oxide (MAG-OX) 400 MG tablet Take 400 mg by mouth 2 (two) times daily.    [provider]  meloxicam (MOBIC) 7.5 MG tablet Take by mouth. 01/07/18 01/07/19  [provider]  methocarbamol (ROBAXIN) 500 MG tablet Take 1 tablet (500 mg total) by mouth every 6 (six) hours as needed for muscle  spasms. Patient not taking: Reported on 02/07/2018 10/16/16   Danae Orleans, PA-C  Multiple Vitamin (MULTIVITAMIN WITH MINERALS) TABS tablet Take 0.5 tablets by mouth 2 (two) times daily.    [provider]  polyethylene glycol (MIRALAX / GLYCOLAX) packet Take 17 g by mouth 2 (two) times daily. Patient not taking: Reported on 02/07/2018 10/16/16   Danae Orleans, PA-C  ranitidine (ZANTAC) 75 MG tablet Take 75 mg by mouth daily as needed for heartburn.     [provider]  vitamin C (ASCORBIC ACID) 500 MG tablet Take 500 mg by mouth daily.    [provider]    Allergies Atropine; Promethazine; and Tramadol  Family History  Problem Relation Age of Onset  . Breast cancer Paternal Aunt        two aunts in their 54's    Social History Social History   Tobacco Use  . Smoking status: Never Smoker  . Smokeless tobacco: Never Used  Substance Use Topics  . Alcohol use: No    Comment: wine occaasionally  . Drug use: No    Review of Systems Constitutional: No fever/chills Eyes: No visual changes. ENT: No sore throat. Cardiovascular: Denies chest pain.  Positive For heart palpitations. Respiratory: Denies shortness of breath. Gastrointestinal: No abdominal pain.  No nausea, no vomiting.  No diarrhea.  No constipation. Genitourinary: Negative for dysuria. Musculoskeletal: Negative for neck pain.  Negative for back pain. Integumentary: Negative for rash. Neurological: Negative for headaches, focal weakness or numbness.   ____________________________________________   PHYSICAL EXAM:  VITAL SIGNS: ED Triage Vitals  Enc Vitals Group     BP 02/11/18 1826 (!) 154/104     Pulse Rate 02/11/18 1826 89     Resp 02/11/18 1826 17     Temp 02/11/18 1826 98.9 F (37.2 C)     Temp Source 02/11/18 1826 Oral     SpO2 02/11/18 1826 97 %     Weight 02/11/18 1835 67.1 kg (148 lb)     Height 02/11/18 1835 1.6 m (5\' 3" )     Head Circumference --      Peak Flow --       Pain Score --      Pain Loc --      Pain Edu? --      Excl. in [place]? --     Constitutional: Alert and oriented. Well appearing and in no acute distress. Eyes: Conjunctivae are normal.  Head: Atraumatic. Mouth/Throat: Mucous membranes are moist.  Oropharynx non-erythematous. Neck: No stridor.   Cardiovascular: Tachycardia, irregular rhythm. Good peripheral circulation. Grossly normal heart sounds. Respiratory: Normal respiratory effort.  No retractions. Lungs CTAB. Gastrointestinal: Soft and nontender. No distention.  Musculoskeletal: No lower extremity  tenderness nor edema. No gross deformities of extremities. Neurologic:  Normal speech and language. No gross focal neurologic deficits are appreciated.  Skin:  Skin is warm, dry and intact. No rash noted. Psychiatric: Mood and affect are normal. Speech and behavior are normal.  ____________________________________________   LABS (all labs ordered are listed, but only abnormal results are displayed)  Labs Reviewed  BASIC METABOLIC PANEL - Abnormal; Notable for the following components:      Result Value   Sodium 131 (*)    Chloride 97 (*)    Glucose, Bld 108 (*)    All other components within normal limits  CBC  TROPONIN I  TROPONIN I  FIBRIN DERIVATIVES D-DIMER ([facility] ONLY)   ____________________________________________  EKG  ED ECG REPORT I, [place] Francetta [place], the attending physician, personally viewed and interpreted this ECG.   ED ECG REPORT I, [place] N Jodine Muchmore, the attending physician, personally viewed and interpreted this ECG.     Date: 02/12/2018  EKG Time: 6:32 PM  Rate: 89  Rhythm: Sinus arrhythmia with occasional PVC  Axis: Normal  Intervals: Irregular RR interval  ST&T Change: None  ____________________________________________  RADIOLOGY I, [place] N Alana [place], personally viewed and evaluated these images (plain radiographs) as part of my medical decision making, as well as reviewing the written  report by the radiologist.  ED MD interpretation: No acute cardiopulmonary findings noted on chest x-ray.  Official radiology report(s): Dg Chest 2 View  Result Date: 02/11/2018 CLINICAL DATA:  72 year old female with palpitations since this morning. EXAM: CHEST - 2 VIEW COMPARISON:  01/05/2018 and earlier. FINDINGS: Stable mild-to-moderate cardiomegaly and mediastinal contours. Stable large lung volumes and bilateral basilar predominant curvilinear opacity which most resembles scarring. No pneumothorax, pulmonary edema, pleural effusion, or acute pulmonary opacity. Visualized tracheal air column is within normal limits. No acute osseous abnormality identified. Negative visible bowel gas pattern. IMPRESSION: 1.  No acute cardiopulmonary abnormality. 2. Stable cardiomegaly and bibasilar scarring. Electronically Signed   By: Genevie Ann M.D.   On: 02/11/2018 18:52      Procedures   ____________________________________________   INITIAL IMPRESSION / ASSESSMENT AND PLAN / ED COURSE  As part of my medical decision making, I reviewed the following data within the electronic MEDICAL RECORD NUMBER   72 year old female presenting with above-stated history and physical exam secondary to repetitions.  During the patient's evaluation noted on the monitor.  Patient had episodes of atrial fibrillation occasionally with rapid ventricular response heart rate varying from 80-134 abruptly.  In addition patient had multiple PVCs noted during evaluation. ____________________________________________  FINAL CLINICAL IMPRESSION(S) / ED DIAGNOSES  Final diagnoses:  Atrial fibrillation with RVR (HCC)  Sinus arrhythmia. Premature ventricular contraction  MEDICATIONS GIVEN DURING THIS VISIT:  Medications - No data to display   ED Discharge Orders    None       Note:  This document was prepared using Dragon voice recognition software and may include unintentional dictation errors.    Gregor Hams,  MD 02/12/18 0981    Gregor Hams, MD 02/21/18 [phone]

## 2018-02-12 ENCOUNTER — Observation Stay
Admit: 2018-02-12 | Discharge: 2018-02-12 | Disposition: A | Discharge status: Home / Self Care | Payer: Medicare Other | Attending: Internal Medicine | Admitting: Internal Medicine

## 2018-02-12 ENCOUNTER — Emergency Department: Payer: Medicare Other

## 2018-02-12 ENCOUNTER — Other Ambulatory Visit: Payer: Self-pay

## 2018-02-12 DIAGNOSIS — R002 Palpitations: Secondary | ICD-10-CM | POA: Diagnosis not present

## 2018-02-12 DIAGNOSIS — I498 Other specified cardiac arrhythmias: Secondary | ICD-10-CM | POA: Diagnosis present

## 2018-02-12 LAB — FIBRIN DERIVATIVES D-DIMER (ARMC ONLY): Fibrin derivatives D-dimer (ARMC): 493.26 ng/mL (FEU) (ref 0.00–499.00)

## 2018-02-12 LAB — TROPONIN I

## 2018-02-12 LAB — ECHOCARDIOGRAM COMPLETE
Height: 63 in
WEIGHTICAEL: 2368 [oz_av]

## 2018-02-12 LAB — TSH: TSH: 0.931 u[IU]/mL (ref 0.350–4.500)

## 2018-02-12 MED ORDER — DOCUSATE SODIUM 100 MG PO CAPS
100.0000 mg | ORAL_CAPSULE | Freq: Two times a day (BID) | ORAL | Status: DC
  Administered 2018-02-12 – 2018-02-13 (×2): 100 mg via ORAL
  Filled 2018-02-12 (×2): qty 1

## 2018-02-12 MED ORDER — CALCIUM CARBONATE 1250 (500 CA) MG PO TABS
500.0000 mg | ORAL_TABLET | Freq: Every day | ORAL | Status: DC
  Filled 2018-02-12: qty 1

## 2018-02-12 MED ORDER — GABAPENTIN 100 MG PO CAPS
100.0000 mg | ORAL_CAPSULE | Freq: Three times a day (TID) | ORAL | Status: DC | PRN
  Administered 2018-02-12: 100 mg via ORAL

## 2018-02-12 MED ORDER — LOSARTAN POTASSIUM 25 MG PO TABS: 25.0000 mg | ORAL_TABLET | Freq: Every day | ORAL | Status: DC

## 2018-02-12 MED ORDER — BRINZOLAMIDE 1 % OP SUSP
1.0000 [drp] | Freq: Two times a day (BID) | OPHTHALMIC | Status: DC
  Administered 2018-02-12 – 2018-02-13 (×3): 1 [drp] via OPHTHALMIC
  Filled 2018-02-12: qty 10

## 2018-02-12 MED ORDER — ENOXAPARIN SODIUM 40 MG/0.4ML ~~LOC~~ SOLN
40.0000 mg | SUBCUTANEOUS | Status: DC
  Administered 2018-02-12 – 2018-02-13 (×2): 40 mg via SUBCUTANEOUS
  Filled 2018-02-12 (×2): qty 0.4

## 2018-02-12 MED ORDER — MAGNESIUM OXIDE 400 (241.3 MG) MG PO TABS
400.0000 mg | ORAL_TABLET | Freq: Every day | ORAL | Status: DC
  Administered 2018-02-12 – 2018-02-13 (×2): 400 mg via ORAL
  Filled 2018-02-12 (×2): qty 1

## 2018-02-12 MED ORDER — SODIUM CHLORIDE 0.9 % IV SOLN
INTRAVENOUS | Status: AC
  Administered 2018-02-12: 13:00:00 via INTRAVENOUS

## 2018-02-12 MED ORDER — CARBOXYMETHYLCELLULOSE SODIUM 1 % OP SOLN: 1.0000 [drp] | OPHTHALMIC | Status: DC | PRN

## 2018-02-12 MED ORDER — ADULT MULTIVITAMIN W/MINERALS CH
1.0000 | ORAL_TABLET | Freq: Every day | ORAL | Status: DC
  Administered 2018-02-12 – 2018-02-13 (×2): 1 via ORAL
  Filled 2018-02-12 (×2): qty 1

## 2018-02-12 MED ORDER — GABAPENTIN 100 MG PO CAPS
100.0000 mg | ORAL_CAPSULE | Freq: Three times a day (TID) | ORAL | Status: DC
  Administered 2018-02-12 – 2018-02-13 (×2): 100 mg via ORAL
  Filled 2018-02-12 (×2): qty 1

## 2018-02-12 MED ORDER — PRAVASTATIN SODIUM 40 MG PO TABS
40.0000 mg | ORAL_TABLET | Freq: Every day | ORAL | Status: DC
  Administered 2018-02-12: 40 mg via ORAL
  Filled 2018-02-12: qty 1

## 2018-02-12 MED ORDER — ACETAMINOPHEN 650 MG RE SUPP: 650.0000 mg | Freq: Four times a day (QID) | RECTAL | Status: DC | PRN

## 2018-02-12 MED ORDER — CALCIUM CARBONATE ANTACID 500 MG PO CHEW
1.0000 | CHEWABLE_TABLET | Freq: Every day | ORAL | Status: DC
  Administered 2018-02-12: 200 mg via ORAL
  Filled 2018-02-12 (×2): qty 1

## 2018-02-12 MED ORDER — ALPRAZOLAM 0.25 MG PO TABS
0.2500 mg | ORAL_TABLET | Freq: Two times a day (BID) | ORAL | Status: DC | PRN
  Administered 2018-02-12: 0.25 mg via ORAL
  Filled 2018-02-12: qty 1

## 2018-02-12 MED ORDER — TIMOLOL MALEATE 0.5 % OP SOLN
1.0000 [drp] | Freq: Two times a day (BID) | OPHTHALMIC | Status: DC
  Administered 2018-02-12 – 2018-02-13 (×2): 1 [drp] via OPHTHALMIC
  Filled 2018-02-12: qty 5

## 2018-02-12 MED ORDER — VITAMIN C 500 MG PO TABS
500.0000 mg | ORAL_TABLET | Freq: Every day | ORAL | Status: DC
  Administered 2018-02-12 – 2018-02-13 (×2): 500 mg via ORAL
  Filled 2018-02-12 (×2): qty 1

## 2018-02-12 MED ORDER — LORAZEPAM 2 MG/ML IJ SOLN
0.5000 mg | Freq: Once | INTRAMUSCULAR | Status: AC
  Administered 2018-02-12: 0.5 mg via INTRAVENOUS

## 2018-02-12 MED ORDER — ASPIRIN EC 81 MG PO TBEC
81.0000 mg | DELAYED_RELEASE_TABLET | Freq: Every day | ORAL | Status: DC
  Administered 2018-02-12: 81 mg via ORAL
  Filled 2018-02-12: qty 1

## 2018-02-12 MED ORDER — ACETAMINOPHEN 325 MG PO TABS: 650.0000 mg | ORAL_TABLET | Freq: Four times a day (QID) | ORAL | Status: DC | PRN

## 2018-02-12 MED ORDER — CLOPIDOGREL BISULFATE 75 MG PO TABS
75.0000 mg | ORAL_TABLET | Freq: Every day | ORAL | Status: DC
  Administered 2018-02-12 – 2018-02-13 (×2): 75 mg via ORAL
  Filled 2018-02-12 (×2): qty 1

## 2018-02-12 MED ORDER — POLYVINYL ALCOHOL 1.4 % OP SOLN
1.0000 [drp] | OPHTHALMIC | Status: DC | PRN
  Filled 2018-02-12: qty 15

## 2018-02-12 MED ORDER — BRIMONIDINE TARTRATE 0.2 % OP SOLN
1.0000 [drp] | Freq: Two times a day (BID) | OPHTHALMIC | Status: DC
  Administered 2018-02-12 – 2018-02-13 (×2): 1 [drp] via OPHTHALMIC
  Filled 2018-02-12: qty 5

## 2018-02-12 MED ORDER — METOPROLOL TARTRATE 25 MG PO TABS
25.0000 mg | ORAL_TABLET | Freq: Two times a day (BID) | ORAL | Status: DC
  Administered 2018-02-12 – 2018-02-13 (×3): 25 mg via ORAL
  Filled 2018-02-12 (×3): qty 1

## 2018-02-12 MED ORDER — BRIMONIDINE TARTRATE-TIMOLOL 0.2-0.5 % OP SOLN
1.0000 [drp] | Freq: Two times a day (BID) | OPHTHALMIC | Status: DC
  Filled 2018-02-12: qty 5

## 2018-02-12 MED ORDER — LORAZEPAM 2 MG/ML IJ SOLN
0.5000 mg | Freq: Once | INTRAMUSCULAR | Status: DC
  Filled 2018-02-12: qty 1

## 2018-02-12 MED ORDER — ONDANSETRON HCL 4 MG/2ML IJ SOLN: 4.0000 mg | Freq: Four times a day (QID) | INTRAMUSCULAR | Status: DC | PRN

## 2018-02-12 MED ORDER — ONDANSETRON HCL 4 MG PO TABS: 4.0000 mg | ORAL_TABLET | Freq: Four times a day (QID) | ORAL | Status: DC | PRN

## 2018-02-12 NOTE — Progress Notes (Signed)
*  PRELIMINARY RESULTS* Echocardiogram 2D Echocardiogram has been performed.  Whitney Pace 02/12/2018, 3:28 PM

## 2018-02-12 NOTE — ED Notes (Signed)
Patient transported to 240 

## 2018-02-12 NOTE — Progress Notes (Signed)
Patient admitted this morning due to palpitations. Continue management as per admitting MD Await cardiology consultation Continue telemetry Order echo and TSH

## 2018-02-12 NOTE — ED Notes (Signed)
Pt ambulated to the bedside commode and returned to her bed without difficulty.

## 2018-02-12 NOTE — Consult Note (Signed)
[facility] Cardiology  CARDIOLOGY CONSULT NOTE  Patient ID: Whitney Pace MRN: [id number] DOB/AGE: 06/27/1946 72 y.o.  Admit date: 02/11/2018 Referring Physician Marcille Blanco Primary Physician [facility] Primary Cardiologist  Reason for Consultation questionable AF  HPI: 72 year old female referred for evaluation of questionable atrial fibrillation.  The patient has a known history of mitral valve prolapse with repair in 2008 and a history of well controlled hypertension.  Yesterday afternoon the patient noted that she had several occurrence of feeling like her heart was racing that occurred intermittently.  Her Fit-bit showed a heart rate of 140 beats per minute.  She notes no chest pain and no shortness of breath.  She also had no dizziness or syncopal episode.  Patient has not had episodes like this in the past.  Patient was seen in the [facility] emergency department yesterday and labs were notable for 2 negative troponin levels.   EKG showed sinus arrythmia with a heart rate of 89 beats per minute, and she was noted to have episodes of atrial fibrillation with RVR on telemetry.    Review of systems complete and found to be negative unless listed above     Past Medical History:  Diagnosis Date  . Anxiety   . Arthritis   . Asthma    as a child  . Cancer ([place])    uterine  . Cancer ([place])    skin  . GERD (gastroesophageal reflux disease)   . Glaucoma   . Heart murmur   . Hypertension   . MVP (mitral valve prolapse)    with repair [facility] at [facility]    Past Surgical History:  Procedure Laterality Date  . ABDOMINAL HYSTERECTOMY     with BSO  . BREAST CYST ASPIRATION Bilateral    negative  . EYE SURGERY Left    repair detatched retina x 2, removal of "bubble' AND SCAR TISSUE IN EYE  . EYE SURGERY Left    laser treatment left eye x 3  . EYE SURGERY Bilateral    cataract extraction with IOL  . MITRAL VALVE REPAIR    . TOTAL HIP ARTHROPLASTY Left 10/15/2016   Procedure: LEFT TOTAL HIP ARTHROPLASTY ANTERIOR  APPROACH;  Surgeon: Paralee Cancel, MD;  Location: [facility] ORS;  Service: Orthopedics;  Laterality: Left;    Medications Prior to Admission  Medication Sig Dispense Refill Last Dose  . acetaminophen (TYLENOL) 500 MG tablet Take 500 mg by mouth every 6 (six) hours as needed for mild pain, fever or headache.    prn at prn  . ALPRAZolam (XANAX) 0.25 MG tablet Take 0.25 mg by mouth 2 (two) times daily as needed for anxiety.    Past Week at Unknown time  . aspirin EC 81 MG tablet Take 81 mg by mouth at bedtime.    Past Week at Unknown time  . brimonidine-timolol (COMBIGAN) 0.2-0.5 % ophthalmic solution Place 1 drop into both eyes every 12 (twelve) hours.   02/11/2018 at am  . brinzolamide (AZOPT) 1 % ophthalmic suspension Place 1 drop into both eyes 2 (two) times daily.   02/11/2018 at Unknown time  . carboxymethylcellulose 1 % ophthalmic solution Apply 1 drop to eye as needed (dry eyes).   prn at prn  . clopidogrel (PLAVIX) 75 MG tablet Take 75 mg by mouth daily.  3 02/11/2018 at Unknown time  . gabapentin (NEURONTIN) 100 MG capsule Take 100 mg by mouth 3 (three) times daily as needed (pain).    Past Week at Unknown time  . losartan (  COZAAR) 50 MG tablet Take 25 mg by mouth at bedtime.    02/11/2018 at Unknown time  . lovastatin (MEVACOR) 40 MG tablet Take 40 mg by mouth at bedtime.   Past Week at Unknown time  . magnesium oxide (MAG-OX) 400 MG tablet Take 400 mg by mouth daily.    02/11/2018 at Unknown time  . meloxicam (MOBIC) 7.5 MG tablet Take 7.5 mg by mouth daily as needed for pain.    prn at prn  . Multiple Vitamin (MULTIVITAMIN WITH MINERALS) TABS tablet Take 1 tablet by mouth daily.    02/11/2018 at Unknown time  . Oyster Shell (OYSTER CALCIUM) 500 MG TABS tablet Take 500 mg of elemental calcium by mouth daily.   02/11/2018 at Unknown time  . vitamin C (ASCORBIC ACID) 500 MG tablet Take 500 mg by mouth daily.   02/11/2018 at Unknown time  . docusate sodium (COLACE) 100 MG capsule Take 1 capsule (100 mg total) by  mouth 2 (two) times daily. (Patient not taking: Reported on 02/07/2018) 10 capsule 0 Not Taking at Unknown time  . ferrous sulfate 325 (65 FE) MG tablet Take 1 tablet (325 mg total) by mouth 3 (three) times daily after meals. (Patient not taking: Reported on 02/07/2018)  3 Not Taking at Unknown time  . HYDROcodone-acetaminophen (NORCO) 7.5-325 MG tablet Take 1-2 tablets by mouth every 4 (four) hours as needed for moderate pain. (Patient not taking: Reported on 02/07/2018) 60 tablet 0 Not Taking at Unknown time  . methocarbamol (ROBAXIN) 500 MG tablet Take 1 tablet (500 mg total) by mouth every 6 (six) hours as needed for muscle spasms. (Patient not taking: Reported on 02/07/2018) 40 tablet 0 Not Taking at Unknown time  . polyethylene glycol (MIRALAX / GLYCOLAX) packet Take 17 g by mouth 2 (two) times daily. (Patient not taking: Reported on 02/07/2018) 14 each 0 Not Taking at Unknown time   Social History   Socioeconomic History  . Marital status: Married    Spouse name: Not on file  . Number of children: Not on file  . Years of education: Not on file  . Highest education level: Not on file  Occupational History  . Not on file  Social Needs  . Financial resource strain: Not on file  . Food insecurity:    Worry: Not on file    Inability: Not on file  . Transportation needs:    Medical: Not on file    Non-medical: Not on file  Tobacco Use  . Smoking status: Never Smoker  . Smokeless tobacco: Never Used  Substance and Sexual Activity  . Alcohol use: No    Comment: wine occaasionally  . Drug use: No  . Sexual activity: Not on file  Lifestyle  . Physical activity:    Days per week: Not on file    Minutes per session: Not on file  . Stress: Not on file  Relationships  . Social connections:    Talks on phone: Not on file    Gets together: Not on file    Attends religious service: Not on file    Active member of club or organization: Not on file    Attends meetings of clubs or organizations:  Not on file    Relationship status: Not on file  . Intimate partner violence:    Fear of current or ex partner: Not on file    Emotionally abused: Not on file    Physically abused: Not on file  Forced sexual activity: Not on file  Other Topics Concern  . Not on file  Social History Narrative  . Not on file    Family History  Problem Relation Age of Onset  . Breast cancer Paternal Aunt        two aunts in their 45's      Review of systems complete and found to be negative unless listed above      PHYSICAL EXAM  General: Well developed, well nourished, in no acute distress HEENT:  Normocephalic and atramatic Neck:  No JVD.  Lungs: Clear bilaterally to auscultation and percussion. Heart: HRRR . Normal S1 and S2 without gallops or murmurs.  Abdomen: Bowel sounds are positive, abdomen soft and non-tender  Msk:  Back normal, normal gait. Normal strength and tone for age. Extremities: No clubbing, cyanosis or edema.   Neuro: Alert and oriented X 3. Psych:  Good affect, responds appropriately  Labs:   Lab Results  Component Value Date   WBC 4.2 02/11/2018   HGB 14.4 02/11/2018   HCT 41.9 02/11/2018   MCV 93.4 02/11/2018   PLT 217 02/11/2018    Recent Labs  Lab 02/11/18 1837  NA 131*  K 4.1  CL 97*  CO2 25  BUN 17  CREATININE 0.79  CALCIUM 8.9  GLUCOSE 108*   Lab Results  Component Value Date   TROPONINI <0.03 02/11/2018   No results found for: CHOL No results found for: HDL No results found for: LDLCALC No results found for: TRIG No results found for: CHOLHDL No results found for: LDLDIRECT    Radiology: Dg Chest 2 View  Result Date: 02/11/2018 CLINICAL DATA:  72 year old female with palpitations since this morning. EXAM: CHEST - 2 VIEW COMPARISON:  01/05/2018 and earlier. FINDINGS: Stable mild-to-moderate cardiomegaly and mediastinal contours. Stable large lung volumes and bilateral basilar predominant curvilinear opacity which most resembles scarring.  No pneumothorax, pulmonary edema, pleural effusion, or acute pulmonary opacity. Visualized tracheal air column is within normal limits. No acute osseous abnormality identified. Negative visible bowel gas pattern. IMPRESSION: 1.  No acute cardiopulmonary abnormality. 2. Stable cardiomegaly and bibasilar scarring. Electronically Signed   By: Genevie Ann M.D.   On: 02/11/2018 18:52   US Venous Img Lower Bilateral  Result Date: 02/12/2018 CLINICAL DATA:  Bilateral lower extremity pain. EXAM: BILATERAL LOWER EXTREMITY VENOUS DOPPLER ULTRASOUND TECHNIQUE: Gray-scale sonography with graded compression, as well as color Doppler and duplex ultrasound were performed to evaluate the lower extremity deep venous systems from the level of the common femoral vein and including the common femoral, femoral, profunda femoral, popliteal and calf veins including the posterior tibial, peroneal and gastrocnemius veins when visible. The superficial great saphenous vein was also interrogated. Spectral Doppler was utilized to evaluate flow at rest and with distal augmentation maneuvers in the common femoral, femoral and popliteal veins. COMPARISON:  None. FINDINGS: RIGHT LOWER EXTREMITY Common Femoral Vein: No evidence of thrombus. Normal compressibility, respiratory phasicity and response to augmentation. Saphenofemoral Junction: No evidence of thrombus. Normal compressibility and flow on color Doppler imaging. Profunda Femoral Vein: No evidence of thrombus. Normal compressibility and flow on color Doppler imaging. Femoral Vein: No evidence of thrombus. Normal compressibility, respiratory phasicity and response to augmentation. Popliteal Vein: No evidence of thrombus. Normal compressibility, respiratory phasicity and response to augmentation. Calf Veins: No evidence of thrombus. Normal compressibility and flow on color Doppler imaging. Superficial Great Saphenous Vein: No evidence of thrombus. Normal compressibility. Venous Reflux:  None.  Other Findings:  None. LEFT LOWER EXTREMITY Common Femoral Vein: No evidence of thrombus. Normal compressibility, respiratory phasicity and response to augmentation. Saphenofemoral Junction: No evidence of thrombus. Normal compressibility and flow on color Doppler imaging. Profunda Femoral Vein: No evidence of thrombus. Normal compressibility and flow on color Doppler imaging. Femoral Vein: No evidence of thrombus. Normal compressibility, respiratory phasicity and response to augmentation. Popliteal Vein: No evidence of thrombus. Normal compressibility, respiratory phasicity and response to augmentation. Calf Veins: No evidence of thrombus. Normal compressibility and flow on color Doppler imaging. Superficial Great Saphenous Vein: No evidence of thrombus. Normal compressibility. Venous Reflux:  None. Other Findings:  None. IMPRESSION: No evidence of deep venous thrombosis of bilateral lower extremities. Electronically Signed   By: Jeb Levering M.D.   On: 02/12/2018 01:58    EKG: Sinus arrhythmia    ASSESSMENT AND PLAN:  1.  Sinus arrhythmia with question of atrial fibrillation that is not clearly documented on any EKG 2.  Acute onset of intermittent palpitations  Recommendations  1.  Agree with current treatment plan  2.  Metoprolol tartrate 25 mg twice daily  3.  2D echocardiogram  4.  Hold anticoagulation for now    Signed: Isaias Cowman MD,PhD, [facility] 02/12/2018, 7:58 AM

## 2018-02-12 NOTE — ED Notes (Signed)
Pt returned form ultrasound.

## 2018-02-12 NOTE — ED Notes (Signed)
Pt up to bathroom with assistance 

## 2018-02-12 NOTE — Care Management Obs Status (Signed)
[place] NOTIFICATION   Patient Details  Name: MELVINA PANGELINAN MRN: [id number] Date of Birth: [birth year]   Medicare Observation Status Notification Given:  Yes    Beverly Sessions, RN 02/12/2018, 2:55 PM

## 2018-02-12 NOTE — H&P (Signed)
Whitney Pace is an 72 y.o. female.   Chief Complaint: Palpitations HPI: The patient with past medical history of mitral valve repair 10 years ago, hypertension and remote history of uterine cancer presents to the emergency department complaining of palpitations.  The patient happened to be wearing a fitness tracker at the time that indicated her pulse was in the 160s.  EKG in the emergency department showed sinus rhythm with pulse in the 80s but telemetry would occasionally capture episodes of atrial fibrillation with rapid ventricular rate.  The patient denies chest pain or shortness of breath but admits to an anxious feeling when the palpitations occur.  She has not had episodes like this before.  Due to the new onset of arrhythmia the emergency department staff, hospitalist service for admission.  Past Medical History:  Diagnosis Date  . Anxiety   . Arthritis   . Asthma    as a child  . Cancer ([place])    uterine  . Cancer ([place])    skin  . GERD (gastroesophageal reflux disease)   . Glaucoma   . Heart murmur   . Hypertension   . MVP (mitral valve prolapse)    with repair 2008 at [facility]    Past Surgical History:  Procedure Laterality Date  . ABDOMINAL HYSTERECTOMY     with BSO  . BREAST CYST ASPIRATION Bilateral    negative  . EYE SURGERY Left    repair detatched retina x 2, removal of "bubble' AND SCAR TISSUE IN EYE  . EYE SURGERY Left    laser treatment left eye x 3  . EYE SURGERY Bilateral    cataract extraction with IOL  . MITRAL VALVE REPAIR    . TOTAL HIP ARTHROPLASTY Left 10/15/2016   Procedure: LEFT TOTAL HIP ARTHROPLASTY ANTERIOR APPROACH;  Surgeon: Paralee Cancel, MD;  Location: [facility];  Service: Orthopedics;  Laterality: Left;    Family History  Problem Relation Age of Onset  . Breast cancer Paternal Aunt        two aunts in their 42's   Social History:  reports that she has never smoked. She has never used smokeless tobacco. She reports that she does not drink  alcohol or use drugs.  Allergies:  Allergies  Allergen Reactions  . Atropine Other (See Comments)    Increased blood pressure  . Promethazine Other (See Comments)    anxiety  . Tramadol Nausea And Vomiting    Patient also states flushing and throat was hoarse    Medications Prior to Admission  Medication Sig Dispense Refill  . acetaminophen (TYLENOL) 500 MG tablet Take 500 mg by mouth every 6 (six) hours as needed for mild pain, fever or headache.     . ALPRAZolam (XANAX) 0.25 MG tablet Take 0.25 mg by mouth 2 (two) times daily as needed for anxiety.     Marland Kitchen aspirin EC 81 MG tablet Take 81 mg by mouth at bedtime.     . brimonidine-timolol (COMBIGAN) 0.2-0.5 % ophthalmic solution Place 1 drop into both eyes every 12 (twelve) hours.    . brinzolamide (AZOPT) 1 % ophthalmic suspension Place 1 drop into both eyes 2 (two) times daily.    . carboxymethylcellulose 1 % ophthalmic solution Apply 1 drop to eye as needed (dry eyes).    . clopidogrel (PLAVIX) 75 MG tablet Take 75 mg by mouth daily.  3  . gabapentin (NEURONTIN) 100 MG capsule Take 100 mg by mouth 3 (three) times daily as needed (pain).     Marland Kitchen  losartan (COZAAR) 50 MG tablet Take 25 mg by mouth at bedtime.     . lovastatin (MEVACOR) 40 MG tablet Take 40 mg by mouth at bedtime.    . magnesium oxide (MAG-OX) 400 MG tablet Take 400 mg by mouth daily.     . meloxicam (MOBIC) 7.5 MG tablet Take 7.5 mg by mouth daily as needed for pain.     . Multiple Vitamin (MULTIVITAMIN WITH MINERALS) TABS tablet Take 1 tablet by mouth daily.     [place] (OYSTER CALCIUM) 500 MG TABS tablet Take 500 mg of elemental calcium by mouth daily.    . vitamin C (ASCORBIC ACID) 500 MG tablet Take 500 mg by mouth daily.    Marland Kitchen docusate sodium (COLACE) 100 MG capsule Take 1 capsule (100 mg total) by mouth 2 (two) times daily. (Patient not taking: Reported on 02/07/2018) 10 capsule 0  . ferrous sulfate 325 (65 FE) MG tablet Take 1 tablet (325 mg total) by mouth 3  (three) times daily after meals. (Patient not taking: Reported on 02/07/2018)  3  . HYDROcodone-acetaminophen (NORCO) 7.5-325 MG tablet Take 1-2 tablets by mouth every 4 (four) hours as needed for moderate pain. (Patient not taking: Reported on 02/07/2018) 60 tablet 0  . methocarbamol (ROBAXIN) 500 MG tablet Take 1 tablet (500 mg total) by mouth every 6 (six) hours as needed for muscle spasms. (Patient not taking: Reported on 02/07/2018) 40 tablet 0  . polyethylene glycol (MIRALAX / GLYCOLAX) packet Take 17 g by mouth 2 (two) times daily. (Patient not taking: Reported on 02/07/2018) 14 each 0    Results for orders placed or performed during the hospital encounter of 02/11/18 (from the past 48 hour(s))  Basic metabolic panel     Status: Abnormal   Collection Time: 02/11/18  6:37 PM  Result Value Ref Range   Sodium 131 (L) 135 - 145 mmol/L   Potassium 4.1 3.5 - 5.1 mmol/L   Chloride 97 (L) 101 - 111 mmol/L   CO2 25 22 - 32 mmol/L   Glucose, Bld 108 (H) 65 - 99 mg/dL   BUN 17 6 - 20 mg/dL   Creatinine, Ser 0.79 0.44 - 1.00 mg/dL   Calcium 8.9 8.9 - 10.3 mg/dL   GFR calc non Af Amer >60 >60 mL/min   GFR calc Af Amer >60 >60 mL/min    Comment: (NOTE) The eGFR has been calculated using the CKD EPI equation. This calculation has not been validated in all clinical situations. eGFR's persistently <60 mL/min signify possible Chronic Kidney Disease.    Anion gap 9 5 - 15    Comment: Performed at [facility], [address] [place], [place] [address]  CBC     Status: None   Collection Time: 02/11/18  6:37 PM  Result Value Ref Range   WBC 4.2 3.6 - 11.0 K/uL   RBC 4.49 3.80 - 5.20 MIL/uL   Hemoglobin 14.4 12.0 - 16.0 g/dL   HCT 41.9 35.0 - 47.0 %   MCV 93.4 80.0 - 100.0 fL   MCH 32.0 26.0 - 34.0 pg   MCHC 34.3 32.0 - 36.0 g/dL   RDW 14.0 11.5 - 14.5 %   Platelets 217 150 - 440 K/uL    Comment: Performed at [facility], [address] [place], [place] [address]  Troponin I      Status: None   Collection Time: 02/11/18  6:37 PM  Result Value Ref Range   Troponin I <  0.03 <0.03 ng/mL    Comment: Performed at [facility], [address] [place], [place] [address]  Troponin I     Status: None   Collection Time: 02/11/18 11:39 PM  Result Value Ref Range   Troponin I <0.03 <0.03 ng/mL    Comment: Performed at [facility], [address] [place], [place] [address]  TSH     Status: None   Collection Time: 02/11/18 11:39 PM  Result Value Ref Range   TSH 0.931 0.350 - 4.500 uIU/mL    Comment: Performed by a 3rd Generation assay with a functional sensitivity of <=0.01 uIU/mL. Performed at [facility], [address] [place], [place] [address]   Fibrin derivatives D-Dimer [facility] only)     Status: None   Collection Time: 02/11/18 11:49 PM  Result Value Ref Range   Fibrin derivatives D-dimer ([facility]) 493.26 0.00 - 499.00 ng/mL (FEU)    Comment: (NOTE) <> Exclusion of Venous Thromboembolism (VTE) - OUTPATIENT ONLY   (Emergency Department or [place])   0-499 ng/ml (FEU): With a low to intermediate pretest probability                      for VTE this test result excludes the diagnosis                      of VTE.   >499 ng/ml (FEU) : VTE not excluded; additional work up for VTE is                      required. <> Testing on Inpatients and Evaluation of Disseminated Intravascular   Coagulation (DIC) Reference Range:   0-499 ng/ml (FEU) Performed at [facility], [address] [place], [place] [address]    Dg Chest 2 View  Result Date: 02/11/2018 CLINICAL DATA:  71 year old female with palpitations since this morning. EXAM: CHEST - 2 VIEW COMPARISON:  01/05/2018 and earlier. FINDINGS: Stable mild-to-moderate cardiomegaly and mediastinal contours. Stable large lung volumes and bilateral basilar predominant curvilinear opacity which most resembles scarring. No pneumothorax, pulmonary edema, pleural effusion, or acute pulmonary  opacity. Visualized tracheal air column is within normal limits. No acute osseous abnormality identified. Negative visible bowel gas pattern. IMPRESSION: 1.  No acute cardiopulmonary abnormality. 2. Stable cardiomegaly and bibasilar scarring. Electronically Signed   By: Genevie Ann M.D.   On: 02/11/2018 18:52   US Venous Img Lower Bilateral  Result Date: 02/12/2018 CLINICAL DATA:  Bilateral lower extremity pain. EXAM: BILATERAL LOWER EXTREMITY VENOUS DOPPLER ULTRASOUND TECHNIQUE: Gray-scale sonography with graded compression, as well as color Doppler and duplex ultrasound were performed to evaluate the lower extremity deep venous systems from the level of the common femoral vein and including the common femoral, femoral, profunda femoral, popliteal and calf veins including the posterior tibial, peroneal and gastrocnemius veins when visible. The superficial great saphenous vein was also interrogated. Spectral Doppler was utilized to evaluate flow at rest and with distal augmentation maneuvers in the common femoral, femoral and popliteal veins. COMPARISON:  None. FINDINGS: RIGHT LOWER EXTREMITY Common Femoral Vein: No evidence of thrombus. Normal compressibility, respiratory phasicity and response to augmentation. Saphenofemoral Junction: No evidence of thrombus. Normal compressibility and flow on color Doppler imaging. Profunda Femoral Vein: No evidence of thrombus. Normal compressibility and flow on color Doppler imaging. Femoral Vein: No evidence of thrombus. Normal compressibility, respiratory phasicity and response to augmentation. Popliteal Vein: No evidence of thrombus. Normal compressibility,  respiratory phasicity and response to augmentation. Calf Veins: No evidence of thrombus. Normal compressibility and flow on color Doppler imaging. Superficial Great Saphenous Vein: No evidence of thrombus. Normal compressibility. Venous Reflux:  None. Other Findings:  None. LEFT LOWER EXTREMITY Common Femoral Vein: No  evidence of thrombus. Normal compressibility, respiratory phasicity and response to augmentation. Saphenofemoral Junction: No evidence of thrombus. Normal compressibility and flow on color Doppler imaging. Profunda Femoral Vein: No evidence of thrombus. Normal compressibility and flow on color Doppler imaging. Femoral Vein: No evidence of thrombus. Normal compressibility, respiratory phasicity and response to augmentation. Popliteal Vein: No evidence of thrombus. Normal compressibility, respiratory phasicity and response to augmentation. Calf Veins: No evidence of thrombus. Normal compressibility and flow on color Doppler imaging. Superficial Great Saphenous Vein: No evidence of thrombus. Normal compressibility. Venous Reflux:  None. Other Findings:  None. IMPRESSION: No evidence of deep venous thrombosis of bilateral lower extremities. Electronically Signed   By: Jeb Levering M.D.   On: 02/12/2018 01:58    Review of Systems  Constitutional: Negative for chills and fever.  HENT: Negative for sore throat and tinnitus.   Eyes: Negative for blurred vision and redness.  Respiratory: Negative for cough and shortness of breath.   Cardiovascular: Positive for palpitations. Negative for chest pain, orthopnea and PND.  Gastrointestinal: Negative for abdominal pain, diarrhea, nausea and vomiting.  Genitourinary: Negative for dysuria, frequency and urgency.  Musculoskeletal: Negative for joint pain and myalgias.  Skin: Negative for rash.       No lesions  Neurological: Negative for speech change, focal weakness and weakness.  Endo/Heme/Allergies: Does not bruise/bleed easily.       No temperature intolerance  Psychiatric/Behavioral: Negative for depression and suicidal ideas.    Blood pressure (!) 161/88, pulse 84, temperature 98.3 F (36.8 C), temperature source Oral, resp. rate 17, height '5\' 3"'$  (1.6 m), weight 67.1 kg (148 lb), SpO2 100 %. Physical Exam  Vitals reviewed. Constitutional: She is  oriented to person, place, and time. She appears well-developed and well-nourished. No distress.  HENT:  Head: Normocephalic and atraumatic.  Mouth/Throat: Oropharynx is clear and moist.  Eyes: Pupils are equal, round, and reactive to light. Conjunctivae and EOM are normal. No scleral icterus.  Neck: Normal range of motion. Neck supple. No JVD present. No tracheal deviation present. No thyromegaly present.  Cardiovascular: Normal rate, regular rhythm and normal heart sounds. Exam reveals no gallop and no friction rub.  No murmur heard. Respiratory: Effort normal and breath sounds normal.  GI: Soft. Bowel sounds are normal. She exhibits no distension. There is no tenderness.  Genitourinary:  Genitourinary Comments: Deferred  Musculoskeletal: Normal range of motion. She exhibits no edema.  Lymphadenopathy:    She has no cervical adenopathy.  Neurological: She is alert and oriented to person, place, and time. No cranial nerve deficit. She exhibits normal muscle tone.  Skin: Skin is warm and dry. No rash noted. No erythema.  Psychiatric: She has a normal mood and affect. Her behavior is normal. Judgment and thought content normal.     Assessment/Plan This is a 72 year old female admitted for sinus arrhythmia. 1.  Sinus arrhythmia: Intermittent tachycardia with PVCs.  The patient also has sporadic episodes of atrial fibrillation sometimes include rapid ventricular heart rate.  No signs or symptoms of myocardial ischemia.  The patient has a history of mitral valve repair.  Chads vasc 2 score of 3 indicates need for anticoagulation.  The patient is currently on aspirin and Plavix.  Cardiology to  consult. 2.  Palpitations: Nonpainful; consider low-dose beta-blocker.  Await cardiology input. 3.  Hypertension: Uncontrolled; continue losartan/4.  DVT prophylaxis: Lovenox 5.  GI prophylaxis: None The patient is a full code.  Time spent on admission orders and patient care approximately 45  minutes  Harrie Foreman, MD 02/12/2018, 7:33 AM

## 2018-02-13 DIAGNOSIS — R002 Palpitations: Secondary | ICD-10-CM | POA: Diagnosis not present

## 2018-02-13 LAB — BASIC METABOLIC PANEL
ANION GAP: 3 — AB (ref 5–15)
BUN: 11 mg/dL (ref 6–20)
CALCIUM: 8.7 mg/dL — AB (ref 8.9–10.3)
CO2: 26 mmol/L (ref 22–32)
Chloride: 109 mmol/L (ref 101–111)
Creatinine, Ser: 0.71 mg/dL (ref 0.44–1.00)
Glucose, Bld: 95 mg/dL (ref 65–99)
Potassium: 4 mmol/L (ref 3.5–5.1)
SODIUM: 138 mmol/L (ref 135–145)

## 2018-02-13 MED ORDER — METOPROLOL TARTRATE 25 MG PO TABS: 25.0000 mg | ORAL_TABLET | Freq: Two times a day (BID) | ORAL | 0 refills | Status: DC

## 2018-02-13 NOTE — Discharge Summary (Signed)
[facility] [place] NAME: Whitney Pace    MR#:  [id number]  DATE OF BIRTH:  1946/06/20  DATE OF ADMISSION:  02/11/2018 ADMITTING PHYSICIAN: Harrie Foreman, MD  DATE OF DISCHARGE: 02/13/2018  PRIMARY CARE PHYSICIAN: Rusty Aus, MD    ADMISSION DIAGNOSIS:  Atrial fibrillation with RVR ([place]) [I48.91]  DISCHARGE DIAGNOSIS:  Active Problems:   Sinus arrhythmia   SECONDARY DIAGNOSIS:   Past Medical History:  Diagnosis Date  . Anxiety   . Arthritis   . Asthma    as a child  . Cancer ([place])    uterine  . Cancer ([place])    skin  . GERD (gastroesophageal reflux disease)   . Glaucoma   . Heart murmur   . Hypertension   . MVP (mitral valve prolapse)    with repair 2008 at [place]:  72 year old female with a history of essential hypertension who presented due to palpitations.  1. Palpitations: There is a question patient had atrial fibrillation. It does not appear the patient had any documentation atrial fibrillation. Telemetry monitor shows normal sinus rhythm. She was about by cardiology with recommendations to start metoprolol 25 mg by mouth twice a day. She does occasionally have some flushing of her face which she correlates with elevated systolicblood pressure.the highest blood pressure systolic we have recorded as 144. Some of her flushing may be due to panic attack versus endocrine disorder. If symptoms persist I asked her to  To see her primary care physician for possible evaluation  With endocrinology.   2. Essential hypertension: Patient will start metoprolol as per recommendation by cardiology.  3. Anxiety: Patient will continue on Xanax when necessary    DISCHARGE CONDITIONS AND DIET:   Stable for discharge on heart healthy diet  CONSULTS OBTAINED:  Treatment Team:  Isaias Cowman, MD  DRUG ALLERGIES:   Allergies  Allergen Reactions  . Atropine Other (See Comments)    Increased blood  pressure  . Promethazine Other (See Comments)    anxiety  . Tramadol Nausea And Vomiting    Patient also states flushing and throat was hoarse    DISCHARGE MEDICATIONS:   Allergies as of 02/13/2018      Reactions   Atropine Other (See Comments)   Increased blood pressure   Promethazine Other (See Comments)   anxiety   Tramadol Nausea And Vomiting   Patient also states flushing and throat was hoarse      Medication List    STOP taking these medications   docusate sodium 100 MG capsule Commonly known as:  COLACE   ferrous sulfate 325 (65 FE) MG tablet   HYDROcodone-acetaminophen 7.5-325 MG tablet Commonly known as:  NORCO   losartan 50 MG tablet Commonly known as:  COZAAR   methocarbamol 500 MG tablet Commonly known as:  ROBAXIN   polyethylene glycol packet Commonly known as:  MIRALAX / GLYCOLAX     TAKE these medications   acetaminophen 500 MG tablet Commonly known as:  TYLENOL Take 500 mg by mouth every 6 (six) hours as needed for mild pain, fever or headache.   ALPRAZolam 0.25 MG tablet Commonly known as:  XANAX Take 0.25 mg by mouth 2 (two) times daily as needed for anxiety.   aspirin EC 81 MG tablet Take 81 mg by mouth at bedtime.   brinzolamide 1 % ophthalmic suspension Commonly known as:  AZOPT Place 1 drop into both eyes 2 (two) times  daily.   carboxymethylcellulose 1 % ophthalmic solution Apply 1 drop to eye as needed (dry eyes).   clopidogrel 75 MG tablet Commonly known as:  PLAVIX Take 75 mg by mouth daily.   COMBIGAN 0.2-0.5 % ophthalmic solution Generic drug:  brimonidine-timolol Place 1 drop into both eyes every 12 (twelve) hours.   gabapentin 100 MG capsule Commonly known as:  NEURONTIN Take 100 mg by mouth 3 (three) times daily as needed (pain).   lovastatin 40 MG tablet Commonly known as:  MEVACOR Take 40 mg by mouth at bedtime.   magnesium oxide 400 MG tablet Commonly known as:  MAG-OX Take 400 mg by mouth daily.    meloxicam 7.5 MG tablet Commonly known as:  MOBIC Take 7.5 mg by mouth daily as needed for pain.   metoprolol tartrate 25 MG tablet Commonly known as:  LOPRESSOR Take 1 tablet (25 mg total) by mouth 2 (two) times daily.   multivitamin with minerals Tabs tablet Take 1 tablet by mouth daily.   oyster calcium 500 MG Tabs tablet Take 500 mg of elemental calcium by mouth daily.   vitamin C 500 MG tablet Commonly known as:  ASCORBIC ACID Take 500 mg by mouth daily.         Today   CHIEF COMPLAINT:   Patient had a flushing episode and her blood pressure was 144/94. Her heart rate was60 at the time. She denies chest pain.   VITAL SIGNS:  Blood pressure (!) 144/94, pulse 60, temperature 98.2 F (36.8 C), temperature source Oral, resp. rate 14, height 5\' 3"  (1.6 m), weight 64.2 kg (141 lb 9.6 oz), SpO2 100 %.   REVIEW OF SYSTEMS:  Review of Systems  Constitutional: Negative.  Negative for chills, fever and malaise/fatigue.  HENT: Negative.  Negative for ear discharge, ear pain, hearing loss, nosebleeds and sore throat.   Eyes: Negative.  Negative for blurred vision and pain.  Respiratory: Negative.  Negative for cough, hemoptysis, shortness of breath and wheezing.   Cardiovascular: Negative.  Negative for chest pain, palpitations and leg swelling.  Gastrointestinal: Negative.  Negative for abdominal pain, blood in stool, diarrhea, nausea and vomiting.  Genitourinary: Negative.  Negative for dysuria.  Musculoskeletal: Negative.  Negative for back pain.  Skin: Negative.   Neurological: Negative for dizziness, tremors, speech change, focal weakness, seizures and headaches.  Endo/Heme/Allergies: Negative.  Does not bruise/bleed easily.  Psychiatric/Behavioral: Negative.  Negative for depression, hallucinations and suicidal ideas.     PHYSICAL EXAMINATION:  GENERAL:  72 y.o.-year-old patient lying in the bed with no acute distress.  NECK:  Supple, no jugular venous  distention. No thyroid enlargement, no tenderness.  LUNGS: Normal breath sounds bilaterally, no wheezing, rales,rhonchi  No use of accessory muscles of respiration.  CARDIOVASCULAR: S1, S2 normal. No murmurs, rubs, or gallops.  ABDOMEN: Soft, non-tender, non-distended. Bowel sounds present. No organomegaly or mass.  EXTREMITIES: No pedal edema, cyanosis, or clubbing.  PSYCHIATRIC: The patient is alert and oriented x 3.  SKIN: No obvious rash, lesion, or ulcer.   DATA REVIEW:   CBC Recent Labs  Lab 02/11/18 1837  WBC 4.2  HGB 14.4  HCT 41.9  PLT 217    Chemistries  Recent Labs  Lab 02/13/18 0504  NA 138  K 4.0  CL 109  CO2 26  GLUCOSE 95  BUN 11  CREATININE 0.71  CALCIUM 8.7*    Cardiac Enzymes Recent Labs  Lab 02/11/18 1837 02/11/18 2339  TROPONINI <0.03 <0.03  Microbiology Results  @MICRORSLT48 @  RADIOLOGY:  Dg Chest 2 View  Result Date: 02/11/2018 CLINICAL DATA:  72 year old female with palpitations since this morning. EXAM: CHEST - 2 VIEW COMPARISON:  01/05/2018 and earlier. FINDINGS: Stable mild-to-moderate cardiomegaly and mediastinal contours. Stable large lung volumes and bilateral basilar predominant curvilinear opacity which most resembles scarring. No pneumothorax, pulmonary edema, pleural effusion, or acute pulmonary opacity. Visualized tracheal air column is within normal limits. No acute osseous abnormality identified. Negative visible bowel gas pattern. IMPRESSION: 1.  No acute cardiopulmonary abnormality. 2. Stable cardiomegaly and bibasilar scarring. Electronically Signed   By: Genevie Ann M.D.   On: 02/11/2018 18:52   US Venous Img Lower Bilateral  Result Date: 02/12/2018 CLINICAL DATA:  Bilateral lower extremity pain. EXAM: BILATERAL LOWER EXTREMITY VENOUS DOPPLER ULTRASOUND TECHNIQUE: Gray-scale sonography with graded compression, as well as color Doppler and duplex ultrasound were performed to evaluate the lower extremity deep venous systems from  the level of the common femoral vein and including the common femoral, femoral, profunda femoral, popliteal and calf veins including the posterior tibial, peroneal and gastrocnemius veins when visible. The superficial great saphenous vein was also interrogated. Spectral Doppler was utilized to evaluate flow at rest and with distal augmentation maneuvers in the common femoral, femoral and popliteal veins. COMPARISON:  None. FINDINGS: RIGHT LOWER EXTREMITY Common Femoral Vein: No evidence of thrombus. Normal compressibility, respiratory phasicity and response to augmentation. Saphenofemoral Junction: No evidence of thrombus. Normal compressibility and flow on color Doppler imaging. Profunda Femoral Vein: No evidence of thrombus. Normal compressibility and flow on color Doppler imaging. Femoral Vein: No evidence of thrombus. Normal compressibility, respiratory phasicity and response to augmentation. Popliteal Vein: No evidence of thrombus. Normal compressibility, respiratory phasicity and response to augmentation. Calf Veins: No evidence of thrombus. Normal compressibility and flow on color Doppler imaging. Superficial Great Saphenous Vein: No evidence of thrombus. Normal compressibility. Venous Reflux:  None. Other Findings:  None. LEFT LOWER EXTREMITY Common Femoral Vein: No evidence of thrombus. Normal compressibility, respiratory phasicity and response to augmentation. Saphenofemoral Junction: No evidence of thrombus. Normal compressibility and flow on color Doppler imaging. Profunda Femoral Vein: No evidence of thrombus. Normal compressibility and flow on color Doppler imaging. Femoral Vein: No evidence of thrombus. Normal compressibility, respiratory phasicity and response to augmentation. Popliteal Vein: No evidence of thrombus. Normal compressibility, respiratory phasicity and response to augmentation. Calf Veins: No evidence of thrombus. Normal compressibility and flow on color Doppler imaging. Superficial  Great Saphenous Vein: No evidence of thrombus. Normal compressibility. Venous Reflux:  None. Other Findings:  None. IMPRESSION: No evidence of deep venous thrombosis of bilateral lower extremities. Electronically Signed   By: Jeb Levering M.D.   On: 02/12/2018 01:58      Allergies as of 02/13/2018      Reactions   Atropine Other (See Comments)   Increased blood pressure   Promethazine Other (See Comments)   anxiety   Tramadol Nausea And Vomiting   Patient also states flushing and throat was hoarse      Medication List    STOP taking these medications   docusate sodium 100 MG capsule Commonly known as:  COLACE   ferrous sulfate 325 (65 FE) MG tablet   HYDROcodone-acetaminophen 7.5-325 MG tablet Commonly known as:  NORCO   losartan 50 MG tablet Commonly known as:  COZAAR   methocarbamol 500 MG tablet Commonly known as:  ROBAXIN   polyethylene glycol packet Commonly known as:  MIRALAX / GLYCOLAX  TAKE these medications   acetaminophen 500 MG tablet Commonly known as:  TYLENOL Take 500 mg by mouth every 6 (six) hours as needed for mild pain, fever or headache.   ALPRAZolam 0.25 MG tablet Commonly known as:  XANAX Take 0.25 mg by mouth 2 (two) times daily as needed for anxiety.   aspirin EC 81 MG tablet Take 81 mg by mouth at bedtime.   brinzolamide 1 % ophthalmic suspension Commonly known as:  AZOPT Place 1 drop into both eyes 2 (two) times daily.   carboxymethylcellulose 1 % ophthalmic solution Apply 1 drop to eye as needed (dry eyes).   clopidogrel 75 MG tablet Commonly known as:  PLAVIX Take 75 mg by mouth daily.   COMBIGAN 0.2-0.5 % ophthalmic solution Generic drug:  brimonidine-timolol Place 1 drop into both eyes every 12 (twelve) hours.   gabapentin 100 MG capsule Commonly known as:  NEURONTIN Take 100 mg by mouth 3 (three) times daily as needed (pain).   lovastatin 40 MG tablet Commonly known as:  MEVACOR Take 40 mg by mouth at bedtime.    magnesium oxide 400 MG tablet Commonly known as:  MAG-OX Take 400 mg by mouth daily.   meloxicam 7.5 MG tablet Commonly known as:  MOBIC Take 7.5 mg by mouth daily as needed for pain.   metoprolol tartrate 25 MG tablet Commonly known as:  LOPRESSOR Take 1 tablet (25 mg total) by mouth 2 (two) times daily.   multivitamin with minerals Tabs tablet Take 1 tablet by mouth daily.   oyster calcium 500 MG Tabs tablet Take 500 mg of elemental calcium by mouth daily.   vitamin C 500 MG tablet Commonly known as:  ASCORBIC ACID Take 500 mg by mouth daily.         Management plans discussed with the patient and she is in agreement. Stable for discharge home  Patient should follow up with cardiology  CODE STATUS:     Code Status Orders  (From admission, onward)        Start     Ordered   02/12/18 0548  Full code  Continuous     02/12/18 0547    Code Status History    Date Active Date Inactive Code Status Order ID Comments User Context   10/15/2016 1618 10/16/2016 1910 Full Code [id number]  Danae Orleans, PA-C Inpatient    Advance Directive Documentation     Most Recent Value  Type of Advance Directive  Living will, Healthcare Power of Attorney  Pre-existing out of facility DNR order (yellow form or pink MOST form)  -  "MOST" Form in Place?  -      TOTAL TIME TAKING CARE OF THIS PATIENT: 38 minutes.    Note: This dictation was prepared with Dragon dictation along with smaller phrase technology. Any transcriptional errors that result from this process are unintentional.  Thaddeus Evitts M.D on 02/13/2018 at 11:07 AM  Between 7am to 6pm - Pager - [phone] After 6pm go to [email or web address] - password [email or web address] [facility]  Office  [phone]  CC: Primary care physician; Rusty Aus, MD

## 2018-02-13 NOTE — Progress Notes (Signed)
Discharge instructions explained to pt and pts spouse/ verbalized an understanding/ iv and tele removed/ will transport off unit via wheelchair.  

## 2018-03-12 ENCOUNTER — Ambulatory Visit (INDEPENDENT_AMBULATORY_CARE_PROVIDER_SITE_OTHER): Payer: Medicare Other

## 2018-03-12 ENCOUNTER — Ambulatory Visit (INDEPENDENT_AMBULATORY_CARE_PROVIDER_SITE_OTHER): Payer: Medicare Other | Admitting: Vascular Surgery

## 2018-03-12 ENCOUNTER — Encounter (INDEPENDENT_AMBULATORY_CARE_PROVIDER_SITE_OTHER): Payer: Self-pay | Admitting: Vascular Surgery

## 2018-03-12 VITALS — BP 119/75 | HR 52 | Resp 14 | Ht 62.0 in | Wt 147.0 lb

## 2018-03-12 DIAGNOSIS — R230 Cyanosis: Secondary | ICD-10-CM | POA: Diagnosis not present

## 2018-03-12 DIAGNOSIS — I8393 Asymptomatic varicose veins of bilateral lower extremities: Secondary | ICD-10-CM | POA: Insufficient documentation

## 2018-03-12 DIAGNOSIS — E785 Hyperlipidemia, unspecified: Secondary | ICD-10-CM | POA: Diagnosis not present

## 2018-03-12 NOTE — Progress Notes (Signed)
Subjective:    Patient ID: Whitney Pace, female    DOB: 1946-02-23, 72 y.o.   MRN: [id number] Chief Complaint  Patient presents with  . Follow-up    Ultrasound follow up   Patient presents to review vascular studies.  The patient was last seen on February 07, 2018 for evaluation of lower extremity toe cyanosis.  The patient notes that since starting the Plavix she has not experienced any cyanosis or erythema to her toes.  The patient states that her symptoms have improved.  The patient underwent a bilateral ABI which was notable for triphasic tibials and normal great big toe brachial indices bilaterally.  A left lower extremity arterial duplex was notable for triphasic blood flow distally.  The patient denies any claudication-like symptoms, rest pain or ulceration to the bilateral lower extremity.  The patient continues to take her Plavix on a daily basis.  The patient denies any fever, nausea vomiting.  Review of Systems  Constitutional: Negative.   HENT: Negative.   Eyes: Negative.   Respiratory: Negative.   Cardiovascular: Negative.   Gastrointestinal: Negative.   Endocrine: Negative.   Genitourinary: Negative.   Musculoskeletal: Negative.   Skin: Negative.   Allergic/Immunologic: Negative.   Neurological: Negative.   Hematological: Negative.   Psychiatric/Behavioral: Negative.       Objective:   Physical Exam  Constitutional: She is oriented to person, place, and time. She appears well-developed and well-nourished. No distress.  HENT:  Head: Normocephalic and atraumatic.  Right Ear: External ear normal.  Left Ear: External ear normal.  Eyes: Pupils are equal, round, and reactive to light. Conjunctivae and EOM are normal.  Neck: Normal range of motion.  Cardiovascular: Normal rate, regular rhythm, normal heart sounds and intact distal pulses.  Pulses:      Radial pulses are 2+ on the right side, and 2+ on the left side.       Dorsalis pedis pulses are 1+ on the right side, and  1+ on the left side.       Posterior tibial pulses are 1+ on the right side, and 1+ on the left side.  Pulmonary/Chest: Effort normal and breath sounds normal.  Musculoskeletal: Normal range of motion. She exhibits no edema.  Neurological: She is alert and oriented to person, place, and time.  Skin: She is not diaphoretic.  Greater than 1 cm and less than 1 cm diffuse varicosities noted to the bilateral lower extremity.  There is mild stasis dermatitis noted.  There are no skin changes or active cellulitis or ulcerations noted to the lower extremity.  Vitals reviewed.  BP 119/75 (BP Location: Right Arm, Patient Position: Sitting)   Pulse (!) 52   Resp 14   Ht 5\' 2"  (1.575 m)   Wt 147 lb (66.7 kg)   BMI 26.89 kg/m   Past Medical History:  Diagnosis Date  . Anxiety   . Arthritis   . Asthma    as a child  . Cancer ([place])    uterine  . Cancer ([place])    skin  . GERD (gastroesophageal reflux disease)   . Glaucoma   . Heart murmur   . Hypertension   . MVP (mitral valve prolapse)    with repair 2008 at [place] History  . Marital status: Married    Spouse name: Not on file  . Number of children: Not on file  . Years of education: Not on file  . Highest education  level: Not on file  Occupational History  . Not on file  Social Needs  . Financial resource strain: Not on file  . Food insecurity:    Worry: Not on file    Inability: Not on file  . Transportation needs:    Medical: Not on file    Non-medical: Not on file  Tobacco Use  . Smoking status: Never Smoker  . Smokeless tobacco: Never Used  Substance and Sexual Activity  . Alcohol use: No    Comment: wine occaasionally  . Drug use: No  . Sexual activity: Not on file  Lifestyle  . Physical activity:    Days per week: Not on file    Minutes per session: Not on file  . Stress: Not on file  Relationships  . Social connections:    Talks on phone: Not on file    Gets together: Not on  file    Attends religious service: Not on file    Active member of club or organization: Not on file    Attends meetings of clubs or organizations: Not on file    Relationship status: Not on file  . Intimate partner violence:    Fear of current or ex partner: Not on file    Emotionally abused: Not on file    Physically abused: Not on file    Forced sexual activity: Not on file  Other Topics Concern  . Not on file  Social History Narrative  . Not on file   Past Surgical History:  Procedure Laterality Date  . ABDOMINAL HYSTERECTOMY     with BSO  . BREAST CYST ASPIRATION Bilateral    negative  . EYE SURGERY Left    repair detatched retina x 2, removal of "bubble' AND SCAR TISSUE IN EYE  . EYE SURGERY Left    laser treatment left eye x 3  . EYE SURGERY Bilateral    cataract extraction with IOL  . MITRAL VALVE REPAIR    . TOTAL HIP ARTHROPLASTY Left 10/15/2016   Procedure: LEFT TOTAL HIP ARTHROPLASTY ANTERIOR APPROACH;  Surgeon: Paralee Cancel, MD;  Location: [facility];  Service: Orthopedics;  Laterality: Left;   Family History  Problem Relation Age of Onset  . Breast cancer Paternal Aunt        two aunts in their 55's   Allergies  Allergen Reactions  . Atropine Other (See Comments)    Increased blood pressure  . Neomycin-Bacitracin Zn-Polymyx     Other reaction(s): Unknown Hypertension   . Promethazine Other (See Comments)    anxiety  . Tramadol Nausea And Vomiting    Patient also states flushing and throat was hoarse      Assessment & Plan:  Patient presents to review vascular studies.  The patient was last seen on February 07, 2018 for evaluation of lower extremity toe cyanosis.  The patient notes that since starting the Plavix she has not experienced any cyanosis or erythema to her toes.  The patient states that her symptoms have improved.  The patient underwent a bilateral ABI which was notable for triphasic tibials and normal great big toe brachial indices bilaterally.  A  left lower extremity arterial duplex was notable for triphasic blood flow distally.  The patient denies any claudication-like symptoms, rest pain or ulceration to the bilateral lower extremity.  The patient continues to take her Plavix on a daily basis.  The patient denies any fever, nausea vomiting.  1. Extremity cyanosis - Stable Patient with improved symptoms  on Plavix.  The patient like to continue taking the Plavix.  Since the patient is not having any side effects I do not see any issue with this. Significant arterial disease was found to the lower extremity. Patient to follow-up as needed and call the office if she should start to experience symptoms again  2. Hyperlipidemia, unspecified hyperlipidemia type - Stable Encouraged good control as its slows the progression of atherosclerotic disease  3. Asymptomatic varicose veins of both lower extremities - Stable The patient has undergone venous ablation in the past and does have a past medical history of chronic venous insufficiency The patient does have diffuse varicosities noted to the bilateral lower extremity. I recommend the patient undergo a venous duplex to rule out any new/worsening venous insufficiency that may be present. At this time, the patient is not interested in moving forward with this test. The patient was encouraged to wear medical grade 1 compression socks elevate her legs remain active The patient states that she would like to follow-up as needed will call the office if she experiences any additional symptoms.  Current Outpatient Medications on File Prior to Visit  Medication Sig Dispense Refill  . acetaminophen (TYLENOL) 500 MG tablet Take 500 mg by mouth every 6 (six) hours as needed for mild pain, fever or headache.     . Alpha-Lipoic Acid 600 MG CAPS Take by mouth.    . ALPRAZolam (XANAX) 0.25 MG tablet Take 0.25 mg by mouth 2 (two) times daily as needed for anxiety.     Marland Kitchen Apoaequorin (PREVAGEN) 10 MG CAPS Take by  mouth.    Marland Kitchen aspirin EC 81 MG tablet Take 81 mg by mouth at bedtime.     . brimonidine-timolol (COMBIGAN) 0.2-0.5 % ophthalmic solution Place 1 drop into both eyes every 12 (twelve) hours.    . brinzolamide (AZOPT) 1 % ophthalmic suspension Place 1 drop into both eyes 2 (two) times daily.    . calcium carbonate (OS-CAL - DOSED IN MG OF ELEMENTAL CALCIUM) 1250 (500 Ca) MG tablet Take by mouth.    . carboxymethylcellulose 1 % ophthalmic solution Apply 1 drop to eye as needed (dry eyes).    . clopidogrel (PLAVIX) 75 MG tablet Take 75 mg by mouth daily.  3  . gabapentin (NEURONTIN) 100 MG capsule Take 100 mg by mouth 3 (three) times daily as needed (pain).     Marland Kitchen lovastatin (MEVACOR) 40 MG tablet Take 40 mg by mouth at bedtime.    . magnesium oxide (MAG-OX) 400 MG tablet Take 400 mg by mouth daily.     . meloxicam (MOBIC) 7.5 MG tablet Take 7.5 mg by mouth daily as needed for pain.     . metoprolol tartrate (LOPRESSOR) 25 MG tablet Take 1 tablet (25 mg total) by mouth 2 (two) times daily. 60 tablet 0  . Multiple Vitamin (MULTIVITAMIN WITH MINERALS) TABS tablet Take 1 tablet by mouth daily.     Marland Kitchen neomycin-polymyxin b-dexamethasone (MAXITROL) 3.5-10000-0.1 SUSP     . Oyster Shell (OYSTER CALCIUM) 500 MG TABS tablet Take 500 mg of elemental calcium by mouth daily.    . vitamin C (ASCORBIC ACID) 500 MG tablet Take 500 mg by mouth daily.    . Vitamins/Minerals TABS Take by mouth.     No current facility-administered medications on file prior to visit.    There are no Patient Instructions on file for this visit. No follow-ups on file.  Kahleah Crass A Shekera Beavers, PA-C

## 2018-05-19 ENCOUNTER — Other Ambulatory Visit: Payer: Self-pay | Admitting: Internal Medicine

## 2018-05-19 DIAGNOSIS — Z1231 Encounter for screening mammogram for malignant neoplasm of breast: Secondary | ICD-10-CM

## 2018-06-09 IMAGING — CR DG CHEST 2V
1 series · 2 of 2 positions shown · non-contrast
Comparison: 01/05/2018 and earlier.

CLINICAL DATA: 72-year-old female with palpitations since this
morning.

EXAM:
CHEST - 2 VIEW

[Series 1: dg chest 2 view · 0.14mm/px · 2 of 2 slices shown]
[im 1/2]
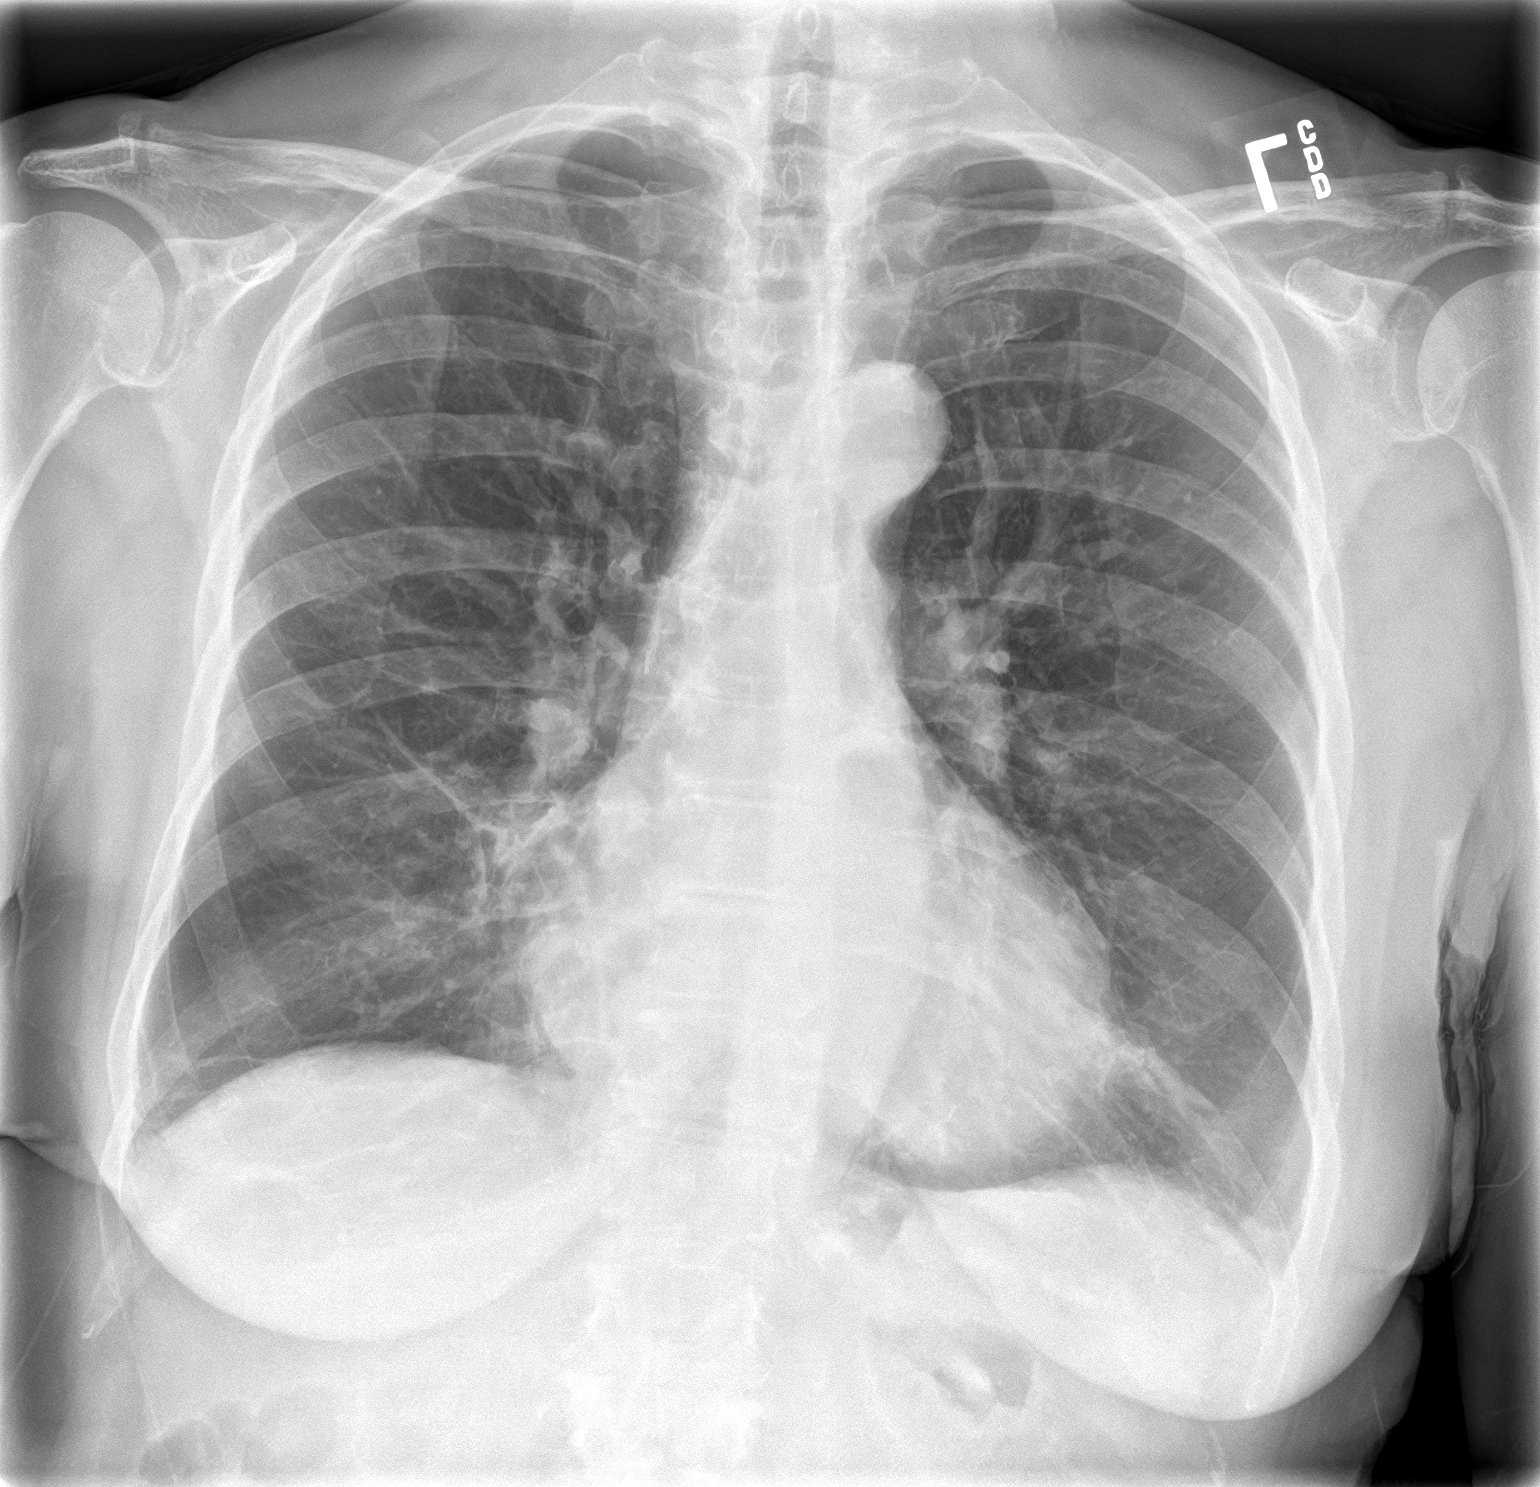
[im 2/2]
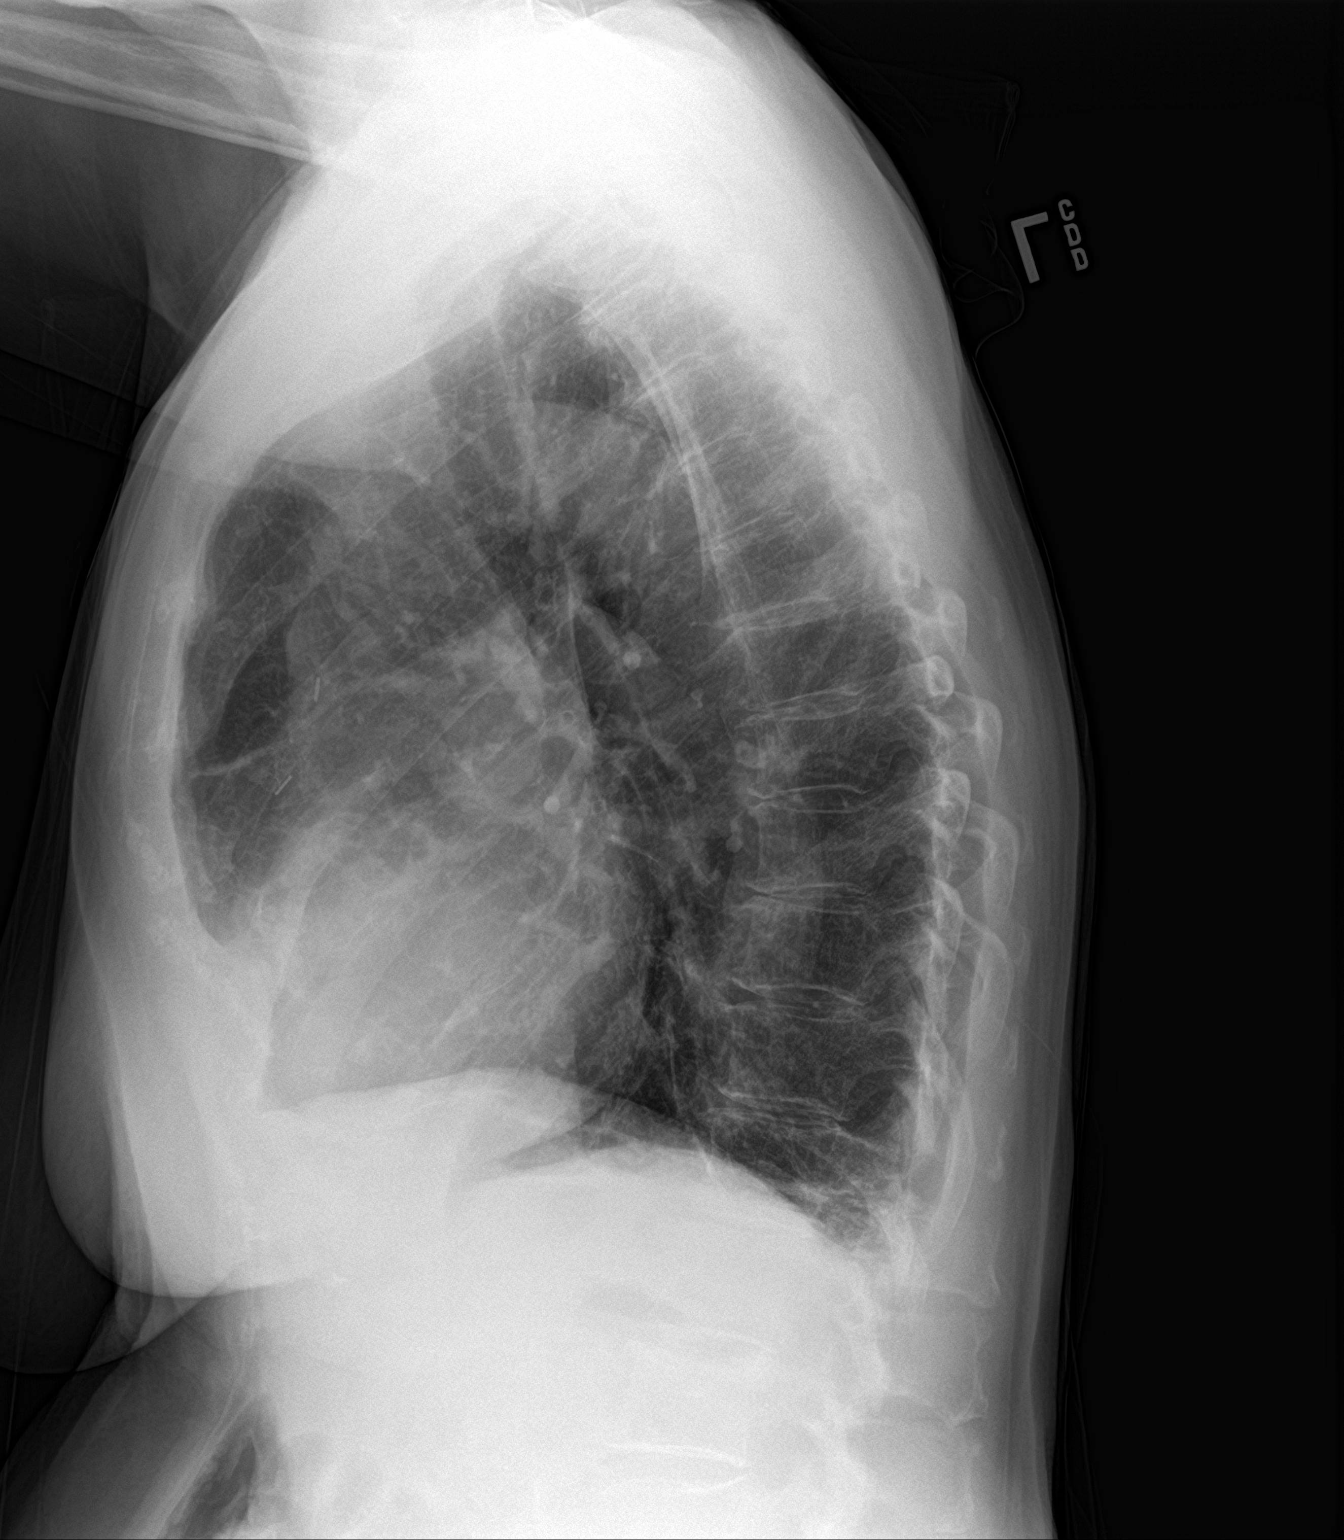

[2 of 2 positions shown; findings below may reference images not displayed]

FINDINGS: Stable mild-to-moderate cardiomegaly and mediastinal contours.
Stable large lung volumes and bilateral basilar predominant
curvilinear opacity which most resembles scarring. No pneumothorax,
pulmonary edema, pleural effusion, or acute pulmonary opacity.
Visualized tracheal air column is within normal limits. No acute
osseous abnormality identified. Negative visible bowel gas pattern.
IMPRESSION: 1.  No acute cardiopulmonary abnormality.
2. Stable cardiomegaly and bibasilar scarring.

## 2018-06-23 ENCOUNTER — Ambulatory Visit
Admission: RE | Admit: 2018-06-23 | Discharge: 2018-06-23 | Disposition: A | Discharge status: Home / Self Care | Payer: Medicare Other | Source: Ambulatory Visit | Attending: Internal Medicine | Admitting: Internal Medicine

## 2018-06-23 DIAGNOSIS — Z1231 Encounter for screening mammogram for malignant neoplasm of breast: Secondary | ICD-10-CM | POA: Diagnosis present

## 2018-08-13 ENCOUNTER — Ambulatory Visit (INDEPENDENT_AMBULATORY_CARE_PROVIDER_SITE_OTHER): Payer: Medicare Other | Admitting: Vascular Surgery

## 2018-08-13 ENCOUNTER — Ambulatory Visit (INDEPENDENT_AMBULATORY_CARE_PROVIDER_SITE_OTHER): Payer: Medicare Other

## 2018-08-13 ENCOUNTER — Other Ambulatory Visit (INDEPENDENT_AMBULATORY_CARE_PROVIDER_SITE_OTHER): Payer: Self-pay | Admitting: Vascular Surgery

## 2018-08-13 ENCOUNTER — Encounter (INDEPENDENT_AMBULATORY_CARE_PROVIDER_SITE_OTHER): Payer: Self-pay | Admitting: Vascular Surgery

## 2018-08-13 VITALS — BP 135/74 | HR 52 | Resp 15 | Ht 63.0 in | Wt 142.0 lb

## 2018-08-13 DIAGNOSIS — I8393 Asymptomatic varicose veins of bilateral lower extremities: Secondary | ICD-10-CM

## 2018-08-13 DIAGNOSIS — I890 Lymphedema, not elsewhere classified: Secondary | ICD-10-CM

## 2018-08-13 DIAGNOSIS — I872 Venous insufficiency (chronic) (peripheral): Secondary | ICD-10-CM | POA: Diagnosis not present

## 2018-08-13 NOTE — Progress Notes (Signed)
Subjective:    Patient ID: Whitney Pace, female    DOB: 1946/03/29, 72 y.o.   MRN: [id number] Chief Complaint  Patient presents with  . Follow-up    Bilateral Venous Reflux   Patient last seen in May 2018 in regard to a follow-up visit for lower extremity cyanosis, painful varicosities located to bilateral legs and lower extremity edema.  Since her initial visit, the patient was found to not have any contributing peripheral artery disease.  The patient has been engaging conservative therapy including wearing medical grade 1 compression socks, elevating her legs and remaining active.  The patient notes an improvement to all of her symptoms.  The patient does not experience any cyanosis, her edema has improved as well as the pain along her varicosities.  The patient denies any claudication-like symptoms, rest pain or ulcer formation to the bilateral lower extremity.  The patient denies any recent recurrent bouts of cellulitis.  The patient underwent a bilateral lower extremity venous reflux exam which was notable for right: Chronic venous insufficiency noted in the great saphenous vein from the knee to calf level which appears to possibly originate from a distal thigh level perforator.  Left: Chronic venous insufficiency was noted at the proximal thigh greater saphenous vein and in the popliteal vein.  Of note normal Doppler waveforms were noted in the distal anterior and posterior tibial arteries.  The patient denies any fever, nausea vomiting.  Review of Systems  Constitutional: Negative.   HENT: Negative.   Eyes: Negative.   Respiratory: Negative.   Cardiovascular: Positive for leg swelling.       Varicose veins  Gastrointestinal: Negative.   Endocrine: Negative.   Genitourinary: Negative.   Musculoskeletal: Negative.   Skin: Negative.   Allergic/Immunologic: Negative.   Neurological: Negative.   Hematological: Negative.   Psychiatric/Behavioral: Negative.       Objective:   Physical  Exam  Constitutional: She appears well-developed and well-nourished. No distress.  HENT:  Head: Normocephalic and atraumatic.  Right Ear: External ear normal.  Left Ear: External ear normal.  Eyes: Pupils are equal, round, and reactive to light. Conjunctivae and EOM are normal.  Neck: Normal range of motion.  Cardiovascular: Normal rate, regular rhythm, normal heart sounds and intact distal pulses.  Pulses:      Radial pulses are 2+ on the right side, and 2+ on the left side.       Dorsalis pedis pulses are 2+ on the right side, and 2+ on the left side.       Posterior tibial pulses are 2+ on the right side, and 2+ on the left side.  Pulmonary/Chest: Effort normal and breath sounds normal.  Musculoskeletal: Normal range of motion. She exhibits no edema (No edema noted on today's exam).  Skin: She is not diaphoretic.  Right lower extremity: Diffuse greater than 1cm and less than 1cm varicosities noted.  Mild stasis dermatitis. There is no fibrosis, cellulitis or active ulcerations noted. Left lower extremity: Less varicosities noted to the left lower extremity when compared to the right.  Mild stasis dermatitis.  There is no fibrosis, cellulitis or active ulcerations noted.  Vitals reviewed.  BP 135/74 (BP Location: Right Arm, Patient Position: Sitting)   Pulse (!) 52   Resp 15   Ht 5\' 3"  (1.6 m)   Wt 142 lb (64.4 kg)   BMI 25.15 kg/m   Past Medical History:  Diagnosis Date  . Anxiety   . Arthritis   . Asthma  as a child  . Cancer ([place])    uterine  . Cancer ([place])    skin  . GERD (gastroesophageal reflux disease)   . Glaucoma   . Heart murmur   . Hypertension   . MVP (mitral valve prolapse)    with repair 2008 at [place] History  . Marital status: Married    Spouse name: Not on file  . Number of children: Not on file  . Years of education: Not on file  . Highest education level: Not on file  Occupational History  . Not on file    Social Needs  . Financial resource strain: Not on file  . Food insecurity:    Worry: Not on file    Inability: Not on file  . Transportation needs:    Medical: Not on file    Non-medical: Not on file  Tobacco Use  . Smoking status: Never Smoker  . Smokeless tobacco: Never Used  Substance and Sexual Activity  . Alcohol use: No    Comment: wine occaasionally  . Drug use: No  . Sexual activity: Not on file  Lifestyle  . Physical activity:    Days per week: Not on file    Minutes per session: Not on file  . Stress: Not on file  Relationships  . Social connections:    Talks on phone: Not on file    Gets together: Not on file    Attends religious service: Not on file    Active member of club or organization: Not on file    Attends meetings of clubs or organizations: Not on file    Relationship status: Not on file  . Intimate partner violence:    Fear of current or ex partner: Not on file    Emotionally abused: Not on file    Physically abused: Not on file    Forced sexual activity: Not on file  Other Topics Concern  . Not on file  Social History Narrative  . Not on file   Past Surgical History:  Procedure Laterality Date  . ABDOMINAL HYSTERECTOMY     with BSO  . BREAST CYST ASPIRATION Bilateral    negative  . EYE SURGERY Left    repair detatched retina x 2, removal of "bubble' AND SCAR TISSUE IN EYE  . EYE SURGERY Left    laser treatment left eye x 3  . EYE SURGERY Bilateral    cataract extraction with IOL  . MITRAL VALVE REPAIR    . TOTAL HIP ARTHROPLASTY Left 10/15/2016   Procedure: LEFT TOTAL HIP ARTHROPLASTY ANTERIOR APPROACH;  Surgeon: Paralee Cancel, MD;  Location: [facility];  Service: Orthopedics;  Laterality: Left;   Family History  Problem Relation Age of Onset  . Breast cancer Paternal Aunt        two aunts in their 13's   Allergies  Allergen Reactions  . Atropine Other (See Comments)    Increased blood pressure  . Neomycin-Bacitracin Zn-Polymyx      Other reaction(s): Unknown Hypertension   . Promethazine Other (See Comments)    anxiety  . Tramadol Nausea And Vomiting    Patient also states flushing and throat was hoarse      Assessment & Plan:  Patient last seen in May 2018 in regard to a follow-up visit for lower extremity cyanosis, painful varicosities located to bilateral legs and lower extremity edema.  Since her initial visit, the patient was found to not have  any contributing peripheral artery disease.  The patient has been engaging conservative therapy including wearing medical grade 1 compression socks, elevating her legs and remaining active.  The patient notes an improvement to all of her symptoms.  The patient does not experience any cyanosis, her edema has improved as well as the pain along her varicosities.  The patient denies any claudication-like symptoms, rest pain or ulcer formation to the bilateral lower extremity.  The patient denies any recent recurrent bouts of cellulitis.  The patient underwent a bilateral lower extremity venous reflux exam which was notable for right: Chronic venous insufficiency noted in the great saphenous vein from the knee to calf level which appears to possibly originate from a distal thigh level perforator.  Left: Chronic venous insufficiency was noted at the proximal thigh greater saphenous vein and in the popliteal vein.  Of note normal Doppler waveforms were noted in the distal anterior and posterior tibial arteries.  The patient denies any fever, nausea vomiting.  1. Chronic venous insufficiency - Stable Small area of venous reflux noted to the right great saphenous vein at the knee level to calf. Reflux noted in the proximal left great saphenous vein and left popliteal vein. At this time, the patient would like to continue engaging conservative therapy at it is improving her symptoms. We did discuss moving forward with endovenous laser ablation however at this time the patient would like to  wait.  2. Lymphedema - New The discussed moving forward with a lymphedema pump. At this time, the patient's symptoms are being controlled to the use of conservative therapy.  3. 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 Also the patient back in approximately 4 to 5 months to continue to assess her progress with conservative therapy. If conservative therapy starts to fail at some point the patient understands that she has endovenous laser ablation or lymphedema pump as added therapies.  Current Outpatient Medications on File Prior to Visit  Medication Sig Dispense Refill  . acetaminophen (TYLENOL) 500 MG tablet Take 500 mg by mouth every 6 (six) hours as needed for mild pain, fever or headache.     . ALPRAZolam (XANAX) 0.25 MG tablet Take 0.25 mg by mouth 2 (two) times daily as needed for anxiety.     Marland Kitchen Apoaequorin (PREVAGEN) 10 MG CAPS Take by mouth.    Marland Kitchen aspirin EC 81 MG tablet Take 81 mg by mouth at bedtime.     . brimonidine-timolol (COMBIGAN) 0.2-0.5 % ophthalmic solution Place 1 drop into both eyes every 12 (twelve) hours.    . brinzolamide (AZOPT) 1 % ophthalmic suspension Place 1 drop into both eyes 2 (two) times daily.    . calcium carbonate (OS-CAL - DOSED IN MG OF ELEMENTAL CALCIUM) 1250 (500 Ca) MG tablet Take by mouth.    . carboxymethylcellulose 1 % ophthalmic solution Apply 1 drop to eye as needed (dry eyes).    . clopidogrel (PLAVIX) 75 MG tablet Take 75 mg by mouth daily.  3  . gabapentin (NEURONTIN) 100 MG capsule Take 100 mg by mouth 3 (three) times daily as needed (pain).     Marland Kitchen losartan (COZAAR) 50 MG tablet Take 50 mg by mouth daily.    Marland Kitchen lovastatin (MEVACOR) 40 MG tablet Take 40 mg by mouth at bedtime.    . magnesium  oxide (MAG-OX) 400 MG  tablet Take 400 mg by mouth daily.     . metoprolol tartrate (LOPRESSOR) 25 MG tablet Take 1 tablet (25 mg total) by mouth 2 (two) times daily. 60 tablet 0  . Multiple Vitamin (MULTIVITAMIN WITH MINERALS) TABS tablet Take 1 tablet by mouth daily.     [place] (OYSTER CALCIUM) 500 MG TABS tablet Take 500 mg of elemental calcium by mouth daily.    Marland Kitchen senna-docusate (SENOKOT-S) 8.6-50 MG tablet Take by mouth.    . vitamin C (ASCORBIC ACID) 500 MG tablet Take 500 mg by mouth daily.    Marland Kitchen ibuprofen (ADVIL,MOTRIN) 400 MG tablet Take 400 mg by mouth every 4 (four) hours as needed. for pain  0  . meloxicam (MOBIC) 7.5 MG tablet Take 7.5 mg by mouth daily as needed for pain.     Marland Kitchen neomycin-polymyxin b-dexamethasone (MAXITROL) 3.5-10000-0.1 SUSP      No current facility-administered medications on file prior to visit.    There are no Patient Instructions on file for this visit. No follow-ups on file.  Etai Copado A Caedin Mogan, PA-C

## 2018-11-04 ENCOUNTER — Telehealth (INDEPENDENT_AMBULATORY_CARE_PROVIDER_SITE_OTHER): Payer: Self-pay

## 2018-11-04 ENCOUNTER — Other Ambulatory Visit (INDEPENDENT_AMBULATORY_CARE_PROVIDER_SITE_OTHER): Payer: Self-pay | Admitting: Nurse Practitioner

## 2018-11-04 NOTE — Telephone Encounter (Signed)
Patient called stating she is wearing compression hose for her circulation issue. Patient states that on her left foot under the big toe there is a bruised area, but the skin is not broken. Patient wants to know should she come in to be seen. Spoke with Eulogio Ditch NP and per Arna Medici she may experience bruising due to the pressure from the compression hose and adjusting to wearing them. Patient's previous studies show good blood flow all the way to her toes. If the patient should experience any open areas then we may need to see her before her February appt. I had to leave a message for a return call.

## 2018-11-04 NOTE — Telephone Encounter (Signed)
Spoke with the patient and gave her Ronette Deter recommendations. Patient stated that it feels much better and that she has used a salve on it. I did let her know to contact us if the skin does break or she has any other related issues.

## 2018-12-17 ENCOUNTER — Encounter (INDEPENDENT_AMBULATORY_CARE_PROVIDER_SITE_OTHER): Payer: Self-pay | Admitting: Vascular Surgery

## 2018-12-17 ENCOUNTER — Encounter (INDEPENDENT_AMBULATORY_CARE_PROVIDER_SITE_OTHER): Payer: Self-pay

## 2018-12-17 ENCOUNTER — Other Ambulatory Visit: Payer: Self-pay

## 2018-12-17 ENCOUNTER — Ambulatory Visit (INDEPENDENT_AMBULATORY_CARE_PROVIDER_SITE_OTHER): Payer: Medicare HMO | Admitting: Vascular Surgery

## 2018-12-17 ENCOUNTER — Encounter (INDEPENDENT_AMBULATORY_CARE_PROVIDER_SITE_OTHER): Payer: Medicare HMO

## 2018-12-17 VITALS — BP 138/83 | HR 44 | Resp 10 | Ht 63.0 in | Wt 147.0 lb

## 2018-12-17 DIAGNOSIS — I8393 Asymptomatic varicose veins of bilateral lower extremities: Secondary | ICD-10-CM

## 2018-12-17 DIAGNOSIS — I872 Venous insufficiency (chronic) (peripheral): Secondary | ICD-10-CM

## 2018-12-17 DIAGNOSIS — I890 Lymphedema, not elsewhere classified: Secondary | ICD-10-CM | POA: Diagnosis not present

## 2018-12-17 NOTE — Progress Notes (Signed)
Subjective:    Patient ID: Whitney Pace, female    DOB: [birth year], 73 y.o.   MRN: [id number] Chief Complaint  Patient presents with  . Follow-up   Patient presents for a 60-month lymphedema/chronic venous insufficiency follow-up.  Since the patient's initial visit, she has been getting conservative therapy including wearing medical grade 1 compression socks, elevating her legs and remaining active.  The patient notes a market improvement in the edema and discomfort she had been experiencing.  The patient notes that her edema has resolved.  The patient also notes market improvement in her discomfort.  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  Patient denies any fever, nausea vomiting.  Patient denies any recurrent or recent bouts of cellulitis.  Patient denies any ulcer formation to the bilateral legs.  Patient denies any fever, nausea vomiting.  Review of Systems  Constitutional: Negative.   HENT: Negative.   Eyes: Negative.   Respiratory: Negative.   Cardiovascular: Negative.   Gastrointestinal: Negative.   Endocrine: Negative.   Genitourinary: Negative.   Musculoskeletal: Negative.   Skin: Negative.   Allergic/Immunologic: Negative.   Neurological: Negative.   Hematological: Negative.   Psychiatric/Behavioral: Negative.       Objective:   Physical Exam Vitals signs reviewed.  Constitutional:      Appearance: Normal appearance. She is normal weight.  HENT:     Head: Normocephalic and atraumatic.     Right Ear: External ear normal.     Left Ear: External ear normal.     Nose: Nose normal.     Mouth/Throat:     Mouth: Mucous membranes are moist.     Pharynx: Oropharynx is clear.  Eyes:     Extraocular Movements: Extraocular movements intact.     Conjunctiva/sclera: Conjunctivae normal.     Pupils: Pupils are equal, round, and reactive to light.  Neck:     Musculoskeletal: Normal range of motion.  Cardiovascular:     Rate and Rhythm: Normal rate and regular rhythm.     Pulses: Normal pulses.     Heart sounds: Normal heart sounds.  Pulmonary:     Effort: Pulmonary effort is normal.     Breath sounds: Normal breath sounds.  Musculoskeletal: Normal range of motion.        General: No swelling.  Skin:    General: Skin is warm and dry.  Neurological:     General: No focal deficit present.     Mental Status: She is alert and oriented to person, place, and time. Mental status is at baseline.  Psychiatric:        Mood and Affect: Mood normal.        Behavior: Behavior normal.        Thought Content: Thought content normal.        Judgment: Judgment normal.    BP 138/83 (BP Location: Left Arm, Patient Position: Sitting, Cuff Size: Small)   Pulse (!) 44   Resp 10   Ht 5\' 3"  (1.6 m)   Wt 147 lb (66.7 kg)   BMI 26.04 kg/m   Past Medical History:  Diagnosis Date  . Anxiety   . Arthritis   . Asthma    as a child  . Cancer ([place])    uterine  . Cancer ([place])    skin  . GERD (gastroesophageal reflux disease)   . Glaucoma   . Heart murmur   . Hypertension   .  MVP (mitral valve prolapse)    with repair 2008 at [facility] History  . Marital status: Married    Spouse name: Not on file  . Number of children: Not on file  . Years of education: Not on file  . Highest education level: Not on file  Occupational History  . Not on file  Social Needs  . Financial resource strain: Not on file  . Food insecurity:    Worry: Not on file    Inability: Not on file  . Transportation needs:    Medical: Not on file    Non-medical: Not on file  Tobacco Use  . Smoking status: Never Smoker  . Smokeless tobacco: Never Used  Substance and Sexual Activity  . Alcohol use: No    Comment: wine occaasionally  . Drug use: No  . Sexual activity: Not on file  Lifestyle  . Physical activity:    Days per week: Not on file    Minutes per session: Not on file  . Stress: Not on file    Relationships  . Social connections:    Talks on phone: Not on file    Gets together: Not on file    Attends religious service: Not on file    Active member of club or organization: Not on file    Attends meetings of clubs or organizations: Not on file    Relationship status: Not on file  . Intimate partner violence:    Fear of current or ex partner: Not on file    Emotionally abused: Not on file    Physically abused: Not on file    Forced sexual activity: Not on file  Other Topics Concern  . Not on file  Social History Narrative  . Not on file   Past Surgical History:  Procedure Laterality Date  . ABDOMINAL HYSTERECTOMY     with BSO  . BREAST CYST ASPIRATION Bilateral    negative  . EYE SURGERY Left    repair detatched retina x 2, removal of "bubble' AND SCAR TISSUE IN EYE  . EYE SURGERY Left    laser treatment left eye x 3  . EYE SURGERY Bilateral    cataract extraction with IOL  . MITRAL VALVE REPAIR    . TOTAL HIP ARTHROPLASTY Left 10/15/2016   Procedure: LEFT TOTAL HIP ARTHROPLASTY ANTERIOR APPROACH;  Surgeon: Paralee Cancel, MD;  Location: [facility];  Service: Orthopedics;  Laterality: Left;   Family History  Problem Relation Age of Onset  . Breast cancer Paternal Aunt        two aunts in their 54's   Allergies  Allergen Reactions  . Atropine Other (See Comments)    Increased blood pressure  . Neomycin-Bacitracin Zn-Polymyx     Other reaction(s): Unknown Hypertension   . Promethazine Other (See Comments)    anxiety  . Tramadol Nausea And Vomiting    Patient also states flushing and throat was hoarse      Assessment & Plan:  Patient presents for a 73-month lymphedema/chronic venous insufficiency follow-up.  Since the patient's initial visit, she has been getting conservative therapy including wearing medical grade 1 compression socks, elevating her legs and remaining active.  The patient notes a market improvement in the edema and discomfort she had been  experiencing.  The patient notes that her edema has resolved.  The patient also notes market improvement in her discomfort.  She also notes an improvement in the neuropathy she  has been diagnosed with in her bilateral feet.  Patient denies any fever, nausea vomiting.  Patient denies any recurrent or recent bouts of cellulitis.  Patient denies any ulcer formation to the bilateral legs.  Patient denies any fever, nausea vomiting.  1. Chronic venous insufficiency - Stable Since her initial visit the patient has been engaging conservative therapy including wearing medical grade 1 compression socks, elevating her legs and remaining active The patient has noted a market improvement in her edema, discomfort and neuropathic symptoms to her bilateral feet The patient is pleased with the improvement We had a discussion about adding a lymphedema pump to her daily therapy however at this time the patient is not interested The patient should continue to engage in conservative therapy on daily basis See the patient back in 1 year or as needed sooner  2. Lymphedema - Stable As above  3. Asymptomatic varicose veins of both lower extremities - Stable As Above  Current Outpatient Medications on File Prior to Visit  Medication Sig Dispense Refill  . acetaminophen (TYLENOL) 500 MG tablet Take 500 mg by mouth every 6 (six) hours as needed for mild pain, fever or headache.     . ALPRAZolam (XANAX) 0.25 MG tablet Take 0.25 mg by mouth 2 (two) times daily as needed for anxiety.     Marland Kitchen Apoaequorin (PREVAGEN) 10 MG CAPS Take by mouth.    Marland Kitchen aspirin EC 81 MG tablet Take 81 mg by mouth at bedtime.     . brimonidine-timolol (COMBIGAN) 0.2-0.5 % ophthalmic solution Place 1 drop into both eyes every 12 (twelve) hours.    . brinzolamide (AZOPT) 1 % ophthalmic suspension Place 1 drop into both eyes 2 (two) times daily.    . calcium carbonate (OS-CAL - DOSED IN MG OF ELEMENTAL CALCIUM) 1250 (500 Ca) MG tablet Take by mouth.      . carboxymethylcellulose 1 % ophthalmic solution Apply 1 drop to eye as needed (dry eyes).    . clopidogrel (PLAVIX) 75 MG tablet Take 75 mg by mouth daily.  3  . gabapentin (NEURONTIN) 100 MG capsule Take 100 mg by mouth 3 (three) times daily as needed (pain).     Marland Kitchen ibuprofen (ADVIL,MOTRIN) 400 MG tablet Take 400 mg by mouth every 4 (four) hours as needed. for pain  0  . losartan (COZAAR) 50 MG tablet Take 50 mg by mouth daily.    Marland Kitchen lovastatin (MEVACOR) 40 MG tablet Take 40 mg by mouth at bedtime.    . magnesium oxide (MAG-OX) 400 MG tablet Take 400 mg by mouth daily.     . meloxicam (MOBIC) 7.5 MG tablet Take 7.5 mg by mouth daily as needed for pain.     . metoprolol tartrate (LOPRESSOR) 25 MG tablet Take 1 tablet (25 mg total) by mouth 2 (two) times daily. 60 tablet 0  . Multiple Vitamin (MULTIVITAMIN WITH MINERALS) TABS tablet Take 1 tablet by mouth daily.     Marland Kitchen neomycin-polymyxin b-dexamethasone (MAXITROL) 3.5-10000-0.1 SUSP     . [place] (OYSTER CALCIUM) 500 MG TABS tablet Take 500 mg of elemental calcium by mouth daily.    Marland Kitchen senna-docusate (SENOKOT-S) 8.6-50 MG tablet Take by mouth.    . vitamin C (ASCORBIC ACID) 500 MG tablet Take 500 mg by mouth daily.     No current facility-administered medications on file prior to visit.    There are no Patient Instructions on file for this visit. No follow-ups on file.  Cherelle Midkiff A Quaneshia Wareing, PA-C

## 2018-12-24 DIAGNOSIS — Z8679 Personal history of other diseases of the circulatory system: Secondary | ICD-10-CM | POA: Insufficient documentation

## 2018-12-24 DIAGNOSIS — Z0000 Encounter for general adult medical examination without abnormal findings: Secondary | ICD-10-CM | POA: Insufficient documentation

## 2019-05-20 ENCOUNTER — Other Ambulatory Visit: Payer: Self-pay | Admitting: Internal Medicine

## 2019-05-20 DIAGNOSIS — Z1231 Encounter for screening mammogram for malignant neoplasm of breast: Secondary | ICD-10-CM

## 2019-08-04 ENCOUNTER — Ambulatory Visit
Admission: RE | Admit: 2019-08-04 | Discharge: 2019-08-04 | Disposition: A | Discharge status: Home / Self Care | Payer: Medicare HMO | Source: Ambulatory Visit | Attending: Internal Medicine | Admitting: Internal Medicine

## 2019-08-04 DIAGNOSIS — Z1231 Encounter for screening mammogram for malignant neoplasm of breast: Secondary | ICD-10-CM | POA: Diagnosis present

## 2019-10-14 DIAGNOSIS — F03A0 Unspecified dementia, mild, without behavioral disturbance, psychotic disturbance, mood disturbance, and anxiety: Secondary | ICD-10-CM | POA: Insufficient documentation

## 2019-10-15 ENCOUNTER — Other Ambulatory Visit: Payer: Self-pay | Admitting: Internal Medicine

## 2019-10-15 DIAGNOSIS — G9340 Encephalopathy, unspecified: Secondary | ICD-10-CM

## 2019-10-23 ENCOUNTER — Other Ambulatory Visit: Payer: Self-pay

## 2019-10-23 ENCOUNTER — Encounter: Payer: Self-pay | Admitting: *Deleted

## 2019-10-23 DIAGNOSIS — R112 Nausea with vomiting, unspecified: Secondary | ICD-10-CM | POA: Diagnosis not present

## 2019-10-23 DIAGNOSIS — Z8541 Personal history of malignant neoplasm of cervix uteri: Secondary | ICD-10-CM | POA: Diagnosis not present

## 2019-10-23 DIAGNOSIS — R070 Pain in throat: Secondary | ICD-10-CM | POA: Diagnosis not present

## 2019-10-23 DIAGNOSIS — Y69 Unspecified misadventure during surgical and medical care: Secondary | ICD-10-CM | POA: Diagnosis not present

## 2019-10-23 DIAGNOSIS — Z8659 Personal history of other mental and behavioral disorders: Secondary | ICD-10-CM | POA: Insufficient documentation

## 2019-10-23 DIAGNOSIS — R197 Diarrhea, unspecified: Secondary | ICD-10-CM | POA: Insufficient documentation

## 2019-10-23 DIAGNOSIS — Z96642 Presence of left artificial hip joint: Secondary | ICD-10-CM | POA: Diagnosis not present

## 2019-10-23 DIAGNOSIS — Z79899 Other long term (current) drug therapy: Secondary | ICD-10-CM | POA: Diagnosis not present

## 2019-10-23 DIAGNOSIS — T50905A Adverse effect of unspecified drugs, medicaments and biological substances, initial encounter: Secondary | ICD-10-CM | POA: Insufficient documentation

## 2019-10-23 DIAGNOSIS — Z7982 Long term (current) use of aspirin: Secondary | ICD-10-CM | POA: Diagnosis not present

## 2019-10-23 DIAGNOSIS — I10 Essential (primary) hypertension: Secondary | ICD-10-CM | POA: Diagnosis not present

## 2019-10-23 DIAGNOSIS — Z85828 Personal history of other malignant neoplasm of skin: Secondary | ICD-10-CM | POA: Insufficient documentation

## 2019-10-23 DIAGNOSIS — R519 Headache, unspecified: Secondary | ICD-10-CM | POA: Insufficient documentation

## 2019-10-23 DIAGNOSIS — T887XXA Unspecified adverse effect of drug or medicament, initial encounter: Secondary | ICD-10-CM | POA: Insufficient documentation

## 2019-10-23 DIAGNOSIS — R531 Weakness: Secondary | ICD-10-CM | POA: Diagnosis present

## 2019-10-23 DIAGNOSIS — J45909 Unspecified asthma, uncomplicated: Secondary | ICD-10-CM | POA: Insufficient documentation

## 2019-10-23 LAB — COMPREHENSIVE METABOLIC PANEL
ALT: 22 U/L (ref 0–44)
AST: 34 U/L (ref 15–41)
Albumin: 4.9 g/dL (ref 3.5–5.0)
Alkaline Phosphatase: 60 U/L (ref 38–126)
Anion gap: 9 (ref 5–15)
BUN: 14 mg/dL (ref 8–23)
CO2: 28 mmol/L (ref 22–32)
Calcium: 9.4 mg/dL (ref 8.9–10.3)
Chloride: 99 mmol/L (ref 98–111)
Creatinine, Ser: 0.7 mg/dL (ref 0.44–1.00)
GFR calc Af Amer: >60 mL/min (ref >60)
GFR calc non Af Amer: >60 mL/min (ref >60)
Glucose, Bld: 163 mg/dL — ABNORMAL HIGH (ref 70–99)
Potassium: 3.5 mmol/L (ref 3.5–5.1)
Sodium: 136 mmol/L (ref 135–145)
Total Bilirubin: 0.8 mg/dL (ref 0.3–1.2)
Total Protein: 7.6 g/dL (ref 6.5–8.1)

## 2019-10-23 LAB — URINALYSIS, COMPLETE (UACMP) WITH MICROSCOPIC
Bilirubin Urine: NEGATIVE
Glucose, UA: NEGATIVE mg/dL
Hgb urine dipstick: NEGATIVE
Ketones, ur: 5 mg/dL — AB
Nitrite: NEGATIVE
Protein, ur: NEGATIVE mg/dL
Specific Gravity, Urine: 1.012 (ref 1.005–1.030)
pH: 6 (ref 5.0–8.0)

## 2019-10-23 LAB — CBC
HCT: 42 % (ref 36.0–46.0)
Hemoglobin: 14.5 g/dL (ref 12.0–15.0)
MCH: 31 pg (ref 26.0–34.0)
MCHC: 34.5 g/dL (ref 30.0–36.0)
MCV: 89.9 fL (ref 80.0–100.0)
Platelets: 237 10*3/uL (ref 150–400)
RBC: 4.67 MIL/uL (ref 3.87–5.11)
RDW: 12.5 % (ref 11.5–15.5)
WBC: 9.6 10*3/uL (ref 4.0–10.5)
nRBC: 0 % (ref 0.0–0.2)

## 2019-10-23 LAB — LIPASE, BLOOD: Lipase: 29 U/L (ref 11–51)

## 2019-10-23 MED ORDER — ONDANSETRON HCL 4 MG/2ML IJ SOLN
4.0000 mg | Freq: Once | INTRAMUSCULAR | Status: AC | PRN
  Administered 2019-10-23: 4 mg via INTRAVENOUS
  Filled 2019-10-23: qty 2

## 2019-10-23 MED ORDER — SODIUM CHLORIDE 0.9% FLUSH: 3.0000 mL | Freq: Once | INTRAVENOUS | Status: DC

## 2019-10-23 NOTE — ED Triage Notes (Addendum)
Medication is used to treat dementia. Pt vomiting in triage.

## 2019-10-23 NOTE — ED Triage Notes (Signed)
Pt started new medication today Rivastigmine 4.5 mg. Pt c/o weakness, lightheadedness and "feeling strange". Pt is slow to answer questions and unable to recall why she is taking this new medication. Pt c/o vomiting starting today x 3-4.

## 2019-10-24 ENCOUNTER — Emergency Department
Admission: EM | Admit: 2019-10-24 | Discharge: 2019-10-24 | Disposition: A | Discharge status: Home / Self Care | Payer: Medicare HMO | Attending: Emergency Medicine | Admitting: Emergency Medicine

## 2019-10-24 DIAGNOSIS — R112 Nausea with vomiting, unspecified: Secondary | ICD-10-CM

## 2019-10-24 DIAGNOSIS — Z8659 Personal history of other mental and behavioral disorders: Secondary | ICD-10-CM

## 2019-10-24 DIAGNOSIS — T50905A Adverse effect of unspecified drugs, medicaments and biological substances, initial encounter: Secondary | ICD-10-CM

## 2019-10-24 LAB — TROPONIN I (HIGH SENSITIVITY): Troponin I (High Sensitivity): 6 ng/L (ref <18)

## 2019-10-24 NOTE — ED Provider Notes (Signed)
[facility] Emergency Department Provider Note   ____________________________________________   First MD Initiated Contact with Patient 10/24/19 [phone]     (approximate)  I have reviewed the triage vital signs and the nursing notes.   HISTORY  Chief Complaint Medication Reaction    HPI Whitney Pace is a 73 y.o. female with past medical history of dementia, hypertension presents to the ED for medication reaction.  Patient reports that she has previously been prescribed rivastigmine for her dementia but that her dose was recently increased.  Last night, she took the first pill at the higher dose and a about an hour later began "feeling strange".  She describes generalized weakness as well as nausea and multiple episodes of vomiting with diarrhea.  She describes a feeling of tightness in her throat as well as a headache, but did not have any chest pain or abdominal pain.  She denies any vision changes, no difficulty or weakness.  She received Zofran in triage and now states she is "feeling much better" upon my evaluation.        Past Medical History:  Diagnosis Date  . Anxiety   . Arthritis   . Asthma    as a child  . Cancer ([place])    uterine  . Cancer ([place])    skin  . GERD (gastroesophageal reflux disease)   . Glaucoma   . Heart murmur   . Hypertension   . MVP (mitral valve prolapse)    with repair 2008 at [facility]    Patient Active Problem List   Diagnosis Date Noted  . Chronic venous insufficiency 08/13/2018  . Lymphedema 08/13/2018  . Hyperlipidemia 03/12/2018  . Asymptomatic varicose veins of both lower extremities 03/12/2018  . Sinus arrhythmia 02/12/2018  . Essential hypertension 02/07/2018  . 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 ABDOMINAL HYSTERECTOMY     with BSO  . BREAST CYST ASPIRATION Bilateral    negative  . EYE SURGERY Left    repair detatched retina x 2, removal of "bubble' AND  SCAR TISSUE IN EYE  . EYE SURGERY Left    laser treatment left eye x 3  . EYE SURGERY Bilateral    cataract extraction with IOL  . MITRAL VALVE REPAIR    . TOTAL HIP ARTHROPLASTY Left 10/15/2016   Procedure: LEFT TOTAL HIP ARTHROPLASTY ANTERIOR APPROACH;  Surgeon: Paralee Cancel, MD;  Location: [facility] ORS;  Service: Orthopedics;  Laterality: Left;    Prior to Admission medications   Medication Sig Start Date End Date Taking? Authorizing Provider  acetaminophen (TYLENOL) 500 MG tablet Take 500 mg by mouth every 6 (six) hours as needed for mild pain, fever or headache.     [provider]  ALPRAZolam Duanne Moron) 0.25 MG tablet Take 0.25 mg by mouth 2 (two) times daily as needed for anxiety.     [provider]  Apoaequorin (PREVAGEN) 10 MG CAPS Take by mouth.    [provider]  aspirin EC 81 MG tablet Take 81 mg by mouth at bedtime.     [provider]  brimonidine-timolol (COMBIGAN) 0.2-0.5 % ophthalmic solution Place 1 drop into both eyes every 12 (twelve) hours.    [provider]  brinzolamide (AZOPT) 1 % ophthalmic suspension Place 1 drop into both eyes 2 (two) times daily.    [provider]  calcium carbonate (OS-CAL - DOSED IN MG OF ELEMENTAL CALCIUM) 1250 (500 Ca)  MG tablet Take by mouth.    [provider]  carboxymethylcellulose 1 % ophthalmic solution Apply 1 drop to eye as needed (dry eyes).    [provider]  clopidogrel (PLAVIX) 75 MG tablet Take 75 mg by mouth daily. 02/07/18   [provider]  gabapentin (NEURONTIN) 100 MG capsule Take 100 mg by mouth 3 (three) times daily as needed (pain).     [provider]  ibuprofen (ADVIL,MOTRIN) 400 MG tablet Take 400 mg by mouth every 4 (four) hours as needed. for pain 05/28/18   [provider]  losartan (COZAAR) 50 MG tablet Take 50 mg by mouth daily.    [provider]  lovastatin (MEVACOR) 40 MG tablet Take 40 mg by mouth at bedtime.     [provider]  magnesium oxide (MAG-OX) 400 MG tablet Take 400 mg by mouth daily.     [provider]  metoprolol tartrate (LOPRESSOR) 25 MG tablet Take 1 tablet (25 mg total) by mouth 2 (two) times daily. 02/13/18   Bettey Costa, MD  Multiple Vitamin (MULTIVITAMIN WITH MINERALS) TABS tablet Take 1 tablet by mouth daily.     [provider]  neomycin-polymyxin b-dexamethasone (MAXITROL) 3.5-10000-0.1 SUSP  02/07/18   [provider]  Loma Boston (OYSTER CALCIUM) 500 MG TABS tablet Take 500 mg of elemental calcium by mouth daily.    [provider]  senna-docusate (SENOKOT-S) 8.6-50 MG tablet Take by mouth.    [provider]  vitamin C (ASCORBIC ACID) 500 MG tablet Take 500 mg by mouth daily.    [provider]    Allergies Atropine, Neomycin-bacitracin zn-polymyx, Promethazine, and Tramadol  Family History  Problem Relation Age of Onset  . Breast cancer Paternal Aunt        two aunts in their 43's    Social History Social History   Tobacco Use  . Smoking status: Never Smoker  . Smokeless tobacco: Never Used  Substance Use Topics  . Alcohol use: No    Comment: wine occaasionally  . Drug use: No    Review of Systems  Constitutional: No fever/chills.  Positive for generalized weakness. Eyes: No visual changes. ENT: No sore throat. Cardiovascular: Denies chest pain. Respiratory: Denies shortness of breath. Gastrointestinal: No abdominal pain.  Positive for nausea and vomiting.  Positive for diarrhea.  No constipation. Genitourinary: Negative for dysuria. Musculoskeletal: Negative for back pain. Skin: Negative for rash. Neurological: Positive for headaches, negative for focal weakness or numbness.  ____________________________________________   PHYSICAL EXAM:  VITAL SIGNS: ED Triage Vitals  Enc Vitals Group     BP 10/23/19 2210 (!) 168/95     Pulse Rate 10/23/19 2210 66     Resp 10/23/19 2210 20     Temp  10/23/19 2210 (!) 94.7 F (34.8 C)     Temp Source 10/23/19 2210 Oral     SpO2 10/23/19 2210 100 %     Weight 10/23/19 2206 140 lb (63.5 kg)     Height 10/23/19 2206 5\' 2"  (1.575 m)     Head Circumference --      Peak Flow --      Pain Score 10/23/19 2206 0     Pain Loc --      Pain Edu? --      Excl. in [place]? --     Constitutional: Alert and oriented to person, place, time, and situation. Eyes: Conjunctivae are normal.  Pupils equal round and reactive to light bilaterally, extraocular  movements intact without nystagmus. Head: Atraumatic. Nose: No congestion/rhinnorhea. Mouth/Throat: Mucous membranes are moist. Neck: Normal ROM Cardiovascular: Normal rate, irregular rhythm. Grossly normal heart sounds. Respiratory: Normal respiratory effort.  No retractions. Lungs CTAB. Gastrointestinal: Soft and nontender. No distention. Genitourinary: deferred Musculoskeletal: No lower extremity tenderness nor edema. Neurologic:  Normal speech and language. No gross focal neurologic deficits are appreciated.  5-5 strength in bilateral upper and lower extremities, no pronator drift. Skin:  Skin is warm, dry and intact. No rash noted. Psychiatric: Mood and affect are normal. Speech and behavior are normal.  ____________________________________________   LABS (all labs ordered are listed, but only abnormal results are displayed)  Labs Reviewed  COMPREHENSIVE METABOLIC PANEL - Abnormal; Notable for the following components:      Result Value   Glucose, Bld 163 (*)    All other components within normal limits  URINALYSIS, COMPLETE (UACMP) WITH MICROSCOPIC - Abnormal; Notable for the following components:   Color, Urine YELLOW (*)    APPearance CLEAR (*)    Ketones, ur 5 (*)    Leukocytes,Ua LARGE (*)    Bacteria, UA RARE (*)    All other components within normal limits  URINE CULTURE  LIPASE, BLOOD  CBC  TROPONIN I (HIGH SENSITIVITY)  TROPONIN I (HIGH SENSITIVITY)    ____________________________________________  EKG  ED ECG REPORT I, Blake Divine, the attending physician, personally viewed and interpreted this ECG.   Date: 10/24/2019  EKG Time: 22:37  Rate: 54  Rhythm: normal sinus rhythm, PVC's  Axis: Normal  Intervals:none  ST&T Change: None   PROCEDURES  Procedure(s) performed (including Critical Care):  Procedures   ____________________________________________   INITIAL IMPRESSION / ASSESSMENT AND PLAN / ED COURSE       73 year old female with history of dementia and hypertension presents to the ED for acute episode of vomiting, diarrhea, headache, and disorientation after taking increased dose of rivastigmine.  On my evaluation, she reports feeling much better and seems to be at her baseline mental status per husband.  She is ANO x4 with no focal neurologic deficits.  Given this, do not feel head CT is indicated.  Nausea and vomiting is not apparent common side effect of rivastigmine and her episode is likely related to this medication.  There is no evidence of infectious process, she was initially noted to be hypothermic although I suspect this was an error as temp subsequently improved without intervention.  Vital signs otherwise do not suggest sepsis.  EKG shows normal sinus rhythm with frequent PVCs, explaining her apparent irregular rhythm.  Troponin found to be within normal limits and patient continues to feel well, is at her baseline mental status.  She has had no further nausea and vomiting.  I have counseled patient and her husband to discuss with PCP decreasing her dose of rivastigmine, however I have advised her to hold off on taking the medication for now until she can talk with her PCP.  Counseled to return to the ED for new or worsening symptoms, patient and husband agree with plan.      ____________________________________________   FINAL CLINICAL IMPRESSION(S) / ED DIAGNOSES  Final diagnoses:  Non-intractable  vomiting with nausea, unspecified vomiting type  History of dementia  Adverse effect of drug, initial encounter     ED Discharge Orders    None       Note:  This document was prepared using Dragon voice recognition software and may include unintentional dictation errors.   Blake Divine, MD 10/24/19  0631  

## 2019-12-23 ENCOUNTER — Encounter (INDEPENDENT_AMBULATORY_CARE_PROVIDER_SITE_OTHER): Payer: Self-pay | Admitting: Nurse Practitioner

## 2019-12-23 ENCOUNTER — Other Ambulatory Visit: Payer: Self-pay

## 2019-12-23 ENCOUNTER — Ambulatory Visit (INDEPENDENT_AMBULATORY_CARE_PROVIDER_SITE_OTHER): Payer: Medicare Other | Admitting: Nurse Practitioner

## 2019-12-23 VITALS — BP 143/82 | HR 62 | Resp 16 | Wt 147.8 lb

## 2019-12-23 DIAGNOSIS — I10 Essential (primary) hypertension: Secondary | ICD-10-CM | POA: Diagnosis not present

## 2019-12-23 DIAGNOSIS — E785 Hyperlipidemia, unspecified: Secondary | ICD-10-CM | POA: Diagnosis not present

## 2019-12-23 DIAGNOSIS — I890 Lymphedema, not elsewhere classified: Secondary | ICD-10-CM

## 2019-12-23 NOTE — Progress Notes (Signed)
SUBJECTIVE:  Patient ID: Whitney Pace, female    DOB: Mar 15, 1946, 74 y.o.   MRN: [id number] Chief Complaint  Patient presents with  . Follow-up    70yr lymphedema follow up    HPI  Whitney Pace is a 74 y.o. female The patient returns to the office for followup evaluation regarding leg swelling.  The swelling has improved quite a bit and the pain associated with swelling has decreased substantially. There have not been any interval development of a ulcerations or wounds.  Since the previous visit the patient has been wearing graduated compression stockings and has noted little significant improvement in the lymphedema. The patient has been using compression routinely morning until night.  The patient also states elevation during the day and exercise is being done too.     Past Medical History:  Diagnosis Date  . Anxiety   . Arthritis   . Asthma    as a child  . Cancer ([place])    uterine  . Cancer ([place])    skin  . GERD (gastroesophageal reflux disease)   . Glaucoma   . Heart murmur   . Hypertension   . MVP (mitral valve prolapse)    with repair 2008 at [facility]    Past Surgical History:  Procedure Laterality Date  . ABDOMINAL HYSTERECTOMY     with BSO  . BREAST CYST ASPIRATION Bilateral    negative  . EYE SURGERY Left    repair detatched retina x 2, removal of "bubble' AND SCAR TISSUE IN EYE  . EYE SURGERY Left    laser treatment left eye x 3  . EYE SURGERY Bilateral    cataract extraction with IOL  . MITRAL VALVE REPAIR    . TOTAL HIP ARTHROPLASTY Left 10/15/2016   Procedure: LEFT TOTAL HIP ARTHROPLASTY ANTERIOR APPROACH;  Surgeon: Paralee Cancel, MD;  Location: [facility];  Service: Orthopedics;  Laterality: Left;    Social History   Socioeconomic History  . Marital status: Married    Spouse name: Not on file  . Number of children: Not on file  . Years of education: Not on file  . Highest education level: Not on file  Occupational History  . Not on file  Tobacco  Use  . Smoking status: Never Smoker  . Smokeless tobacco: Never Used  Substance and Sexual Activity  . Alcohol use: No    Comment: wine occaasionally  . Drug use: No  . Sexual activity: Not on file  Other Topics Concern  . Not on file  Social History Narrative  . Not on file   Social Determinants of Health   Financial Resource Strain:   . Difficulty of Paying Living Expenses: Not on file  Food Insecurity:   . Worried About Charity fundraiser in the Last Year: Not on file  . Ran Out of Food in the Last Year: Not on file  Transportation Needs:   . Lack of Transportation (Medical): Not on file  . Lack of Transportation (Non-Medical): Not on file  Physical Activity:   . Days of Exercise per Week: Not on file  . Minutes of Exercise per Session: Not on file  Stress:   . Feeling of Stress : Not on file  Social Connections:   . Frequency of Communication with Friends and Family: Not on file  . Frequency of Social Gatherings with Friends and Family: Not on file  . Attends Religious Services: Not on file  . Active Member of Clubs  or Organizations: Not on file  . Attends Archivist Meetings: Not on file  . Marital Status: Not on file  Intimate Partner Violence:   . Fear of Current or Ex-Partner: Not on file  . Emotionally Abused: Not on file  . Physically Abused: Not on file  . Sexually Abused: Not on file    Family History  Problem Relation Age of Onset  . Breast cancer Paternal Aunt        two aunts in their 3's    Allergies  Allergen Reactions  . Atropine Other (See Comments)    Increased blood pressure  . Escitalopram Oxalate     Other reaction(s): Other (See Comments) Caused her to feel jittery  . Neomycin-Bacitracin Zn-Polymyx     Other reaction(s): Unknown Hypertension   . Promethazine Other (See Comments)    anxiety  . Rivastigmine Tartrate Nausea Only  . Tramadol Nausea And Vomiting    Patient also states flushing and throat was hoarse      Review of Systems   Review of Systems: Negative Unless Checked Constitutional: [] Weight loss  [] Fever  [] Chills Cardiac: [] Chest pain   []  Atrial Fibrillation  [] Palpitations   [] Shortness of breath when laying flat   [] Shortness of breath with exertion. [] Shortness of breath at rest Vascular:  [] Pain in legs with walking   [] Pain in legs with standing [] Pain in legs when laying flat   [] Claudication    [] Pain in feet when laying flat    [] History of DVT   [] Phlebitis   [x] Swelling in legs   [x] Varicose veins   [] Non-healing ulcers Pulmonary:   [] Uses home oxygen   [] Productive cough   [] Hemoptysis   [] Wheeze  [] COPD   [] Asthma Neurologic:  [] Dizziness   [] Seizures  [] Blackouts [] History of stroke   [] History of TIA  [] Aphasia   [] Temporary Blindness   [] Weakness or numbness in arm   [] Weakness or numbness in leg Musculoskeletal:   [] Joint swelling   [] Joint pain   [] Low back pain  []  History of Knee Replacement [x] Arthritis [] back Surgeries  []  Spinal Stenosis    Hematologic:  [] Easy bruising  [] Easy bleeding   [] Hypercoagulable state   [] Anemic Gastrointestinal:  [] Diarrhea   [] Vomiting  [] Gastroesophageal reflux/heartburn   [] Difficulty swallowing. [] Abdominal pain Genitourinary:  [] Chronic kidney disease   [] Difficult urination  [] Anuric   [] Blood in urine [] Frequent urination  [] Burning with urination   [] Hematuria Skin:  [] Rashes   [] Ulcers [] Wounds Psychological:  [] History of anxiety   []  History of major depression  [x]  Memory Difficulties      OBJECTIVE:   Physical Exam  BP (!) 143/82 (BP Location: Right Arm)   Pulse 62   Resp 16   Wt 147 lb 12.8 oz (67 kg)   BMI 27.03 kg/m   Gen: WD/WN, NAD Head: [place]/AT, No temporalis wasting.  Ear/Nose/Throat: Hearing grossly intact, nares w/o erythema or drainage Eyes: PER, EOMI, sclera nonicteric.  Neck: Supple, no masses.  No JVD.  Pulmonary:  Good air movement, no use of accessory muscles.  Cardiac: RRR Vascular: minimal edema   Vessel Right Left  Dorsalis Pedis Palpable Palpable  Posterior Tibial Palpable Palpable   Gastrointestinal: soft, non-distended. No guarding/no peritoneal signs.  Musculoskeletal: M/S 5/5 throughout.  No deformity or atrophy.  Neurologic: Pain and light touch intact in extremities.  Symmetrical.  Speech is fluent. Motor exam as listed above. Psychiatric: Judgment intact, Mood & affect appropriate for pt's clinical situation. Dermatologic: No Venous rashes.  No Ulcers Noted.  No changes consistent with cellulitis. Lymph : No Cervical lymphadenopathy, no lichenification or skin changes of chronic lymphedema.       ASSESSMENT AND PLAN:  1. Lymphedema  No surgery or intervention at this point in time.    I have reviewed my previous discussion with the patient regarding swelling and why it  causes symptoms.  The patient is doing well with compression and will continue wearing graduated compression stockings class 1 (20-30 mmHg) on a daily basis a prescription was given. The patient will  continue wearing the stockings first thing in the morning and removing them in the evening. The patient is instructed specifically not to sleep in the stockings.    In addition, behavioral modification including elevation during the day and exercise will be continued.    Patient should follow-up on an annual basis   2. Essential hypertension Continue antihypertensive medications as already ordered, these medications have been reviewed and there are no changes at this time.   3. Hyperlipidemia, unspecified hyperlipidemia type Continue statin as ordered and reviewed, no changes at this time    Current Outpatient Medications on File Prior to Visit  Medication Sig Dispense Refill  . acetaminophen (TYLENOL) 500 MG tablet Take 500 mg by mouth every 6 (six) hours as needed for mild pain, fever or headache.     . Alpha-Lipoic Acid 200 MG CAPS Take by mouth.    . ALPRAZolam (XANAX) 0.25 MG tablet Take 0.25 mg  by mouth 2 (two) times daily as needed for anxiety.     Marland Kitchen aspirin EC 81 MG tablet Take 81 mg by mouth at bedtime.     . brimonidine-timolol (COMBIGAN) 0.2-0.5 % ophthalmic solution Place 1 drop into both eyes every 12 (twelve) hours.    . brinzolamide (AZOPT) 1 % ophthalmic suspension Place 1 drop into both eyes 2 (two) times daily.    . carboxymethylcellulose 1 % ophthalmic solution Apply 1 drop to eye as needed (dry eyes).    Marland Kitchen galantamine (RAZADYNE) 4 MG tablet Take by mouth.    Marland Kitchen ibuprofen (ADVIL,MOTRIN) 400 MG tablet Take 400 mg by mouth every 4 (four) hours as needed. for pain  0  . losartan (COZAAR) 50 MG tablet Take 50 mg by mouth daily.    Marland Kitchen lovastatin (MEVACOR) 40 MG tablet Take 40 mg by mouth at bedtime.    . metoprolol tartrate (LOPRESSOR) 25 MG tablet Take 1 tablet (25 mg total) by mouth 2 (two) times daily. 60 tablet 0  . Multiple Vitamin (MULTIVITAMIN WITH MINERALS) TABS tablet Take 1 tablet by mouth daily.     Loma Boston (OYSTER CALCIUM) 500 MG TABS tablet Take 500 mg of elemental calcium by mouth daily.    Marland Kitchen venlafaxine XR (EFFEXOR-XR) 37.5 MG 24 hr capsule Take 37.5 mg by mouth daily.    . vitamin C (ASCORBIC ACID) 500 MG tablet Take 500 mg by mouth daily.    Marland Kitchen Apoaequorin (PREVAGEN) 10 MG CAPS Take by mouth.    . calcium carbonate (OS-CAL - DOSED IN MG OF ELEMENTAL CALCIUM) 1250 (500 Ca) MG tablet Take by mouth.    . clopidogrel (PLAVIX) 75 MG tablet Take 75 mg by mouth daily.  3  . gabapentin (NEURONTIN) 100 MG capsule Take 100 mg by mouth 3 (three) times daily as needed (pain).     . magnesium oxide (MAG-OX) 400 MG tablet Take 400 mg by mouth daily.     Marland Kitchen neomycin-polymyxin b-dexamethasone (MAXITROL) 3.5-10000-0.1  SUSP     . Netarsudil Dimesylate 0.02 % SOLN Apply to eye.    . senna-docusate (SENOKOT-S) 8.6-50 MG tablet Take by mouth.     No current facility-administered medications on file prior to visit.    There are no Patient Instructions on file for this  visit. No follow-ups on file.   Kris Hartmann, NP  This note was completed with Sales executive.  Any errors are purely unintentional.

## 2020-01-18 DIAGNOSIS — F330 Major depressive disorder, recurrent, mild: Secondary | ICD-10-CM | POA: Insufficient documentation

## 2020-04-08 ENCOUNTER — Other Ambulatory Visit: Payer: Self-pay | Admitting: Internal Medicine

## 2020-04-08 DIAGNOSIS — N63 Unspecified lump in unspecified breast: Secondary | ICD-10-CM

## 2020-04-13 ENCOUNTER — Ambulatory Visit
Admission: RE | Admit: 2020-04-13 | Discharge: 2020-04-13 | Disposition: A | Discharge status: Home / Self Care | Payer: Medicare Other | Source: Ambulatory Visit | Attending: Internal Medicine | Admitting: Internal Medicine

## 2020-04-13 DIAGNOSIS — N63 Unspecified lump in unspecified breast: Secondary | ICD-10-CM | POA: Diagnosis present

## 2020-04-13 DIAGNOSIS — N6001 Solitary cyst of right breast: Secondary | ICD-10-CM | POA: Insufficient documentation

## 2020-07-13 ENCOUNTER — Other Ambulatory Visit: Payer: Self-pay | Admitting: Internal Medicine

## 2020-07-13 DIAGNOSIS — Z1231 Encounter for screening mammogram for malignant neoplasm of breast: Secondary | ICD-10-CM

## 2020-08-10 ENCOUNTER — Ambulatory Visit
Admission: RE | Admit: 2020-08-10 | Discharge: 2020-08-10 | Disposition: A | Discharge status: Home / Self Care | Payer: Medicare Other | Source: Ambulatory Visit | Attending: Internal Medicine | Admitting: Internal Medicine

## 2020-08-10 ENCOUNTER — Other Ambulatory Visit: Payer: Self-pay

## 2020-08-10 DIAGNOSIS — Z1231 Encounter for screening mammogram for malignant neoplasm of breast: Secondary | ICD-10-CM | POA: Insufficient documentation

## 2020-12-23 ENCOUNTER — Ambulatory Visit (INDEPENDENT_AMBULATORY_CARE_PROVIDER_SITE_OTHER): Payer: Medicare Other | Admitting: Vascular Surgery

## 2021-01-13 ENCOUNTER — Encounter (INDEPENDENT_AMBULATORY_CARE_PROVIDER_SITE_OTHER): Payer: Self-pay | Admitting: Nurse Practitioner

## 2021-01-13 ENCOUNTER — Other Ambulatory Visit: Payer: Self-pay

## 2021-01-13 ENCOUNTER — Ambulatory Visit (INDEPENDENT_AMBULATORY_CARE_PROVIDER_SITE_OTHER): Payer: Medicare Other | Admitting: Nurse Practitioner

## 2021-01-13 VITALS — BP 116/72 | HR 63 | Resp 16 | Wt 140.0 lb

## 2021-01-13 DIAGNOSIS — I7300 Raynaud's syndrome without gangrene: Secondary | ICD-10-CM

## 2021-01-13 DIAGNOSIS — I10 Essential (primary) hypertension: Secondary | ICD-10-CM

## 2021-01-13 DIAGNOSIS — I890 Lymphedema, not elsewhere classified: Secondary | ICD-10-CM

## 2021-01-13 DIAGNOSIS — E785 Hyperlipidemia, unspecified: Secondary | ICD-10-CM | POA: Diagnosis not present

## 2021-01-14 ENCOUNTER — Encounter (INDEPENDENT_AMBULATORY_CARE_PROVIDER_SITE_OTHER): Payer: Self-pay | Admitting: Nurse Practitioner

## 2021-01-14 NOTE — Progress Notes (Signed)
Subjective:    Patient ID: Whitney Pace, female    DOB: [birth year], 75 y.o.   MRN: [id number] Chief Complaint  Patient presents with  . Follow-up    1 yr follow up    The patient presents today for follow-up for her lymphedema.  Today the patient swelling looks greatly improved.  The patient notes that she has been diligent with utilizing her medical grade 1 compression stockings.  She wears them daily and also elevates her legs regularly.  She denies the development of any ulcerations or wounds or weeping.  However what is concerning to the patient has the discoloration in her lower extremities.  The patient notes that her feet have a somewhat bluish discoloration but that the coloration change from a red to the pale.  She notes that sometimes it is uncomfortable but not consistently.  She notes that some spots on her toes become sore but there are no ulcerations.  She notes that sometimes her feet get cold throughout the day.  She denies any fever or chills.   Review of Systems  Cardiovascular: Negative for leg swelling.  Skin: Positive for color change. Negative for wound.  All other systems reviewed and are negative.      Objective:   Physical Exam Vitals reviewed.  HENT:     Head: Normocephalic.  Cardiovascular:     Rate and Rhythm: Normal rate.     Pulses: Normal pulses.  Pulmonary:     Effort: Pulmonary effort is normal.  Skin:    Capillary Refill: Capillary refill takes more than 3 seconds.  Neurological:     Mental Status: She is alert and oriented to person, place, and time.  Psychiatric:        Mood and Affect: Mood normal.        Behavior: Behavior normal.        Thought Content: Thought content normal.        Judgment: Judgment normal.     BP 116/72 (BP Location: Right Arm)   Pulse 63   Resp 16   Wt 140 lb (63.5 kg)   BMI 25.61 kg/m   Past Medical History:  Diagnosis Date  . Anxiety   . Arthritis   . Asthma    as a child  . Cancer ([place])    uterine   . Cancer ([place])    skin  . GERD (gastroesophageal reflux disease)   . Glaucoma   . Heart murmur   . Hypertension   . MVP (mitral valve prolapse)    with repair 2008 at [place] History  . Marital status: Married    Spouse name: Not on file  . Number of children: Not on file  . Years of education: Not on file  . Highest education level: Not on file  Occupational History  . Not on file  Tobacco Use  . Smoking status: Never Smoker  . Smokeless tobacco: Never Used  Substance and Sexual Activity  . Alcohol use: No    Comment: wine occaasionally  . Drug use: No  . Sexual activity: Not on file  Other Topics Concern  . Not on file  Social History Narrative  . Not on file   Social Determinants of Health   Financial Resource Strain: Not on file  Food Insecurity: Not on file  Transportation Needs: Not on file  Physical Activity: Not on file  Stress: Not on file  Social Connections: Not on  file  Intimate Partner Violence: Not on file    Past Surgical History:  Procedure Laterality Date  . ABDOMINAL HYSTERECTOMY     with BSO  . BREAST CYST ASPIRATION Bilateral    negative  . EYE SURGERY Left    repair detatched retina x 2, removal of "bubble' AND SCAR TISSUE IN EYE  . EYE SURGERY Left    laser treatment left eye x 3  . EYE SURGERY Bilateral    cataract extraction with IOL  . MITRAL VALVE REPAIR    . TOTAL HIP ARTHROPLASTY Left 10/15/2016   Procedure: LEFT TOTAL HIP ARTHROPLASTY ANTERIOR APPROACH;  Surgeon: Paralee Cancel, MD;  Location: [facility];  Service: Orthopedics;  Laterality: Left;    Family History  Problem Relation Age of Onset  . Breast cancer Paternal Aunt        two aunts in their 37's    Allergies  Allergen Reactions  . Atropine Other (See Comments)    Increased blood pressure  . Escitalopram Oxalate     Other reaction(s): Other (See Comments) Caused her to feel jittery  . Neomycin-Bacitracin Zn-Polymyx     Other  reaction(s): Unknown Hypertension   . Promethazine Other (See Comments)    anxiety  . Rivastigmine     Other reaction(s): Vomiting  . Rivastigmine Tartrate Nausea Only  . Tramadol Nausea And Vomiting    Patient also states flushing and throat was hoarse  . Venlafaxine     Other reaction(s): Hallucination    CBC Latest Ref Rng & Units 10/23/2019 02/11/2018 01/05/2018  WBC 4.0 - 10.5 K/uL 9.6 4.2 4.2  Hemoglobin 12.0 - 15.0 g/dL 14.5 14.4 14.2  Hematocrit 36.0 - 46.0 % 42.0 41.9 42.5  Platelets 150 - 400 K/uL 237 217 202      CMP     Component Value Date/Time   NA 136 10/23/2019 2234   NA 139 11/19/2014 1948   K 3.5 10/23/2019 2234   K 4.0 11/19/2014 1948   CL 99 10/23/2019 2234   CL 106 11/19/2014 1948   CO2 28 10/23/2019 2234   CO2 28 11/19/2014 1948   GLUCOSE 163 (H) 10/23/2019 2234   GLUCOSE 98 11/19/2014 1948   BUN 14 10/23/2019 2234   BUN 20 (H) 11/19/2014 1948   CREATININE 0.70 10/23/2019 2234   CREATININE 1.04 11/19/2014 1948   CALCIUM 9.4 10/23/2019 2234   CALCIUM 9.1 11/19/2014 1948   PROT 7.6 10/23/2019 2234   PROT 7.6 11/19/2014 1948   ALBUMIN 4.9 10/23/2019 2234   ALBUMIN 4.3 11/19/2014 1948   AST 34 10/23/2019 2234   AST 21 11/19/2014 1948   ALT 22 10/23/2019 2234   ALT 23 11/19/2014 1948   ALKPHOS 60 10/23/2019 2234   ALKPHOS 61 11/19/2014 1948   BILITOT 0.8 10/23/2019 2234   BILITOT 0.4 11/19/2014 1948   GFRNONAA >60 10/23/2019 2234   GFRNONAA 56 (L) 11/19/2014 1948   GFRAA >60 10/23/2019 2234   GFRAA >60 11/19/2014 1948     No results found.     Assessment & Plan:   1. Lymphedema Patient is doing very well with control of her lymphedema.  The patient is encouraged to continue to utilize her compression stockings daily.  She is also advised to continue to elevate her lower extremities as much as possible.  2. Raynaud's disease without gangrene Based upon the patient's current description of symptoms and is likely discoloration is related  to Raynaud's disease.  Currently she does not have any  open wounds or ulcerations.  Typically first-line treatment for Raynaud's disease includes calcium channel blockers however the patient has good control of her blood pressure currently.  Because of this I will forego introducing any medication for fear of lowering the patient's blood pressure to unsafe levels.  We will have the patient return at her convenience for ABIs to ensure there are no obvious issues with circulation - VAS [place] ABI WITH/WO TBI; Future  3. Essential hypertension Continue antihypertensive medications as already ordered, these medications have been reviewed and there are no changes at this time.   4. Hyperlipidemia, unspecified hyperlipidemia type Continue statin as ordered and reviewed, no changes at this time    Current Outpatient Medications on File Prior to Visit  Medication Sig Dispense Refill  . acetaminophen (TYLENOL) 500 MG tablet Take 500 mg by mouth every 6 (six) hours as needed for mild pain, fever or headache.     . Alpha-Lipoic Acid 200 MG CAPS Take by mouth.    . ALPRAZolam (XANAX) 0.25 MG tablet Take 0.25 mg by mouth 2 (two) times daily as needed for anxiety.     Marland Kitchen aspirin EC 81 MG tablet Take 81 mg by mouth at bedtime.     . brimonidine-timolol (COMBIGAN) 0.2-0.5 % ophthalmic solution Place 1 drop into both eyes every 12 (twelve) hours.    . carboxymethylcellulose 1 % ophthalmic solution Apply 1 drop to eye as needed (dry eyes).    . dorzolamide (TRUSOPT) 2 % ophthalmic solution 1 drop 3 (three) times daily.    Marland Kitchen gabapentin (NEURONTIN) 100 MG capsule Take 100 mg by mouth 3 (three) times daily as needed (pain).     Marland Kitchen galantamine (RAZADYNE) 4 MG tablet Take by mouth.    Marland Kitchen ibuprofen (ADVIL,MOTRIN) 400 MG tablet Take 400 mg by mouth every 4 (four) hours as needed. for pain  0  . lovastatin (MEVACOR) 40 MG tablet Take 40 mg by mouth at bedtime.    . metoprolol tartrate (LOPRESSOR) 25 MG tablet Take 1 tablet  (25 mg total) by mouth 2 (two) times daily. 60 tablet 0  . Multiple Vitamin (MULTIVITAMIN WITH MINERALS) TABS tablet Take 1 tablet by mouth daily.     Marland Kitchen omeprazole (PRILOSEC) 20 MG capsule Take 20 mg by mouth daily.    Marland Kitchen PARoxetine (PAXIL) 20 MG tablet Take 20 mg by mouth daily.    . vitamin C (ASCORBIC ACID) 500 MG tablet Take 500 mg by mouth daily.    Marland Kitchen Apoaequorin 10 MG CAPS Take by mouth. (Patient not taking: Reported on 01/13/2021)    . brinzolamide (AZOPT) 1 % ophthalmic suspension Place 1 drop into both eyes 2 (two) times daily. (Patient not taking: Reported on 01/13/2021)    . calcium carbonate (OS-CAL - DOSED IN MG OF ELEMENTAL CALCIUM) 1250 (500 Ca) MG tablet Take by mouth. (Patient not taking: Reported on 01/13/2021)    . clopidogrel (PLAVIX) 75 MG tablet Take 75 mg by mouth daily. (Patient not taking: Reported on 01/13/2021)  3  . losartan (COZAAR) 50 MG tablet Take 50 mg by mouth daily. (Patient not taking: Reported on 01/13/2021)    . magnesium oxide (MAG-OX) 400 MG tablet Take 400 mg by mouth daily.  (Patient not taking: Reported on 01/13/2021)    . neomycin-polymyxin b-dexamethasone (MAXITROL) 3.5-10000-0.1 SUSP  (Patient not taking: Reported on 01/13/2021)    . Netarsudil Dimesylate 0.02 % SOLN Apply to eye. (Patient not taking: Reported on 01/13/2021)    . [employer] (  OYSTER CALCIUM) 500 MG TABS tablet Take 500 mg of elemental calcium by mouth daily. (Patient not taking: Reported on 01/13/2021)    . senna-docusate (SENOKOT-S) 8.6-50 MG tablet Take by mouth. (Patient not taking: Reported on 01/13/2021)    . venlafaxine XR (EFFEXOR-XR) 37.5 MG 24 hr capsule Take 37.5 mg by mouth daily. (Patient not taking: Reported on 01/13/2021)     No current facility-administered medications on file prior to visit.    There are no Patient Instructions on file for this visit. No follow-ups on file.   Kris Hartmann, NP

## 2021-02-08 ENCOUNTER — Other Ambulatory Visit: Payer: Self-pay

## 2021-02-08 ENCOUNTER — Encounter (INDEPENDENT_AMBULATORY_CARE_PROVIDER_SITE_OTHER): Payer: Self-pay | Admitting: Nurse Practitioner

## 2021-02-08 ENCOUNTER — Ambulatory Visit (INDEPENDENT_AMBULATORY_CARE_PROVIDER_SITE_OTHER): Payer: Medicare Other

## 2021-02-08 ENCOUNTER — Ambulatory Visit (INDEPENDENT_AMBULATORY_CARE_PROVIDER_SITE_OTHER): Payer: Medicare Other | Admitting: Nurse Practitioner

## 2021-02-08 VITALS — Resp 16 | Wt 140.6 lb

## 2021-02-08 DIAGNOSIS — I10 Essential (primary) hypertension: Secondary | ICD-10-CM | POA: Diagnosis not present

## 2021-02-08 DIAGNOSIS — E785 Hyperlipidemia, unspecified: Secondary | ICD-10-CM

## 2021-02-08 DIAGNOSIS — I7300 Raynaud's syndrome without gangrene: Secondary | ICD-10-CM

## 2021-02-13 ENCOUNTER — Encounter (INDEPENDENT_AMBULATORY_CARE_PROVIDER_SITE_OTHER): Payer: Self-pay | Admitting: Nurse Practitioner

## 2021-02-13 NOTE — Progress Notes (Signed)
Subjective:    Patient ID: Whitney Pace, female    DOB: 06/01/1946, 75 y.o.   MRN: [id number] Chief Complaint  Patient presents with  . Follow-up    Ultrasound follow up    The patient presents today for follow-up evaluation of discoloration of her lower extremities.  The patient notes that her feet sometimes have a bluish discoloration.  She notes that the discoloration changes from a red to a pale sensation.  It does not happen consistently she notes it tends to happen more when it is cold.  She denies having any wounds or ulcerations however are some sore spots.  She denies any fever or chills.  She denies any claudication-like symptoms or rest pain.  Today noninvasive studies show an ABI 1.17 on the right and 1.10 on the left.  This is consistent with previous ABIs done in 2019.  The patient has triphasic tibial artery waveforms bilaterally with slightly dampened toe waveforms bilaterally.      Review of Systems  Skin: Positive for color change.  All other systems reviewed and are negative.      Objective:   Physical Exam Vitals reviewed.  HENT:     Head: Normocephalic.  Cardiovascular:     Rate and Rhythm: Normal rate.     Pulses:          Dorsalis pedis pulses are 1+ on the right side and 1+ on the left side.       Posterior tibial pulses are 1+ on the right side and 1+ on the left side.  Pulmonary:     Effort: Pulmonary effort is normal.  Neurological:     Mental Status: She is alert and oriented to person, place, and time.  Psychiatric:        Mood and Affect: Mood normal.        Behavior: Behavior normal.        Thought Content: Thought content normal.        Judgment: Judgment normal.     Resp 16   Wt 140 lb 9.6 oz (63.8 kg)   BMI 25.72 kg/m   Past Medical History:  Diagnosis Date  . Anxiety   . Arthritis   . Asthma    as a child  . Cancer ([place])    uterine  . Cancer ([place])    skin  . GERD (gastroesophageal reflux disease)   . Glaucoma   . Heart  murmur   . Hypertension   . MVP (mitral valve prolapse)    with repair 2008 at [place] History  . Marital status: Married    Spouse name: Not on file  . Number of children: Not on file  . Years of education: Not on file  . Highest education level: Not on file  Occupational History  . Not on file  Tobacco Use  . Smoking status: Never Smoker  . Smokeless tobacco: Never Used  Substance and Sexual Activity  . Alcohol use: No    Comment: wine occaasionally  . Drug use: No  . Sexual activity: Not on file  Other Topics Concern  . Not on file  Social History Narrative  . Not on file   Social Determinants of Health   Financial Resource Strain: Not on file  Food Insecurity: Not on file  Transportation Needs: Not on file  Physical Activity: Not on file  Stress: Not on file  Social Connections: Not on file  Intimate  Partner Violence: Not on file    Past Surgical History:  Procedure Laterality Date  . ABDOMINAL HYSTERECTOMY     with BSO  . BREAST CYST ASPIRATION Bilateral    negative  . EYE SURGERY Left    repair detatched retina x 2, removal of "bubble' AND SCAR TISSUE IN EYE  . EYE SURGERY Left    laser treatment left eye x 3  . EYE SURGERY Bilateral    cataract extraction with IOL  . MITRAL VALVE REPAIR    . TOTAL HIP ARTHROPLASTY Left 10/15/2016   Procedure: LEFT TOTAL HIP ARTHROPLASTY ANTERIOR APPROACH;  Surgeon: Paralee Cancel, MD;  Location: [facility];  Service: Orthopedics;  Laterality: Left;    Family History  Problem Relation Age of Onset  . Breast cancer Paternal Aunt        two aunts in their 28's    Allergies  Allergen Reactions  . Atropine Other (See Comments)    Increased blood pressure  . Escitalopram Oxalate     Other reaction(s): Other (See Comments) Caused her to feel jittery  . Neomycin-Bacitracin Zn-Polymyx     Other reaction(s): Unknown Hypertension   . Promethazine Other (See Comments)    anxiety  .  Rivastigmine     Other reaction(s): Vomiting  . Rivastigmine Tartrate Nausea Only  . Tramadol Nausea And Vomiting    Patient also states flushing and throat was hoarse  . Venlafaxine     Other reaction(s): Hallucination    CBC Latest Ref Rng & Units 10/23/2019 02/11/2018 01/05/2018  WBC 4.0 - 10.5 K/uL 9.6 4.2 4.2  Hemoglobin 12.0 - 15.0 g/dL 14.5 14.4 14.2  Hematocrit 36.0 - 46.0 % 42.0 41.9 42.5  Platelets 150 - 400 K/uL 237 217 202      CMP     Component Value Date/Time   NA 136 10/23/2019 2234   NA 139 11/19/2014 1948   K 3.5 10/23/2019 2234   K 4.0 11/19/2014 1948   CL 99 10/23/2019 2234   CL 106 11/19/2014 1948   CO2 28 10/23/2019 2234   CO2 28 11/19/2014 1948   GLUCOSE 163 (H) 10/23/2019 2234   GLUCOSE 98 11/19/2014 1948   BUN 14 10/23/2019 2234   BUN 20 (H) 11/19/2014 1948   CREATININE 0.70 10/23/2019 2234   CREATININE 1.04 11/19/2014 1948   CALCIUM 9.4 10/23/2019 2234   CALCIUM 9.1 11/19/2014 1948   PROT 7.6 10/23/2019 2234   PROT 7.6 11/19/2014 1948   ALBUMIN 4.9 10/23/2019 2234   ALBUMIN 4.3 11/19/2014 1948   AST 34 10/23/2019 2234   AST 21 11/19/2014 1948   ALT 22 10/23/2019 2234   ALT 23 11/19/2014 1948   ALKPHOS 60 10/23/2019 2234   ALKPHOS 61 11/19/2014 1948   BILITOT 0.8 10/23/2019 2234   BILITOT 0.4 11/19/2014 1948   GFRNONAA >60 10/23/2019 2234   GFRNONAA 56 (L) 11/19/2014 1948   GFRAA >60 10/23/2019 2234   GFRAA >60 11/19/2014 1948     VAS [place] ABI WITH/WO TBI  Result Date: 02/10/2021 LOWER EXTREMITY DOPPLER STUDY  Comparison Study: 03/12/2018 Performing Technologist: Charlane Ferretti RT (R)(VS)  Examination Guidelines: A complete evaluation includes at minimum, Doppler waveform signals and systolic blood pressure reading at the level of bilateral brachial, anterior tibial, and posterior tibial arteries, when vessel segments are accessible. Bilateral testing is considered an integral part of a complete examination. Photoelectric Plethysmograph (PPG)  waveforms and toe systolic pressure readings are included as required and additional duplex testing as  needed. Limited examinations for reoccurring indications may be performed as noted.  ABI Findings: +---------+------------------+-----+---------+--------+ Right    Rt Pressure (mmHg)IndexWaveform Comment  +---------+------------------+-----+---------+--------+ Brachial 139                                      +---------+------------------+-----+---------+--------+ ATA      150               1.08 triphasic         +---------+------------------+-----+---------+--------+ PTA      162               1.17 triphasic         +---------+------------------+-----+---------+--------+ Great Toe121               0.87 Dampened          +---------+------------------+-----+---------+--------+ +---------+------------------+-----+---------+-------+ Left     Lt Pressure (mmHg)IndexWaveform Comment +---------+------------------+-----+---------+-------+ Brachial 139                                     +---------+------------------+-----+---------+-------+ ATA      152               1.09 triphasic        +---------+------------------+-----+---------+-------+ PTA      153               1.10 triphasic        +---------+------------------+-----+---------+-------+ Great Toe118               0.85 Dampened         +---------+------------------+-----+---------+-------+ +-------+-----------+-----------+------------+------------+ ABI/TBIToday's ABIToday's TBIPrevious ABIPrevious TBI +-------+-----------+-----------+------------+------------+ Right  1.17       .87        1.07                     +-------+-----------+-----------+------------+------------+ Left   1.10       .85        1.08                     +-------+-----------+-----------+------------+------------+ Bilateral ABIs appear essentially unchanged compared to prior study on 03/12/2018.  Summary: Right: Resting  right ankle-brachial index is within normal range. No evidence of significant right lower extremity arterial disease. The right toe-brachial index is normal. Left: Resting left ankle-brachial index is within normal range. No evidence of significant left lower extremity arterial disease. The left toe-brachial index is normal.  *See table(s) above for measurements and observations.  Electronically signed by Leotis Pain MD on 02/10/2021 at 10:09:30 AM.   Final        Assessment & Plan:   1. Raynaud's disease without gangrene Patient has good perfusion based on her vascular studies.  The  Slightly diminished toe waveforms are consistent with what is seen with Raynaud's disease.  The patient is currently not experiencing ulcerations or symptoms.  Patient is advised that if she develops ulcerations or symptoms in her lower extremities she should contact her office otherwise we will proceed with conservative therapy.  2. Essential hypertension Continue antihypertensive medications as already ordered, these medications have been reviewed and there are no changes at this time.   3. Hyperlipidemia, unspecified hyperlipidemia type Continue statin as ordered and reviewed, no changes at this time    Current Outpatient Medications on File Prior to Visit  Medication  Sig Dispense Refill  . acetaminophen (TYLENOL) 500 MG tablet Take 500 mg by mouth every 6 (six) hours as needed for mild pain, fever or headache.     . Alpha-Lipoic Acid 200 MG CAPS Take by mouth.    . ALPRAZolam (XANAX) 0.25 MG tablet Take 0.25 mg by mouth 2 (two) times daily as needed for anxiety.     Marland Kitchen aspirin EC 81 MG tablet Take 81 mg by mouth at bedtime.     . brimonidine-timolol (COMBIGAN) 0.2-0.5 % ophthalmic solution Place 1 drop into both eyes every 12 (twelve) hours.    . carboxymethylcellulose 1 % ophthalmic solution Apply 1 drop to eye as needed (dry eyes).    . dorzolamide (TRUSOPT) 2 % ophthalmic solution 1 drop 3 (three) times  daily.    Marland Kitchen gabapentin (NEURONTIN) 100 MG capsule Take 100 mg by mouth 3 (three) times daily as needed (pain).     Marland Kitchen ibuprofen (ADVIL,MOTRIN) 400 MG tablet Take 400 mg by mouth every 4 (four) hours as needed. for pain  0  . metoprolol tartrate (LOPRESSOR) 25 MG tablet Take 1 tablet (25 mg total) by mouth 2 (two) times daily. 60 tablet 0  . Multiple Vitamin (MULTIVITAMIN WITH MINERALS) TABS tablet Take 1 tablet by mouth daily.     Marland Kitchen omeprazole (PRILOSEC) 20 MG capsule Take 20 mg by mouth daily.    Marland Kitchen PARoxetine (PAXIL) 20 MG tablet Take 20 mg by mouth daily.    . vitamin C (ASCORBIC ACID) 500 MG tablet Take 500 mg by mouth daily.    Marland Kitchen Apoaequorin 10 MG CAPS Take by mouth. (Patient not taking: No sig reported)    . brinzolamide (AZOPT) 1 % ophthalmic suspension Place 1 drop into both eyes 2 (two) times daily. (Patient not taking: No sig reported)    . calcium carbonate (OS-CAL - DOSED IN MG OF ELEMENTAL CALCIUM) 1250 (500 Ca) MG tablet Take by mouth. (Patient not taking: No sig reported)    . clopidogrel (PLAVIX) 75 MG tablet Take 75 mg by mouth daily. (Patient not taking: No sig reported)  3  . galantamine (RAZADYNE) 4 MG tablet Take by mouth.    . losartan (COZAAR) 50 MG tablet Take 50 mg by mouth daily. (Patient not taking: No sig reported)    . lovastatin (MEVACOR) 40 MG tablet Take 40 mg by mouth at bedtime. (Patient not taking: Reported on 02/08/2021)    . magnesium oxide (MAG-OX) 400 MG tablet Take 400 mg by mouth daily.  (Patient not taking: No sig reported)    . neomycin-polymyxin b-dexamethasone (MAXITROL) 3.5-10000-0.1 SUSP  (Patient not taking: No sig reported)    . Netarsudil Dimesylate 0.02 % SOLN Apply to eye. (Patient not taking: No sig reported)    . Oyster Shell (OYSTER CALCIUM) 500 MG TABS tablet Take 500 mg of elemental calcium by mouth daily. (Patient not taking: No sig reported)    . senna-docusate (SENOKOT-S) 8.6-50 MG tablet Take by mouth. (Patient not taking: No sig reported)     . venlafaxine XR (EFFEXOR-XR) 37.5 MG 24 hr capsule Take 37.5 mg by mouth daily. (Patient not taking: No sig reported)     No current facility-administered medications on file prior to visit.    There are no Patient Instructions on file for this visit. No follow-ups on file.   Kris Hartmann, NP

## 2021-07-26 ENCOUNTER — Other Ambulatory Visit: Payer: Self-pay | Admitting: Internal Medicine

## 2021-07-31 ENCOUNTER — Other Ambulatory Visit: Payer: Self-pay | Admitting: Internal Medicine

## 2021-07-31 DIAGNOSIS — Z1231 Encounter for screening mammogram for malignant neoplasm of breast: Secondary | ICD-10-CM

## 2021-08-15 ENCOUNTER — Ambulatory Visit
Admission: RE | Admit: 2021-08-15 | Discharge: 2021-08-15 | Disposition: A | Discharge status: Home / Self Care | Payer: Medicare Other | Source: Ambulatory Visit | Attending: Internal Medicine | Admitting: Internal Medicine

## 2021-08-15 ENCOUNTER — Other Ambulatory Visit: Payer: Self-pay

## 2021-08-15 DIAGNOSIS — Z1231 Encounter for screening mammogram for malignant neoplasm of breast: Secondary | ICD-10-CM | POA: Diagnosis not present

## 2021-08-22 ENCOUNTER — Other Ambulatory Visit: Payer: Self-pay | Admitting: Internal Medicine

## 2021-08-22 DIAGNOSIS — R928 Other abnormal and inconclusive findings on diagnostic imaging of breast: Secondary | ICD-10-CM

## 2021-08-22 DIAGNOSIS — N6489 Other specified disorders of breast: Secondary | ICD-10-CM

## 2021-09-04 ENCOUNTER — Ambulatory Visit
Admission: RE | Admit: 2021-09-04 | Discharge: 2021-09-04 | Disposition: A | Discharge status: Home / Self Care | Payer: Medicare Other | Source: Ambulatory Visit | Attending: Internal Medicine | Admitting: Internal Medicine

## 2021-09-04 ENCOUNTER — Other Ambulatory Visit: Payer: Self-pay

## 2021-09-04 DIAGNOSIS — R928 Other abnormal and inconclusive findings on diagnostic imaging of breast: Secondary | ICD-10-CM | POA: Insufficient documentation

## 2021-09-04 DIAGNOSIS — N6489 Other specified disorders of breast: Secondary | ICD-10-CM | POA: Insufficient documentation

## 2022-01-02 ENCOUNTER — Other Ambulatory Visit: Payer: Self-pay

## 2022-01-02 ENCOUNTER — Ambulatory Visit (INDEPENDENT_AMBULATORY_CARE_PROVIDER_SITE_OTHER): Payer: Medicare Other | Admitting: Vascular Surgery

## 2022-01-02 ENCOUNTER — Encounter (INDEPENDENT_AMBULATORY_CARE_PROVIDER_SITE_OTHER): Payer: Self-pay | Admitting: Vascular Surgery

## 2022-01-02 VITALS — BP 134/83 | HR 80 | Resp 16 | Ht 64.0 in | Wt 136.0 lb

## 2022-01-02 DIAGNOSIS — Z8679 Personal history of other diseases of the circulatory system: Secondary | ICD-10-CM

## 2022-01-02 DIAGNOSIS — E782 Mixed hyperlipidemia: Secondary | ICD-10-CM

## 2022-01-02 DIAGNOSIS — I7300 Raynaud's syndrome without gangrene: Secondary | ICD-10-CM | POA: Insufficient documentation

## 2022-01-02 DIAGNOSIS — I872 Venous insufficiency (chronic) (peripheral): Secondary | ICD-10-CM

## 2022-01-02 DIAGNOSIS — I10 Essential (primary) hypertension: Secondary | ICD-10-CM

## 2022-01-02 NOTE — Assessment & Plan Note (Signed)
blood pressure control important in reducing the progression of atherosclerotic disease. On appropriate oral medications.  

## 2022-01-02 NOTE — Progress Notes (Signed)
MRN : [id number]  Whitney Pace is a 76 y.o. (Mar 27, 1946) female who presents with chief complaint of  Chief Complaint  Patient presents with   Follow-up    1 yr  no studies  .  History of Present Illness: Patient returns today in follow up of her feet and lower leg discoloration.  She has noticed more purplish discoloration particularly in the left foot and lower leg.  She has been wearing her compression socks and trying to elevate her legs.  She has a known history of Raynaud's disease as well as venous insufficiency in the past.  Her foot is painful.  Particularly in the middle and the forefoot portion of the left foot.  The right foot has some symptoms as well but not as severe as the left.  Occasional swelling.  No fevers or chills.  No wounds or open ulcerations.  No trauma or injury or exacerbating things that she notes precipitating the worsening of symptoms.  Current Outpatient Medications  Medication Sig Dispense Refill   Alpha-Lipoic Acid 200 MG CAPS Take by mouth.     ALPRAZolam (XANAX) 0.25 MG tablet Take 0.25 mg by mouth 2 (two) times daily as needed for anxiety.      ALPRAZolam (XANAX) 0.25 MG tablet Take 1 tablet by mouth 2 (two) times daily as needed.     aspirin EC 81 MG tablet Take 81 mg by mouth at bedtime.      brimonidine-timolol (COMBIGAN) 0.2-0.5 % ophthalmic solution Place 1 drop into both eyes every 12 (twelve) hours.     carboxymethylcellulose 1 % ophthalmic solution Apply 1 drop to eye as needed (dry eyes).     dorzolamide (TRUSOPT) 2 % ophthalmic solution 1 drop 3 (three) times daily.     ibuprofen (ADVIL,MOTRIN) 400 MG tablet Take 400 mg by mouth every 4 (four) hours as needed. for pain  0   ketorolac (ACULAR) 0.4 % SOLN Place 1 drop into the left eye 4 (four) times daily.     lovastatin (MEVACOR) 40 MG tablet Take 40 mg by mouth at bedtime.     metoprolol tartrate (LOPRESSOR) 25 MG tablet Take 1 tablet (25 mg total) by mouth 2 (two) times daily. 60 tablet 0    Multiple Vitamin (MULTIVITAMIN WITH MINERALS) TABS tablet Take 1 tablet by mouth daily.      omeprazole (PRILOSEC) 20 MG capsule Take 20 mg by mouth daily.     PARoxetine (PAXIL) 20 MG tablet Take 20 mg by mouth daily.     predniSONE (DELTASONE) 10 MG tablet Take by mouth.     triamcinolone cream (KENALOG) 0.1 % Apply topically 2 (two) times daily.     vitamin C (ASCORBIC ACID) 500 MG tablet Take 500 mg by mouth daily.     acetaminophen (TYLENOL) 500 MG tablet Take 500 mg by mouth every 6 (six) hours as needed for mild pain, fever or headache.  (Patient not taking: Reported on 01/02/2022)     Apoaequorin 10 MG CAPS Take by mouth. (Patient not taking: Reported on 01/13/2021)     brinzolamide (AZOPT) 1 % ophthalmic suspension Place 1 drop into both eyes 2 (two) times daily. (Patient not taking: Reported on 01/13/2021)     calcium carbonate (OS-CAL - DOSED IN MG OF ELEMENTAL CALCIUM) 1250 (500 Ca) MG tablet Take by mouth. (Patient not taking: Reported on 01/13/2021)     clopidogrel (PLAVIX) 75 MG tablet Take 75 mg by mouth daily. (Patient not taking: Reported on  01/13/2021)  3   gabapentin (NEURONTIN) 100 MG capsule Take 100 mg by mouth 3 (three) times daily as needed (pain).  (Patient not taking: Reported on 01/02/2022)     galantamine (RAZADYNE) 4 MG tablet Take by mouth.     losartan (COZAAR) 50 MG tablet Take 50 mg by mouth daily. (Patient not taking: Reported on 01/02/2022)     magnesium oxide (MAG-OX) 400 MG tablet Take 400 mg by mouth daily.  (Patient not taking: Reported on 01/13/2021)     neomycin-polymyxin b-dexamethasone (MAXITROL) 3.5-10000-0.1 SUSP  (Patient not taking: Reported on 01/13/2021)     Netarsudil Dimesylate 0.02 % SOLN Apply to eye. (Patient not taking: Reported on 01/13/2021)     Oyster Shell (OYSTER CALCIUM) 500 MG TABS tablet Take 500 mg of elemental calcium by mouth daily. (Patient not taking: Reported on 01/13/2021)     senna-docusate (SENOKOT-S) 8.6-50 MG tablet Take by mouth.  (Patient not taking: Reported on 01/13/2021)     venlafaxine XR (EFFEXOR-XR) 37.5 MG 24 hr capsule Take 37.5 mg by mouth daily. (Patient not taking: Reported on 01/13/2021)     No current facility-administered medications for this visit.    Past Medical History:  Diagnosis Date   Anxiety    Arthritis    Asthma    as a child   Cancer ([place])    uterine   Cancer ([place])    skin   GERD (gastroesophageal reflux disease)    Glaucoma    Heart murmur    Hypertension    MVP (mitral valve prolapse)    with repair 2008 at [facility]    Past Surgical History:  Procedure Laterality Date   ABDOMINAL HYSTERECTOMY     with BSO   BREAST CYST ASPIRATION Bilateral    negative   EYE SURGERY Left    repair detatched retina x 2, removal of "bubble' AND SCAR TISSUE IN EYE   EYE SURGERY Left    laser treatment left eye x 3   EYE SURGERY Bilateral    cataract extraction with IOL   MITRAL VALVE REPAIR     TOTAL HIP ARTHROPLASTY Left 10/15/2016   Procedure: LEFT TOTAL HIP ARTHROPLASTY ANTERIOR APPROACH;  Surgeon: Paralee Cancel, MD;  Location: [facility];  Service: Orthopedics;  Laterality: Left;     Social History   Tobacco Use   Smoking status: Never   Smokeless tobacco: Never  Substance Use Topics   Alcohol use: No    Comment: wine occaasionally   Drug use: No      Family History  Problem Relation Age of Onset   Breast cancer Paternal Aunt        two aunts in their 28's     Allergies  Allergen Reactions   Atropine Other (See Comments)    Increased blood pressure   Escitalopram Oxalate     Other reaction(s): Other (See Comments) Caused her to feel jittery   Neomycin-Bacitracin Zn-Polymyx     Other reaction(s): Unknown Hypertension    Promethazine Other (See Comments)    anxiety   Rivastigmine     Other reaction(s): Vomiting   Rivastigmine Tartrate Nausea Only   Tramadol Nausea And Vomiting    Patient also states flushing and throat was hoarse   Venlafaxine     Other reaction(s):  Hallucination     REVIEW OF SYSTEMS (Negative unless checked)  Constitutional: [] Weight loss  [] Fever  [] Chills Cardiac: [] Chest pain   [] Chest pressure   [] Palpitations   [] Shortness of breath  when laying flat   [] Shortness of breath at rest   [] Shortness of breath with exertion. Vascular:  [] Pain in legs with walking   [] Pain in legs at rest   [] Pain in legs when laying flat   [] Claudication   [] Pain in feet when walking  [] Pain in feet at rest  [] Pain in feet when laying flat   [] History of DVT   [] Phlebitis   [x] Swelling in legs   [x] Varicose veins   [] Non-healing ulcers Pulmonary:   [] Uses home oxygen   [] Productive cough   [] Hemoptysis   [] Wheeze  [] COPD   [x] Asthma Neurologic:  [] Dizziness  [] Blackouts   [] Seizures   [] History of stroke   [] History of TIA  [] Aphasia   [] Temporary blindness   [] Dysphagia   [] Weakness or numbness in arms   [] Weakness or numbness in legs Musculoskeletal:  [] Arthritis   [] Joint swelling   [] Joint pain   [] Low back pain Hematologic:  [] Easy bruising  [] Easy bleeding   [] Hypercoagulable state   [] Anemic   Gastrointestinal:  [] Blood in stool   [] Vomiting blood  [x] Gastroesophageal reflux/heartburn   [] Abdominal pain Genitourinary:  [] Chronic kidney disease   [] Difficult urination  [] Frequent urination  [] Burning with urination   [] Hematuria Skin:  [] Rashes   [] Ulcers   [] Wounds Psychological:  [x] History of anxiety   []  History of major depression.  Physical Examination  BP 134/83 (BP Location: Right Arm)    Pulse 80    Resp 16    Ht 5\' 4"  (1.626 m)    Wt 136 lb (61.7 kg)    BMI 23.34 kg/m  Gen:  WD/WN, NAD. Appears younger than stated age. Head: [place]/AT, No temporalis wasting. Ear/Nose/Throat: Hearing grossly intact, nares w/o erythema or drainage Eyes: Conjunctiva clear. Sclera non-icteric Neck: Supple.  Trachea midline Pulmonary:  Good air movement, no use of accessory muscles.  Cardiac: RRR, no JVD Vascular:  Vessel Right Left  Radial Palpable  Palpable                          PT 1+ palpable 1+ palpable  DP 1+ palpable 1+ palpable   Musculoskeletal: M/S 5/5 throughout.  No edema.  Significant purplish discoloration of both feet from the midfoot distally.  Sluggish capillary refill.  This does improve with some elevation.  Left is a little worse than the right. Neurologic: Sensation grossly intact in extremities.  Symmetrical.  Speech is fluent.  Psychiatric: Judgment intact, Mood & affect appropriate for pt's clinical situation. Dermatologic: No rashes or ulcers noted.  No cellulitis or open wounds.      Labs No results found for this or any previous visit (from the past 2160 hour(s)).  Radiology No results found.  Assessment/Plan  Essential hypertension blood pressure control important in reducing the progression of atherosclerotic disease. On appropriate oral medications.   Combined hyperlipidemia lipid control important in reducing the progression of atherosclerotic disease. Continue statin therapy   Chronic venous insufficiency Has previously had what sounds like an ablation on the left leg.  Prominent varicosities are present and she has significant stasis changes we will get a venous reflux study in the near future at her convenience  Raynaud disease In the past, has had normal arterial perfusion with Raynaud's findings.  I think it would be reasonable to repeat ABIs as it has been a while since this has been checked and her symptoms have worsened.    Leotis Pain, MD  01/02/2022 11:03 AM  This note was created with Dragon medical transcription system.  Any errors from dictation are purely unintentional

## 2022-01-02 NOTE — Assessment & Plan Note (Signed)
lipid control important in reducing the progression of atherosclerotic disease. Continue statin therapy  

## 2022-01-02 NOTE — Assessment & Plan Note (Signed)
Has previously had what sounds like an ablation on the left leg.  Prominent varicosities are present and she has significant stasis changes we will get a venous reflux study in the near future at her convenience

## 2022-01-02 NOTE — Assessment & Plan Note (Signed)
In the past, has had normal arterial perfusion with Raynaud's findings.  I think it would be reasonable to repeat ABIs as it has been a while since this has been checked and her symptoms have worsened.

## 2022-01-09 ENCOUNTER — Other Ambulatory Visit: Payer: Self-pay | Admitting: Internal Medicine

## 2022-01-09 DIAGNOSIS — G9349 Other encephalopathy: Secondary | ICD-10-CM

## 2022-01-09 DIAGNOSIS — R519 Headache, unspecified: Secondary | ICD-10-CM

## 2022-01-23 ENCOUNTER — Ambulatory Visit
Admission: RE | Admit: 2022-01-23 | Discharge: 2022-01-23 | Disposition: A | Discharge status: Home / Self Care | Payer: Medicare Other | Source: Ambulatory Visit | Attending: Internal Medicine | Admitting: Internal Medicine

## 2022-01-23 ENCOUNTER — Other Ambulatory Visit: Payer: Self-pay

## 2022-01-23 DIAGNOSIS — G9349 Other encephalopathy: Secondary | ICD-10-CM | POA: Insufficient documentation

## 2022-01-23 DIAGNOSIS — R519 Headache, unspecified: Secondary | ICD-10-CM | POA: Diagnosis present

## 2022-02-02 ENCOUNTER — Encounter (INDEPENDENT_AMBULATORY_CARE_PROVIDER_SITE_OTHER): Payer: Medicare Other

## 2022-02-02 ENCOUNTER — Ambulatory Visit (INDEPENDENT_AMBULATORY_CARE_PROVIDER_SITE_OTHER): Payer: Medicare Other | Admitting: Nurse Practitioner

## 2022-02-13 ENCOUNTER — Ambulatory Visit (INDEPENDENT_AMBULATORY_CARE_PROVIDER_SITE_OTHER): Payer: Medicare Other | Admitting: Vascular Surgery

## 2022-03-09 ENCOUNTER — Encounter (INDEPENDENT_AMBULATORY_CARE_PROVIDER_SITE_OTHER): Payer: Medicare Other

## 2022-03-09 ENCOUNTER — Ambulatory Visit (INDEPENDENT_AMBULATORY_CARE_PROVIDER_SITE_OTHER): Payer: Medicare Other | Admitting: Nurse Practitioner

## 2022-03-20 ENCOUNTER — Ambulatory Visit (INDEPENDENT_AMBULATORY_CARE_PROVIDER_SITE_OTHER): Payer: Medicare Other | Admitting: Vascular Surgery

## 2022-03-27 ENCOUNTER — Ambulatory Visit (INDEPENDENT_AMBULATORY_CARE_PROVIDER_SITE_OTHER): Payer: Medicare Other

## 2022-03-27 ENCOUNTER — Encounter (INDEPENDENT_AMBULATORY_CARE_PROVIDER_SITE_OTHER): Payer: Self-pay | Admitting: Vascular Surgery

## 2022-03-27 ENCOUNTER — Ambulatory Visit (INDEPENDENT_AMBULATORY_CARE_PROVIDER_SITE_OTHER): Payer: Medicare Other | Admitting: Vascular Surgery

## 2022-03-27 VITALS — BP 146/85 | HR 76 | Resp 16 | Ht 62.0 in | Wt 129.0 lb

## 2022-03-27 DIAGNOSIS — I7300 Raynaud's syndrome without gangrene: Secondary | ICD-10-CM | POA: Diagnosis not present

## 2022-03-27 DIAGNOSIS — I10 Essential (primary) hypertension: Secondary | ICD-10-CM

## 2022-03-27 DIAGNOSIS — I890 Lymphedema, not elsewhere classified: Secondary | ICD-10-CM

## 2022-03-27 DIAGNOSIS — I872 Venous insufficiency (chronic) (peripheral): Secondary | ICD-10-CM

## 2022-03-27 DIAGNOSIS — E782 Mixed hyperlipidemia: Secondary | ICD-10-CM | POA: Diagnosis not present

## 2022-03-27 NOTE — Assessment & Plan Note (Signed)
Symptom control is quite good with elevation, compression, and activity.  Continue conservative measures.

## 2022-03-27 NOTE — Progress Notes (Signed)
MRN : [id number]  Whitney Pace is a 76 y.o. ([birth year]) female who presents with chief complaint of No chief complaint on file. Marland Kitchen  History of Present Illness: Patient returns today in follow up of multiple issues.  She is getting both arterial and venous studies today.  She has Raynaud's disease with symptom control which has been quite good.  Compression socks and elevation have kept her swelling to a minimum at this point.  No open wounds or infection.  No fevers or chills.  Her pain at this point is quite mild.  Arterial studies today showed no significant arterial insufficiency in either lower extremity with multiphasic waveforms bilaterally and ABIs of approximately 1.1 bilaterally.  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  There is also reflux in the left deep venous system and the great saphenous vein in the thigh but not at the saphenofemoral junction on the left.  Current Outpatient Medications  Medication Sig Dispense Refill   Alpha-Lipoic Acid 200 MG CAPS Take by mouth.     ALPRAZolam (XANAX) 0.25 MG tablet Take 0.25 mg by mouth 2 (two) times daily as needed for anxiety.      aspirin EC 81 MG tablet Take 81 mg by mouth at bedtime.      brimonidine-timolol (COMBIGAN) 0.2-0.5 % ophthalmic solution Place 1 drop into both eyes every 12 (twelve) hours.     carboxymethylcellulose 1 % ophthalmic solution Apply 1 drop to eye as needed (dry eyes).     dorzolamide (TRUSOPT) 2 % ophthalmic solution 1 drop 3 (three) times daily.     gabapentin (NEURONTIN) 100 MG capsule Take 100 mg by mouth 3 (three) times daily as needed (pain).     galantamine (RAZADYNE) 12 MG tablet Take by mouth.     ibuprofen (ADVIL,MOTRIN) 400 MG tablet Take 400 mg by mouth every 4 (four) hours as needed. for pain  0   ketorolac  (ACULAR) 0.4 % SOLN Place 1 drop into the left eye 4 (four) times daily.     losartan (COZAAR) 50 MG tablet Take 50 mg by mouth daily.     lovastatin (MEVACOR) 40 MG tablet Take 40 mg by mouth at bedtime.     memantine (NAMENDA) 5 MG tablet Take 1 tablet by mouth 2 (two) times daily.     metoprolol tartrate (LOPRESSOR) 25 MG tablet Take 1 tablet (25 mg total) by mouth 2 (two) times daily. 60 tablet 0   Multiple Vitamin (MULTIVITAMIN WITH MINERALS) TABS tablet Take 1 tablet by mouth daily.      PARoxetine (PAXIL) 20 MG tablet Take 20 mg by mouth daily.     predniSONE (DELTASONE) 10 MG tablet Take by mouth.     vitamin C (ASCORBIC ACID) 500 MG tablet Take 500 mg by mouth daily.     acetaminophen (TYLENOL) 500 MG tablet Take 500 mg by mouth every 6 (six) hours as needed for mild pain, fever or headache.  (Patient not taking: Reported on 01/02/2022)     Apoaequorin 10 MG CAPS Take by mouth. (Patient not taking: Reported on 01/13/2021)     brinzolamide (AZOPT) 1 % ophthalmic suspension Place 1 drop into both eyes 2 (two) times daily. (Patient not taking: Reported on 01/13/2021)     calcium carbonate (OS-CAL -  DOSED IN MG OF ELEMENTAL CALCIUM) 1250 (500 Ca) MG tablet Take by mouth. (Patient not taking: Reported on 01/13/2021)     clopidogrel (PLAVIX) 75 MG tablet Take 75 mg by mouth daily. (Patient not taking: Reported on 01/13/2021)  3   galantamine (RAZADYNE) 4 MG tablet Take by mouth.     magnesium oxide (MAG-OX) 400 MG tablet Take 400 mg by mouth daily.  (Patient not taking: Reported on 01/13/2021)     neomycin-polymyxin b-dexamethasone (MAXITROL) 3.5-10000-0.1 SUSP  (Patient not taking: Reported on 03/27/2022)     Netarsudil Dimesylate 0.02 % SOLN Apply to eye. (Patient not taking: Reported on 01/13/2021)     omeprazole (PRILOSEC) 20 MG capsule Take 20 mg by mouth daily. (Patient not taking: Reported on 03/27/2022)     Oyster Shell (OYSTER CALCIUM) 500 MG TABS tablet Take 500 mg of elemental calcium by  mouth daily. (Patient not taking: Reported on 01/13/2021)     senna-docusate (SENOKOT-S) 8.6-50 MG tablet Take by mouth. (Patient not taking: Reported on 01/13/2021)     venlafaxine XR (EFFEXOR-XR) 37.5 MG 24 hr capsule Take 37.5 mg by mouth daily. (Patient not taking: Reported on 01/13/2021)     No current facility-administered medications for this visit.    Past Medical History:  Diagnosis Date   Anxiety    Arthritis    Asthma    as a child   Cancer ([place])    uterine   Cancer ([place])    skin   GERD (gastroesophageal reflux disease)    Glaucoma    Heart murmur    Hypertension    MVP (mitral valve prolapse)    with repair 2008 at [facility]    Past Surgical History:  Procedure Laterality Date   ABDOMINAL HYSTERECTOMY     with BSO   BREAST CYST ASPIRATION Bilateral    negative   EYE SURGERY Left    repair detatched retina x 2, removal of "bubble' AND SCAR TISSUE IN EYE   EYE SURGERY Left    laser treatment left eye x 3   EYE SURGERY Bilateral    cataract extraction with IOL   MITRAL VALVE REPAIR     TOTAL HIP ARTHROPLASTY Left 10/15/2016   Procedure: LEFT TOTAL HIP ARTHROPLASTY ANTERIOR APPROACH;  Surgeon: Paralee Cancel, MD;  Location: [facility];  Service: Orthopedics;  Laterality: Left;     Social History   Tobacco Use   Smoking status: Never   Smokeless tobacco: Never  Substance Use Topics   Alcohol use: No    Comment: wine occaasionally   Drug use: No      Family History  Problem Relation Age of Onset   Breast cancer Paternal Aunt        two aunts in their 14's    Allergies  Allergen Reactions   Atropine Other (See Comments)    Increased blood pressure   Escitalopram Oxalate     Other reaction(s): Other (See Comments) Caused her to feel jittery   Neomycin-Bacitracin Zn-Polymyx     Other reaction(s): Unknown Hypertension    Promethazine Other (See Comments)    anxiety   Rivastigmine     Other reaction(s): Vomiting   Rivastigmine Tartrate Nausea Only    Tramadol Nausea And Vomiting    Patient also states flushing and throat was hoarse   Venlafaxine     Other reaction(s): Hallucination    REVIEW OF SYSTEMS (Negative unless checked)   Constitutional: '[]'$ Weight loss  '[]'$ Fever  '[]'$ Chills Cardiac: '[]'$ Chest pain   '[]'$   Chest pressure   '[]'$ Palpitations   '[]'$ Shortness of breath when laying flat   '[]'$ Shortness of breath at rest   '[]'$ Shortness of breath with exertion. Vascular:  '[]'$ Pain in legs with walking   '[]'$ Pain in legs at rest   '[]'$ Pain in legs when laying flat   '[]'$ Claudication   '[]'$ Pain in feet when walking  '[]'$ Pain in feet at rest  '[]'$ Pain in feet when laying flat   '[]'$ History of DVT   '[]'$ Phlebitis   '[x]'$ Swelling in legs   '[x]'$ Varicose veins   '[]'$ Non-healing ulcers Pulmonary:   '[]'$ Uses home oxygen   '[]'$ Productive cough   '[]'$ Hemoptysis   '[]'$ Wheeze  '[]'$ COPD   '[x]'$ Asthma Neurologic:  '[]'$ Dizziness  '[]'$ Blackouts   '[]'$ Seizures   '[]'$ History of stroke   '[]'$ History of TIA  '[]'$ Aphasia   '[]'$ Temporary blindness   '[]'$ Dysphagia   '[]'$ Weakness or numbness in arms   '[]'$ Weakness or numbness in legs Musculoskeletal:  '[]'$ Arthritis   '[]'$ Joint swelling   '[]'$ Joint pain   '[]'$ Low back pain Hematologic:  '[]'$ Easy bruising  '[]'$ Easy bleeding   '[]'$ Hypercoagulable state   '[]'$ Anemic   Gastrointestinal:  '[]'$ Blood in stool   '[]'$ Vomiting blood  '[x]'$ Gastroesophageal reflux/heartburn   '[]'$ Abdominal pain Genitourinary:  '[]'$ Chronic kidney disease   '[]'$ Difficult urination  '[]'$ Frequent urination  '[]'$ Burning with urination   '[]'$ Hematuria Skin:  '[]'$ Rashes   '[]'$ Ulcers   '[]'$ Wounds Psychological:  '[x]'$ History of anxiety   '[]'$  History of major depression.    Physical Examination  BP (!) 146/85 (BP Location: Right Arm)   Pulse 76   Resp 16   Ht '5\' 2"'$  (1.575 m)   Wt 129 lb (58.5 kg)   BMI 23.59 kg/m  Gen:  WD/WN, NAD.  Appears younger than stated age Head: [place]/AT, No temporalis wasting. Ear/Nose/Throat: Hearing grossly intact, nares w/o erythema or drainage Eyes: Conjunctiva clear. Sclera non-icteric Neck: Supple.  Trachea  midline Pulmonary:  Good air movement, no use of accessory muscles.  Cardiac: RRR, no JVD Vascular:  Vessel Right Left  Radial Palpable Palpable                          PT Palpable Palpable  DP Palpable Palpable   Gastrointestinal: soft, non-tender/non-distended. No guarding/reflex.  Musculoskeletal: M/S 5/5 throughout.  No deformity or atrophy.  Diffuse varicosities bilaterally.  No significant lower extremity edema. Neurologic: Sensation grossly intact in extremities.  Symmetrical.  Speech is fluent.  Psychiatric: Judgment intact, Mood & affect appropriate for pt's clinical situation. Dermatologic: No rashes or ulcers noted.  No cellulitis or open wounds.      Labs No results found for this or any previous visit (from the past 2160 hour(s)).  Radiology No results found.  Assessment/Plan Essential hypertension blood pressure control important in reducing the progression of atherosclerotic disease. On appropriate oral medications.     Combined hyperlipidemia lipid control important in reducing the progression of atherosclerotic disease. Continue statin therapy  Lymphedema Symptom control is quite good with elevation, compression, and activity.  Continue conservative measures.  Raynaud disease Raynaud's symptoms are quite well controlled at this point with cold stimulation avoidance, compression socks, and maintaining normal activity.  Arterial studies today showed no significant arterial insufficiency in either lower extremity with multiphasic waveforms bilaterally and ABIs of approximately 1.1 bilaterally.  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  There is also reflux in the left deep venous system and the great saphenous vein in the thigh but not  at the saphenofemoral junction on the left.  Symptom control is good with compression socks and elevation.  No role for intervention.  Return to clinic in 1 year.    Leotis Pain, MD  03/27/2022 11:16 AM    This note was created with Dragon medical transcription system.  Any errors from dictation are purely unintentional

## 2022-03-27 NOTE — Assessment & Plan Note (Signed)
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  There is also reflux in the left deep venous system and the great saphenous vein in the thigh but not at the saphenofemoral junction on the left.  Symptom control is good with compression socks and elevation.  No role for intervention.  Return to clinic in 1 year.

## 2022-03-27 NOTE — Assessment & Plan Note (Signed)
Raynaud's symptoms are quite well controlled at this point with cold stimulation avoidance, compression socks, and maintaining normal activity.  Arterial studies today showed no significant arterial insufficiency in either lower extremity with multiphasic waveforms bilaterally and ABIs of approximately 1.1 bilaterally.

## 2022-07-12 ENCOUNTER — Other Ambulatory Visit: Payer: Self-pay | Admitting: Internal Medicine

## 2022-07-12 DIAGNOSIS — Z1231 Encounter for screening mammogram for malignant neoplasm of breast: Secondary | ICD-10-CM

## 2022-08-11 ENCOUNTER — Ambulatory Visit: Payer: Self-pay | Admitting: General Surgery

## 2022-08-11 NOTE — H&P (View-Only) (Signed)
PATIENT PROFILE: Whitney Pace is a 76 y.o. female who presents to the Clinic for consultation at the request of Dr. Miller for evaluation of right get hernia.  PCP:  Miller, Mark Frederic, MD  HISTORY OF PRESENT ILLNESS: [name] reports having a hernia on his slight lower abdomen.  She endorses that is changing sides frequently.  Sometimes gets painful when it is out.  She is able to reduce the bulge sometimes.  Pain localized to the right lower abdomen.  No pain radiation.  Pain aggravated by applying pressure.  Patient also aggravated by prolapse of the hernia.  Elevated factor is resting.  Denies any episode of nausea and vomiting.  Denies any previous hernia.  Denies any previous abdominal surgeries.   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 fib 2019      Idiopathic peripheral neuropathy 12/20/2017   Overview    Bottom of both feet, followed by vascular      Primary osteoarthritis of left hip 02/15/2016   Essential hypertension 12/08/2014   PVC (premature ventricular contraction) 09/09/2014   MVP (mitral valve prolapse) 09/09/2014   Overview    MV repair, 2008      Combined hyperlipidemia 09/09/2014   Overview    Normal bilateral lower extremity arteriogram by vascular 2019       GENERAL REVIEW OF SYSTEMS:   General ROS: negative for - chills, fatigue, fever, weight gain or weight loss Allergy and Immunology ROS: negative for - hives  Hematological and Lymphatic ROS: negative for - bleeding problems or bruising, negative for palpable nodes Endocrine ROS: negative for - heat or cold intolerance, hair changes Respiratory ROS: negative for - cough, shortness of breath or  wheezing Cardiovascular ROS: no chest pain or palpitations GI ROS: negative for nausea, vomiting, abdominal pain, diarrhea, constipation Musculoskeletal ROS: negative for - joint swelling or muscle pain Neurological ROS: negative for - confusion, syncope Dermatological ROS: negative for pruritus and rash Psychiatric: negative for anxiety, depression, difficulty sleeping and memory loss  MEDICATIONS: Current Outpatient Medications  Medication Sig Dispense Refill   alpha lipoic acid 200 mg Cap Take by mouth     ascorbic acid, vitamin C, (VITAMIN C) 500 MG tablet Take 500 mg by mouth once daily.       aspirin 81 MG EC tablet Take 81 mg by mouth nightly        brimonidine-timoloL (COMBIGAN) 0.2-0.5 % ophthalmic solution INSTILL ONE DROP INTO BOTH EYES TWICE DAILY 5 mL 11   carboxymethylcellulose (REFRESH CELLUVISC) 1 % ophthalmic dropperette Place 1 drop into both eyes as needed for Dry Eyes.     dorzolamide (TRUSOPT) 2 % ophthalmic solution INSTILL ONE DROP INTO BOTH EYES THREE TIMES DAILY 10 mL 11   galantamine (RAZADYNE) 12 MG tablet Take 1 tablet (12 mg total) by mouth 2 (two) times daily with meals 180 tablet 3   ketorolac (ACULAR) 0.5 % ophthalmic solution Place 1 drop into the left eye 4 (four) times daily 10 mL 6   lovastatin (MEVACOR) 40 MG tablet TAKE 1 TABLET BY MOUTH ONCE DAILY   WITH SUPPER 90 tablet 0   memantine (NAMENDA) 10 MG tablet Take 1 tablet (10 mg total) by mouth 2 (two) times daily 180 tablet 3   metoprolol tartrate (LOPRESSOR) 25 MG tablet Take 1 tablet by mouth twice daily 180 tablet 3   PARoxetine (PAXIL) 30 MG tablet Take 1 tablet (30 mg total) by mouth once daily 90 tablet 3   ALPRAZolam (XANAX) 0.25 MG tablet Take 1 tablet (0.25 mg total) by mouth 2 (two) times daily as needed (Patient not taking: Reported on 08/09/2022) 60 tablet 5   No current facility-administered medications for this visit.    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/09/2014   Glaucoma suspect of right eye    Hypertension    MVP (mitral valve prolapse) 09/09/2014   MV repair, 2008   PVC (premature ventricular contraction) 09/09/2014   Secondary open-angle glaucoma, Left eye     PAST SURGICAL HISTORY: Past Surgical History:  Procedure Laterality Date   HYSTERECTOMY  1996   REPAIR MITRAL VALVE  2007   has to take antibiotics with surgical procedures   LENS EYE SURGERY Left 10-2008   cataract surgery OS and RD found next day   VITREOUS RETINAL SURGERY Left 10-2008   1 day after CE w/IOL OS; RD repair w/ buckle OS   VITREOUS RETINAL SURGERY Left 11-2008   re-detachment repair OS   REPLACEMENT TOTAL HIP W/  RESURFACING IMPLANTS  2015   left   EXTRACTION CATARACT EXTRACAPSULAR W/INSERTION INTRAOCULAR PROSTHESIS Right 08/25/2020   Procedure: EXTRACAPSULAR CATARACT PHACO REMOVAL WITH INSERTION OF INTRAOCULAR LENS PROSTHESIS (1 STAGE PROCEDURE), MANUAL OR MECHANICAL TECHNIQUE;  Surgeon: McKinnon, Stuart James, MD;  [facility];  Service: Ophthalmology;  Laterality: Right;   skin cancer lesions removed     lips and right hand   TOOTH EXTRACTION     VITREOUS RETINAL SURGERY Left ~2011   scar tissue removal OS   VITREOUS RETINAL SURGERY Left    Silicone oil bubble removed OS     FAMILY HISTORY: Family History  Problem Relation Age of Onset   Stroke Father    Coronary Artery Disease (Blocked arteries around heart) Father    Clotting disorder Father    Liver cancer Father    Diabetes Maternal Grandfather    High blood pressure (Hypertension) Mother    No Known Problems Sister    No Known Problems Brother    No Known Problems Maternal Aunt    No Known Problems Maternal Uncle    No Known  Problems Paternal Aunt    No Known Problems Paternal Uncle    No Known Problems Maternal Grandmother    No Known Problems Paternal Grandmother    No Known Problems Paternal Grandfather    Glaucoma Neg Hx    Blindness Neg Hx    Macular degeneration Neg Hx    Vision loss Neg Hx    Thyroid disease Neg Hx    Cataracts Neg Hx    Anesthesia problems Neg Hx      SOCIAL HISTORY: Social History   Socioeconomic History   Marital status: Married  Tobacco Use   Smoking status: Never   Smokeless tobacco: Never  Vaping Use   Vaping Use: Never used  Substance and Sexual Activity     Alcohol use: Not Currently    Comment: Socially/rarely   Drug use: No   Sexual activity: Defer  Social History Narrative   Lives in [place] with spouse.    PHYSICAL EXAM: Vitals:   08/09/22 1408  BP: 135/70  Pulse: 75   Body mass index is 22.67 kg/m. Weight: 58.1 kg (128 lb) (per patient)   GENERAL: Alert, active, oriented x3  HEENT: Pupils equal reactive to light. Extraocular movements are intact. Sclera clear. Palpebral conjunctiva normal red color.Pharynx clear.  NECK: Supple with no palpable mass and no adenopathy.  LUNGS: Sound clear with no rales rhonchi or wheezes.  HEART: Regular rhythm S1 and S2 without murmur.  ABDOMEN: Soft and depressible, nontender with no palpable mass, no hepatomegaly.  Right lower quadrant hernia.  Not quite in the place of inguinal hernia.  Pain to palpation.  EXTREMITIES: Well-developed well-nourished symmetrical with no dependent edema.  NEUROLOGICAL: Awake alert oriented, facial expression symmetrical, moving all extremities.  REVIEW OF DATA: I have reviewed the following data today: Appointment on 07/06/2022  Component Date Value   WBC (White Blood Cell Co* 07/06/2022 4.3    RBC (Red Blood Cell Coun* 07/06/2022 4.53    Hemoglobin 07/06/2022 13.6    Hematocrit 07/06/2022 42.7    MCV (Mean Corpuscular Vo* 07/06/2022 94.3    MCH (Mean Corpuscular He*  07/06/2022 30.0    MCHC (Mean Corpuscular H* 07/06/2022 31.9 (L)    Platelet Count 07/06/2022 205    RDW-CV (Red Cell Distrib* 07/06/2022 12.4    MPV (Mean Platelet Volum* 07/06/2022 11.4    Neutrophils 07/06/2022 1.85    Lymphocytes 07/06/2022 1.90    Monocytes 07/06/2022 0.38    Eosinophils 07/06/2022 0.16    Basophils 07/06/2022 0.05    Neutrophil % 07/06/2022 42.5    Lymphocyte % 07/06/2022 43.8    Monocyte % 07/06/2022 8.8    Eosinophil % 07/06/2022 3.7    Basophil% 07/06/2022 1.2    Immature Granulocyte % 07/06/2022 0.0    Immature Granulocyte Cou* 07/06/2022 0.00    Glucose 07/06/2022 101    Sodium 07/06/2022 141    Potassium 07/06/2022 4.5    Chloride 07/06/2022 106    Carbon Dioxide (CO2) 07/06/2022 31.0    Urea Nitrogen (BUN) 07/06/2022 15    Creatinine 07/06/2022 0.8    Glomerular Filtration Ra* 07/06/2022 69    Calcium 07/06/2022 9.5    AST  07/06/2022 24    ALT  07/06/2022 13    Alk Phos (alkaline Phosp* 07/06/2022 69    Albumin 07/06/2022 4.3    Bilirubin, Total 07/06/2022 0.7    Protein, Total 07/06/2022 6.8    A/G Ratio 07/06/2022 1.7    Cholesterol, Total 07/06/2022 173    Triglyceride 07/06/2022 88    HDL (High Density Lipopr* 07/06/2022 58.6    LDL Calculated 07/06/2022 97    VLDL Cholesterol 07/06/2022 18    Cholesterol/HDL Ratio 07/06/2022 3.0    Thyroid Stimulating Horm* 07/06/2022 0.988      ASSESSMENT: Ms. Mounger is a 76 y.o. female presenting for consultation for right spigelian hernia.    The patient presents with a symptomatic, right spigelian hernia. Patient was oriented about the diagnosis of ventral/spigelian hernia and its implication. The patient was oriented about the treatment alternatives (observation vs surgical repair). Due to patient symptoms, repair is recommended. Patient oriented about the surgical procedure, the use of mesh and its risk of complications such as: infection, bleeding, injury to vas deference, vasculature and testicle,    injury to bowel or bladder, and chronic pain.   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   Patient and and her husband verbalized understanding, all questions were answered, and were agreeable with the plan outlined above.    Larcenia Holaday Cintron-Diaz, MD  Electronically signed by Vanesha Athens Cintron-Diaz, MD  

## 2022-08-11 NOTE — H&P (Signed)
PATIENT PROFILE: Whitney Pace is a 76 y.o. female who presents to the Clinic for consultation at the request of Whitney Pace for evaluation of right get hernia.  PCP:  Whitney Pax, MD  HISTORY OF PRESENT ILLNESS: Whitney Pace reports having a hernia on his slight lower abdomen.  She endorses that is changing sides frequently.  Sometimes gets painful when it is out.  She is able to reduce the bulge sometimes.  Pain localized to the right lower abdomen.  No pain radiation.  Pain aggravated by applying pressure.  Patient also aggravated by prolapse of the hernia.  Elevated factor is resting.  Denies any episode of nausea and vomiting.  Denies any previous hernia.  Denies any previous abdominal surgeries.   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 fib 2019      Idiopathic peripheral neuropathy 12/20/2017   Overview    Bottom of both feet, followed by vascular      Primary osteoarthritis of left hip 02/15/2016   Essential hypertension 12/08/2014   PVC (premature ventricular contraction) 09/09/2014   MVP (mitral valve prolapse) 09/09/2014   Overview    MV repair, 2008      Combined hyperlipidemia 09/09/2014   Overview    Normal bilateral lower extremity arteriogram by vascular 2019       GENERAL REVIEW OF SYSTEMS:   General ROS: negative for - chills, fatigue, fever, weight gain or weight loss Allergy and Immunology ROS: negative for - hives  Hematological and Lymphatic ROS: negative for - bleeding problems or bruising, negative for palpable nodes Endocrine ROS: negative for - heat or cold intolerance, hair changes Respiratory ROS: negative for - cough, shortness of breath or  wheezing Cardiovascular ROS: no chest pain or palpitations GI ROS: negative for nausea, vomiting, abdominal pain, diarrhea, constipation Musculoskeletal ROS: negative for - joint swelling or muscle pain Neurological ROS: negative for - confusion, syncope Dermatological ROS: negative for pruritus and rash Psychiatric: negative for anxiety, depression, difficulty sleeping and memory loss  MEDICATIONS: Current Outpatient Medications  Medication Sig Dispense Refill   alpha lipoic acid 200 mg Cap Take by mouth     ascorbic acid, vitamin C, (VITAMIN C) 500 MG tablet Take 500 mg by mouth once daily.       aspirin 81 MG EC tablet Take 81 mg by mouth nightly        brimonidine-timoloL (COMBIGAN) 0.2-0.5 % ophthalmic solution INSTILL ONE DROP INTO BOTH EYES TWICE DAILY 5 mL 11   carboxymethylcellulose (REFRESH CELLUVISC) 1 % ophthalmic dropperette Place 1 drop into both eyes as needed for Dry Eyes.     dorzolamide (TRUSOPT) 2 % ophthalmic solution INSTILL ONE DROP INTO BOTH EYES THREE TIMES DAILY 10 mL 11   galantamine (RAZADYNE) 12 MG tablet Take 1 tablet (12 mg total) by mouth 2 (two) times daily with meals 180 tablet 3   ketorolac (ACULAR) 0.5 % ophthalmic solution Place 1 drop into the left eye 4 (four) times daily 10 mL 6   lovastatin (MEVACOR) 40 MG tablet TAKE 1 TABLET BY MOUTH ONCE DAILY  WITH SUPPER 90 tablet 0   memantine (NAMENDA) 10 MG tablet Take 1 tablet (10 mg total) by mouth 2 (two) times daily 180 tablet 3   metoprolol tartrate (LOPRESSOR) 25 MG tablet Take 1 tablet by mouth twice daily 180 tablet 3   PARoxetine (PAXIL) 30 MG tablet Take 1 tablet (30 mg total) by mouth once daily 90 tablet 3   ALPRAZolam (XANAX) 0.25 MG tablet Take 1 tablet (0.25 mg total) by mouth 2 (two) times daily as needed (Patient not taking: Reported on 08/09/2022) 60 tablet 5   No current facility-administered medications for this visit.    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/09/2014   Glaucoma suspect of right eye    Hypertension    MVP (mitral valve prolapse) 09/09/2014   MV repair, 2008   PVC (premature ventricular contraction) 09/09/2014   Secondary open-angle glaucoma, Left eye     PAST SURGICAL HISTORY: Past Surgical History:  Procedure Laterality Date   [place] MITRAL VALVE  2007   has to take antibiotics with surgical procedures   LENS EYE SURGERY Left 10-2008   cataract surgery OS and RD found next day   VITREOUS RETINAL SURGERY Left 10-2008   1 day after CE w/IOL OS; RD repair w/ buckle OS   VITREOUS RETINAL SURGERY Left 11-2008   re-detachment repair OS   REPLACEMENT TOTAL HIP W/  RESURFACING IMPLANTS  2015   left   EXTRACTION CATARACT EXTRACAPSULAR W/INSERTION INTRAOCULAR PROSTHESIS Right 08/25/2020   Procedure: EXTRACAPSULAR CATARACT PHACO REMOVAL WITH INSERTION OF INTRAOCULAR LENS PROSTHESIS (1 STAGE PROCEDURE), MANUAL OR MECHANICAL TECHNIQUE;  Surgeon: Orvis Brill, MD;  Location: [facility];  Service: Ophthalmology;  Laterality: Right;   skin cancer lesions removed     lips and right hand   TOOTH EXTRACTION     VITREOUS RETINAL SURGERY Left ~2011   scar tissue removal OS   VITREOUS RETINAL SURGERY Left    Silicone oil bubble removed OS     FAMILY HISTORY: Family History  Problem Relation Age of Onset   Stroke Father    Coronary Artery Disease (Blocked arteries around heart) Father    Clotting disorder Father    Liver cancer Father    Diabetes Maternal Grandfather    High blood pressure (Hypertension) Mother    No Known Problems Sister    No Known Problems Brother    No Known Problems Maternal Aunt    No Known Problems Maternal Uncle    No Known  Problems Paternal Aunt    No Known Problems Paternal Uncle    No Known Problems Maternal Grandmother    No Known Problems Paternal Grandmother    No Known Problems Paternal Grandfather    Glaucoma Neg Hx    Blindness Neg Hx    Macular degeneration Neg Hx    Vision loss Neg Hx    Thyroid disease Neg Hx    Cataracts Neg Hx    Anesthesia problems Neg Hx      SOCIAL HISTORY: Social History   Socioeconomic History   Marital status: Married  Tobacco Use   Smoking status: Never   Smokeless tobacco: Never  Vaping Use   Vaping Use: Never used  Substance and Sexual Activity  Alcohol use: Not Currently    Comment: Socially/rarely   Drug use: No   Sexual activity: Defer  Social History Narrative   Lives in [place] with spouse.    PHYSICAL EXAM: Vitals:   08/09/22 1408  BP: 135/70  Pulse: 75   Body mass index is 22.67 kg/m. Weight: 58.1 kg (128 lb) (per patient)   GENERAL: Alert, active, oriented x3  HEENT: Pupils equal reactive to light. Extraocular movements are intact. Sclera clear. Palpebral conjunctiva normal red color.Pharynx clear.  NECK: Supple with no palpable mass and no adenopathy.  LUNGS: Sound clear with no rales rhonchi or wheezes.  HEART: Regular rhythm S1 and S2 without murmur.  ABDOMEN: Soft and depressible, nontender with no palpable mass, no hepatomegaly.  Right lower quadrant hernia.  Not quite in the place of inguinal hernia.  Pain to palpation.  EXTREMITIES: Well-developed well-nourished symmetrical with no dependent edema.  NEUROLOGICAL: Awake alert oriented, facial expression symmetrical, moving all extremities.  REVIEW OF DATA: I have reviewed the following data today: Appointment on 07/06/2022  Component Date Value   WBC (White Blood Cell Co* 07/06/2022 4.3    RBC (Red Blood Cell Coun* 07/06/2022 4.53    Hemoglobin 07/06/2022 13.6    Hematocrit 07/06/2022 42.7    MCV (Mean Corpuscular Vo* 07/06/2022 94.3    MCH (Mean Corpuscular He*  07/06/2022 30.0    MCHC (Mean Corpuscular H* 07/06/2022 31.9 (L)    Platelet Count 07/06/2022 205    RDW-CV (Red Cell Distrib* 07/06/2022 12.4    MPV (Mean Platelet Volum* 07/06/2022 11.4    Neutrophils 07/06/2022 1.85    Lymphocytes 07/06/2022 1.90    Monocytes 07/06/2022 0.38    Eosinophils 07/06/2022 0.16    Basophils 07/06/2022 0.05    Neutrophil % 07/06/2022 42.5    Lymphocyte % 07/06/2022 43.8    Monocyte % 07/06/2022 8.8    Eosinophil % 07/06/2022 3.7    Basophil% 07/06/2022 1.2    Immature Granulocyte % 07/06/2022 0.0    Immature Granulocyte Cou* 07/06/2022 0.00    Glucose 07/06/2022 101    Sodium 07/06/2022 141    Potassium 07/06/2022 4.5    Chloride 07/06/2022 106    Carbon Dioxide (CO2) 07/06/2022 31.0    Urea Nitrogen (BUN) 07/06/2022 15    Creatinine 07/06/2022 0.8    Glomerular Filtration Ra* 07/06/2022 69    Calcium 07/06/2022 9.5    AST  07/06/2022 24    ALT  07/06/2022 13    Alk Phos (alkaline Phosp* 07/06/2022 69    Albumin 07/06/2022 4.3    Bilirubin, Total 07/06/2022 0.7    Protein, Total 07/06/2022 6.8    A/G Ratio 07/06/2022 1.7    Cholesterol, Total 07/06/2022 173    Triglyceride 07/06/2022 88    HDL (High Density Lipopr* 07/06/2022 58.6    LDL Calculated 07/06/2022 97    VLDL Cholesterol 07/06/2022 18    Cholesterol/HDL Ratio 07/06/2022 3.0    Thyroid Stimulating Horm* 07/06/2022 0.988      ASSESSMENT: Ms. Shan is a 76 y.o. female presenting for consultation for right spigelian hernia.    The patient presents with a symptomatic, right spigelian hernia. Patient was oriented about the diagnosis of ventral/spigelian hernia and its implication. The patient was oriented about the treatment alternatives (observation vs surgical repair). Due to patient symptoms, repair is recommended. Patient oriented about the surgical procedure, the use of mesh and its risk of complications such as: infection, bleeding, injury to vas deference, vasculature and testicle,  injury to bowel or bladder, and chronic pain.   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   Patient and and her husband verbalized understanding, all questions were answered, and were agreeable with the plan outlined above.    Herbert Pun, MD  Electronically signed by Herbert Pun, MD

## 2022-08-13 ENCOUNTER — Encounter
Admission: RE | Admit: 2022-08-13 | Discharge: 2022-08-13 | Disposition: A | Discharge status: Home / Self Care | Payer: Medicare Other | Source: Ambulatory Visit | Attending: General Surgery | Admitting: General Surgery

## 2022-08-13 HISTORY — DX: Hyperlipidemia, unspecified: E78.5

## 2022-08-13 NOTE — Patient Instructions (Addendum)
Your procedure is scheduled on: Wednesday, October 11 Report to the Registration Desk on the 1st floor of the [facility]. To find out your arrival time, please call [phone] between 1PM - 3PM on: Tuesday, October 10 If your arrival time is 6:00 am, do not arrive prior to that time as the [facility] entrance doors do not open until 6:00 am.  REMEMBER: Instructions that are not followed completely may result in serious medical risk, up to and including death; or upon the discretion of your surgeon and anesthesiologist your surgery may need to be rescheduled.  Do not eat or drink after midnight the night before surgery.  No gum chewing, lozengers or hard candies.  TAKE THESE MEDICATIONS THE MORNING OF SURGERY WITH A SIP OF WATER:  Galantamine (Razadyne) Memantine (Namenda) Metoprolol 4. Dorzolamide and ketorolac eye drops  One week prior to surgery: starting today, October 9 Stop aspirin and Anti-inflammatories (NSAIDS) such as Advil, Aleve, Ibuprofen, Motrin, Naproxen, Naprosyn and Aspirin based products such as Excedrin, Goodys Powder, BC Powder. Stop ANY OVER THE COUNTER supplements until after surgery. Stop multiple vitamin and vitamin C. You may however, continue to take Tylenol if needed for pain up until the day of surgery.  No Alcohol for 24 hours before or after surgery.  No Smoking including e-cigarettes for 24 hours prior to surgery.  No chewable tobacco products for at least 6 hours prior to surgery.  No nicotine patches on the day of surgery.  Do not use any "recreational" drugs for at least a week prior to your surgery.  Please be advised that the combination of cocaine and anesthesia may have negative outcomes, up to and including death. If you test positive for cocaine, your surgery will be cancelled.  On the morning of surgery brush your teeth with toothpaste and water, you may rinse your mouth with mouthwash if you wish. Do not swallow any toothpaste or  mouthwash.  Use CHG Soap as directed on instruction sheet.  Do not wear jewelry, make-up, hairpins, clips or nail polish.  Do not wear lotions, powders, or perfumes.   Do not shave body from the neck down 48 hours prior to surgery just in case you cut yourself which could leave a site for infection.  Also, freshly shaved skin may become irritated if using the CHG soap.  Contact lenses, hearing aids and dentures may not be worn into surgery.  Do not bring valuables to the hospital. [facility] is not responsible for any missing/lost belongings or valuables.   Notify your doctor if there is any change in your medical condition (cold, fever, infection).  Wear comfortable clothing (specific to your surgery type) to the hospital.  After surgery, you can help prevent lung complications by doing breathing exercises.  Take deep breaths and cough every 1-2 hours. Your doctor may order a device called an Incentive Spirometer to help you take deep breaths. When coughing or sneezing, hold a pillow firmly against your incision with both hands. This is called "splinting." Doing this helps protect your incision. It also decreases belly discomfort.  If you are being discharged the day of surgery, you will not be allowed to drive home. You will need a responsible adult (18 years or older) to drive you home and stay with you that night.   If you are taking public transportation, you will need to have a responsible adult (18 years or older) with you. Please confirm with your physician that it is acceptable to use  public transportation.   Please call the [place] Dept. at [phone] if you have any questions about these instructions.  Surgery Visitation Policy:  Patients undergoing a surgery or procedure may have two family members or support persons with them as long as the person is not COVID-19 positive or experiencing its symptoms.      Preparing for Surgery with CHLORHEXIDINE  GLUCONATE (CHG) Soap  Chlorhexidine Gluconate (CHG) Soap  o An antiseptic cleaner that kills germs and bonds with the skin to continue killing germs even after washing  o Used for showering the night before surgery and morning of surgery  Before surgery, you can play an important role by reducing the number of germs on your skin.  CHG (Chlorhexidine gluconate) soap is an antiseptic cleanser which kills germs and bonds with the skin to continue killing germs even after washing.  Please do not use if you have an allergy to CHG or antibacterial soaps. If your skin becomes reddened/irritated stop using the CHG.  1. Shower the NIGHT BEFORE SURGERY and the MORNING OF SURGERY with CHG soap.  2. If you choose to wash your hair, wash your hair first as usual with your normal shampoo.  3. After shampooing, rinse your hair and body thoroughly to remove the shampoo.  4. Use CHG as you would any other liquid soap. You can apply CHG directly to the skin and wash gently with a scrungie or a clean washcloth.  5. Apply the CHG soap to your body only from the neck down. Do not use on open wounds or open sores. Avoid contact with your eyes, ears, mouth, and genitals (private parts). Wash face and genitals (private parts) with your normal soap.  6. Wash thoroughly, paying special attention to the area where your surgery will be performed.  7. Thoroughly rinse your body with warm water.  8. Do not shower/wash with your normal soap after using and rinsing off the CHG soap.  9. Pat yourself dry with a clean towel.  10. Wear clean pajamas to bed the night before surgery.  12. Place clean sheets on your bed the night of your first shower and do not sleep with pets.  13. Shower again with the CHG soap on the day of surgery prior to arriving at the hospital.  14. Do not apply any deodorants/lotions/powders.  15. Please wear clean clothes to the hospital.

## 2022-08-14 ENCOUNTER — Encounter: Payer: Self-pay | Admitting: General Surgery

## 2022-08-14 NOTE — Progress Notes (Signed)
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 Clinical nursing staff has updated patient's PMH/PSHx, current medication list, and drug allergies/intolerances to ensure comprehensive history available to assist in medical decision making as it pertains to the aforementioned surgical procedure and anticipated anesthetic course. Extensive review of available clinical information performed. [place] PMH and PSHx updated with any diagnoses/procedures that  may have been inadvertently omitted during her intake with the pre-admission testing department's nursing staff.  Clinical Discussion:  Whitney Pace is a 76 y.o. female who is submitted for pre-surgical anesthesia review and clearance prior to her undergoing the above procedure. Patient has never been a smoker. Pertinent PMH includes: MVP (s/p MVR), cardiac murmur, diastolic dysfunction, HTN, HLD, GERD (no daily Tx), lower extremity lymphedema, imbalance, memory loss, glaucoma, OA, depression, anxiety (on BZO).   Patient is followed by cardiology Saralyn Pilar, MD). She was last seen in the cardiology clinic on 06/13/2022; notes reviewed.  At the time of her clinic visit, patient doing well overall from a cardiovascular perspective.  She denied any episodes of chest pain, shortness of breath, PND, orthopnea, palpitations, significant peripheral edema, vertiginous symptoms, or presyncope/syncope.  Patient with a past medical history significant for  cardiovascular diagnoses.  Diagnostic LEFT heart catheterization was performed on 12/16/2006 revealing no evidence of significant obstructive coronary artery disease.  There was significant mitral valve prolapse noted.  Patient has a history of significant mitral valve prolapse with associated valvular regurgitation.  She underwent RIGHT thoracotomy and ring valvuloplasty on 12/17/2006 placing a 40 mm Seguin ring.  Most TTE was performed on 06/04/2018 revealing a low normal left ventricular systolic function with an EF of 50%.  There were no regional wall motion abnormalities. Left ventricular diastolic Doppler parameters consistent with pseudonormalization (G2DD).  Right atrium mildly enlarged.  There was trivial to mild pan valvular regurgitation.  There was no evidence of a significant transvalvular gradient to suggest stenosis.  Patient has been hospitalized and worked up in the past for possible atrial fibrillation.  Long-term cardiac event monitor studies have revealed predominant underlying sinus rhythms with frequent PVCs and occasional PACs.  Occasional brief atrial runs have been noted, however there was no definitive evidence for atrial fibrillation.  Given her atrial and ventricular ectopy, patient is on beta-blockade therapy (metoprolol tartrate) as a preventative.  With no actual documented episodes of atrial fibrillation, the decision was made to defer chronic anticoagulation therapy in this patient.  Blood pressure well controlled at 122/80 mmHg on currently prescribed beta-blocker (metoprolol tartrate) monotherapy.  Patient is on lovastatin for her HLD diagnosis and ASCVD prevention.  She is not diabetic. Patient does not have an OSAH diagnosis.  Patient maintains a very active lifestyle.  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  Patient is able to achieve >4 METS of physical activity without experiencing any degree of angina/anginal  equivalent symptoms.  No changes were made to her medication regimen.  Patient to follow-up with outpatient cardiology in 4 months or  sooner if needed.  Whitney Pace is scheduled for an elective XI ROBOTIC ASSISTED VENTRAL HERNIA REPAIR on 08/15/2022 with Dr. Herbert Pun, MD. Given patient's past medical history significant for cardiovascular diagnoses, presurgical cardiac clearance was sought by the PAT team. Per cardiology, "this patient is optimized for surgery and may proceed with the planned procedural course with a LOW risk of significant perioperative cardiovascular complications".  In review of her medication reconciliation, it is noted that patient is currently on prescribed daily antiplatelet therapy. She has been instructed on recommendations for holding her daily low-dose ASA for 5 days prior to her procedure with plans to restart as soon as postoperative bleeding risk felt to be minimized by her attending surgeon. The patient has been instructed that her last dose of her ASA should be on 08/09/2022.  Patient denies previous perioperative complications with anesthesia in the past. In review of the available records, it is noted that patient underwent a MAC anesthetic course at [facility] (ASA III) in 08/2020 without documented complications.      08/13/2022    2:08 PM 03/27/2022   10:29 AM 01/02/2022   10:33 AM  Vitals with BMI  Height _0  _1  _2   Weight 128 lbs 129 lbs 136 lbs  BMI 22.68 85.88 50.27  Systolic  741 287  Diastolic  85 83  Pulse  76 80    Providers/Specialists:   NOTE: Primary physician provider listed below. Patient may have been seen by APP or partner within same practice.   PROVIDER ROLE / SPECIALTY LAST Tanna Savoy, MD General Surgery (Surgeon) 08/09/2022  Rusty Aus, MD Primary Care Provider 07/12/2022  Isaias Cowman, MD Cardiology 06/13/2022  Gurney Maxin, MD Neurology 07/25/2022   Allergies:   Atropine, Escitalopram oxalate, Neomycin-bacitracin zn-polymyx, Promethazine, Rivastigmine, Rivastigmine tartrate, Tramadol, and Venlafaxine  Current Home Medications:   No current facility-administered medications for this encounter.    Alpha-Lipoic Acid 200 MG CAPS   ALPRAZolam (XANAX) 0.25 MG tablet   aspirin EC 81 MG tablet   brimonidine-timolol (COMBIGAN) 0.2-0.5 % ophthalmic solution   dorzolamide (TRUSOPT) 2 % ophthalmic solution   galantamine (RAZADYNE) 12 MG tablet   ketorolac (ACULAR) 0.4 % SOLN   lovastatin (MEVACOR) 40 MG tablet   memantine (NAMENDA) 10 MG tablet   metoprolol tartrate (LOPRESSOR) 25 MG tablet   Multiple Vitamin (MULTIVITAMIN WITH MINERALS) TABS tablet   PARoxetine (PAXIL) 30 MG tablet   vitamin C (ASCORBIC ACID) 500 MG tablet   History:   Past Medical History:  Diagnosis Date   Anxiety    a.) on BZO (alprazolam) PRN   Arthritis    Childhood asthma    Diastolic dysfunction    a.) TTE 06/04/2018: EF 50%, mild RAE, trivial AR/PR, mild MR/TR, G2DD   GERD (gastroesophageal reflux disease)    Glaucoma    Heart murmur    History of cardiac cath 12/16/2006   a.) LHC 12/16/2006: norm coronaries with no obstructive CAD; definite MVP   Hyperlipidemia    Hypertension    Imbalance    Lymphedema of left lower extremity    Major depression    Memory loss    a.) on memantine   MVP (mitral valve prolapse)    a.) s/p ring valvuloplasty at [facility] 12/17/2006   Numbness in feet    PVC (premature ventricular contraction)    Raynaud disease    S/P MVR (mitral valve replacement) 12/17/2006   a.) ring valvuloplasty via RIGHT  thoracotomy approach; 40 mm Seguin ring   Skin cancer    Spigelian hernia    Uterine cancer ([place])    Past Surgical History:  Procedure Laterality Date   BREAST CYST ASPIRATION Bilateral    negative   CATARACT EXTRACTION W/ INTRAOCULAR LENS IMPLANT Bilateral    EYE SURGERY Left    repair detatched retina x 2, removal of "bubble' AND  SCAR TISSUE IN EYE   EYE SURGERY Left    laser treatment left eye x 3   LEFT HEART CATH AND CORONARY ANGIOGRAPHY Left 12/16/2006   Procedure: LEFT HEART CATH AND CORONARY ANGIOGRAPHY; Location: [facility]; Surgeon: Joyice Faster, MD   MITRAL VALVE REPAIR Right 12/17/2006   Procedure: RIGHT THORACOTOMY AND RING VALVULOPLASTY (MITRAL); Location: [facility]   TOTAL ABDOMINAL HYSTERECTOMY W/ BILATERAL SALPINGOOPHORECTOMY     TOTAL HIP ARTHROPLASTY Left 10/15/2016   Procedure: LEFT TOTAL HIP ARTHROPLASTY ANTERIOR APPROACH;  Surgeon: Paralee Cancel, MD;  Location: [facility] ORS;  Service: Orthopedics;  Laterality: Left;   Family History  Problem Relation Age of Onset   Breast cancer Paternal Aunt        two aunts in their 50's   Social History   Tobacco Use   Smoking status: Never   Smokeless tobacco: Never  Vaping Use   Vaping Use: Never used  Substance Use Topics   Alcohol use: No    Comment: wine rarely   Drug use: No    Pertinent Clinical Results:  LABS: Labs reviewed: Acceptable for surgery.       ECG: Date: 01/22/2022 Rate: 54 bpm Rhythm:  Sinus bradycardia with PACs in a pattern of bigeminy Axis (leads I and aVF): Normal Intervals: PR 166 ms. QRS 108 ms. QTc 430 ms. ST segment and T wave changes: Nonspecific ST and T wave abnormality. Comparison: Similar to previous tracing obtained on 09/24/2019.  PACs now present NOTE: Tracing obtained at [facility]; unable for review. Above based on cardiologist's interpretation.    IMAGING / PROCEDURES: MR BRAIN WO CONTRAST performed on 01/23/2022 No evidence of acute intracranial abnormality. Minimal chronic small-vessel ischemic changes within the cerebral white matter, slightly progressed from the prior brain MRI of 07/17/2011.  TRANSTHORACIC ECHOCARDIOGRAM performed on 06/04/2018 Low normal left ventricular systolic function with an EF of 50% No regional wall motion abnormalities Left ventricular diastolic Doppler parameters consistent  with pseudonormalization (G2DD). Right atrium mildly enlarged Trivial AR and PR Mild MR and TR No pericardial effusion  Impression and Plan:  Whitney Pace has been referred for pre-anesthesia review and clearance prior to her undergoing the planned anesthetic and procedural courses. Available labs, pertinent testing, and imaging results were personally reviewed by me. This patient has been appropriately cleared by cardiology with an overall LOW risk of significant perioperative cardiovascular complications.  Based on clinical review performed today (08/14/22), barring any significant acute changes in the patient's overall condition, it is anticipated that she will be able to proceed with the planned surgical intervention. Any acute changes in clinical condition may necessitate her procedure being postponed and/or cancelled. Patient will meet with anesthesia team (MD and/or CRNA) on the day of her procedure for preoperative evaluation/assessment. Questions regarding anesthetic course will be fielded at that time.   Pre-surgical instructions were reviewed with the patient during her PAT appointment and questions were fielded by PAT clinical staff. Patient was advised that if any questions or concerns arise prior to her procedure then she should return a call to PAT and/or her surgeon's office to  discuss.  Honor Loh, MSN, APRN, FNP-C, CEN [facility]  Peri-operative Services Nurse Practitioner Phone: [phone] Fax: [phone] 08/14/22 12:26 PM  NOTE: This note has been prepared using Dragon dictation software. Despite my best ability to proofread, there is always the potential that unintentional transcriptional errors may still occur from this process.

## 2022-08-15 ENCOUNTER — Other Ambulatory Visit: Payer: Self-pay

## 2022-08-15 ENCOUNTER — Ambulatory Visit
Admission: RE | Admit: 2022-08-15 | Discharge: 2022-08-15 | Disposition: A | Discharge status: Home / Self Care | Payer: Medicare Other | Source: Ambulatory Visit | Attending: General Surgery | Admitting: General Surgery

## 2022-08-15 ENCOUNTER — Ambulatory Visit: Payer: Medicare Other | Admitting: Urgent Care

## 2022-08-15 ENCOUNTER — Encounter
Admission: RE | Disposition: A | Discharge status: Home / Self Care | Payer: Self-pay | Source: Ambulatory Visit | Attending: General Surgery

## 2022-08-15 DIAGNOSIS — K439 Ventral hernia without obstruction or gangrene: Secondary | ICD-10-CM | POA: Diagnosis present

## 2022-08-15 HISTORY — DX: Raynaud's syndrome without gangrene: I73.00

## 2022-08-15 HISTORY — DX: Unspecified asthma, uncomplicated: J45.909

## 2022-08-15 HISTORY — DX: Lymphedema, not elsewhere classified: I89.0

## 2022-08-15 HISTORY — DX: Major depressive disorder, single episode, unspecified: F32.9

## 2022-08-15 HISTORY — DX: Anesthesia of skin: R20.0

## 2022-08-15 HISTORY — DX: Ventral hernia without obstruction or gangrene: K43.9

## 2022-08-15 HISTORY — DX: Other ill-defined heart diseases: I51.89

## 2022-08-15 HISTORY — DX: Other amnesia: R41.3

## 2022-08-15 HISTORY — DX: Malignant neoplasm of uterus, part unspecified: C55

## 2022-08-15 HISTORY — DX: Ventricular premature depolarization: I49.3

## 2022-08-15 HISTORY — PX: XI ROBOTIC ASSISTED VENTRAL HERNIA: SHX6789

## 2022-08-15 HISTORY — DX: Unspecified malignant neoplasm of skin, unspecified: C44.90

## 2022-08-15 HISTORY — DX: Other abnormalities of gait and mobility: R26.89

## 2022-08-15 SURGERY — REPAIR, HERNIA, VENTRAL, ROBOT-ASSISTED
Anesthesia: General | Site: Abdomen

## 2022-08-15 MED ORDER — ONDANSETRON HCL 4 MG/2ML IJ SOLN: 4.0000 mg | Freq: Once | INTRAMUSCULAR | Status: DC | PRN

## 2022-08-15 MED ORDER — OXYCODONE HCL 5 MG PO TABS: 5.0000 mg | ORAL_TABLET | Freq: Once | ORAL | Status: AC | PRN

## 2022-08-15 MED ORDER — METOPROLOL TARTRATE 5 MG/5ML IV SOLN
INTRAVENOUS | Status: DC | PRN
  Administered 2022-08-15: 1 mg via INTRAVENOUS

## 2022-08-15 MED ORDER — FENTANYL CITRATE (PF) 100 MCG/2ML IJ SOLN: 25.0000 ug | INTRAMUSCULAR | Status: DC | PRN

## 2022-08-15 MED ORDER — BUPIVACAINE-EPINEPHRINE (PF) 0.25% -1:200000 IJ SOLN
INTRAMUSCULAR | Status: AC
  Filled 2022-08-15: qty 30

## 2022-08-15 MED ORDER — OXYCODONE HCL 5 MG PO TABS
ORAL_TABLET | ORAL | Status: AC
  Filled 2022-08-15: qty 1

## 2022-08-15 MED ORDER — BUPIVACAINE-EPINEPHRINE 0.25% -1:200000 IJ SOLN
INTRAMUSCULAR | Status: DC | PRN
  Administered 2022-08-15: 50 mL

## 2022-08-15 MED ORDER — KETOROLAC TROMETHAMINE 30 MG/ML IJ SOLN
INTRAMUSCULAR | Status: DC | PRN
  Administered 2022-08-15: 30 mg via INTRAVENOUS

## 2022-08-15 MED ORDER — GLYCOPYRROLATE 0.2 MG/ML IJ SOLN
INTRAMUSCULAR | Status: DC | PRN
  Administered 2022-08-15: .2 mg via INTRAVENOUS

## 2022-08-15 MED ORDER — ACETAMINOPHEN 10 MG/ML IV SOLN
1000.0000 mg | Freq: Once | INTRAVENOUS | Status: DC | PRN
  Administered 2022-08-15: 1000 mg via INTRAVENOUS

## 2022-08-15 MED ORDER — SUGAMMADEX SODIUM 200 MG/2ML IV SOLN
INTRAVENOUS | Status: DC | PRN
  Administered 2022-08-15: 200 mg via INTRAVENOUS

## 2022-08-15 MED ORDER — CHLORHEXIDINE GLUCONATE 0.12 % MT SOLN
OROMUCOSAL | Status: AC
  Filled 2022-08-15: qty 15

## 2022-08-15 MED ORDER — OXYCODONE HCL 5 MG/5ML PO SOLN
5.0000 mg | Freq: Once | ORAL | Status: AC | PRN
  Administered 2022-08-15: 5 mg via ORAL

## 2022-08-15 MED ORDER — PROPOFOL 10 MG/ML IV BOLUS
INTRAVENOUS | Status: DC | PRN
  Administered 2022-08-15: 100 mg via INTRAVENOUS

## 2022-08-15 MED ORDER — CHLORHEXIDINE GLUCONATE 0.12 % MT SOLN
15.0000 mL | Freq: Once | OROMUCOSAL | Status: AC
  Administered 2022-08-15: 15 mL via OROMUCOSAL

## 2022-08-15 MED ORDER — LIDOCAINE HCL (CARDIAC) PF 100 MG/5ML IV SOSY
PREFILLED_SYRINGE | INTRAVENOUS | Status: DC | PRN
  Administered 2022-08-15: 50 mg via INTRAVENOUS

## 2022-08-15 MED ORDER — FAMOTIDINE 20 MG PO TABS
20.0000 mg | ORAL_TABLET | Freq: Once | ORAL | Status: AC
  Administered 2022-08-15: 20 mg via ORAL

## 2022-08-15 MED ORDER — FENTANYL CITRATE (PF) 100 MCG/2ML IJ SOLN
INTRAMUSCULAR | Status: DC | PRN
  Administered 2022-08-15 (×2): 50 ug via INTRAVENOUS

## 2022-08-15 MED ORDER — CEFAZOLIN SODIUM-DEXTROSE 2-4 GM/100ML-% IV SOLN
INTRAVENOUS | Status: AC
  Filled 2022-08-15: qty 100

## 2022-08-15 MED ORDER — FENTANYL CITRATE (PF) 100 MCG/2ML IJ SOLN
INTRAMUSCULAR | Status: AC
  Filled 2022-08-15: qty 2

## 2022-08-15 MED ORDER — ONDANSETRON HCL 4 MG/2ML IJ SOLN
INTRAMUSCULAR | Status: DC | PRN
  Administered 2022-08-15: 4 mg via INTRAVENOUS

## 2022-08-15 MED ORDER — 0.9 % SODIUM CHLORIDE (POUR BTL) OPTIME
TOPICAL | Status: DC | PRN
  Administered 2022-08-15: 500 mL

## 2022-08-15 MED ORDER — HYDROCODONE-ACETAMINOPHEN 5-325 MG PO TABS: 1.0000 | ORAL_TABLET | ORAL | 0 refills | Status: AC | PRN

## 2022-08-15 MED ORDER — ORAL CARE MOUTH RINSE: 15.0000 mL | Freq: Once | OROMUCOSAL | Status: AC

## 2022-08-15 MED ORDER — LACTATED RINGERS IV SOLN: INTRAVENOUS | Status: DC | PRN

## 2022-08-15 MED ORDER — LACTATED RINGERS IV SOLN: INTRAVENOUS | Status: DC

## 2022-08-15 MED ORDER — ACETAMINOPHEN 10 MG/ML IV SOLN
INTRAVENOUS | Status: AC
  Filled 2022-08-15: qty 100

## 2022-08-15 MED ORDER — ROCURONIUM BROMIDE 100 MG/10ML IV SOLN
INTRAVENOUS | Status: DC | PRN
  Administered 2022-08-15: 30 mg via INTRAVENOUS
  Administered 2022-08-15: 50 mg via INTRAVENOUS

## 2022-08-15 MED ORDER — LABETALOL HCL 5 MG/ML IV SOLN
INTRAVENOUS | Status: DC | PRN
  Administered 2022-08-15: 5 mg via INTRAVENOUS

## 2022-08-15 MED ORDER — CEFAZOLIN SODIUM-DEXTROSE 2-4 GM/100ML-% IV SOLN
2.0000 g | INTRAVENOUS | Status: AC
  Administered 2022-08-15: 2 g via INTRAVENOUS

## 2022-08-15 MED ORDER — DEXAMETHASONE SODIUM PHOSPHATE 10 MG/ML IJ SOLN
INTRAMUSCULAR | Status: DC | PRN
  Administered 2022-08-15: 10 mg via INTRAVENOUS

## 2022-08-15 SURGICAL SUPPLY — 44 items
ADH SKN CLS APL DERMABOND .7 (GAUZE/BANDAGES/DRESSINGS) ×1
BAG PRESSURE INF REUSE 1000 (BAG) IMPLANT
COVER TIP SHEARS 8 DVNC (MISCELLANEOUS) ×1 IMPLANT
COVER TIP SHEARS 8MM DA VINCI (MISCELLANEOUS) ×1
COVER WAND RF STERILE (DRAPES) ×1 IMPLANT
DERMABOND ADVANCED .7 DNX12 (GAUZE/BANDAGES/DRESSINGS) ×1 IMPLANT
DRAPE ARM DVNC X/XI (DISPOSABLE) ×3 IMPLANT
DRAPE COLUMN DVNC XI (DISPOSABLE) ×1 IMPLANT
DRAPE DA VINCI XI ARM (DISPOSABLE) ×3
DRAPE DA VINCI XI COLUMN (DISPOSABLE) ×1
ELECT REM PT RETURN 9FT ADLT (ELECTROSURGICAL) ×1
ELECTRODE REM PT RTRN 9FT ADLT (ELECTROSURGICAL) ×1 IMPLANT
GLOVE BIO SURGEON STRL SZ 6.5 (GLOVE) ×2 IMPLANT
GLOVE BIOGEL PI IND STRL 6.5 (GLOVE) ×2 IMPLANT
GOWN STRL REUS W/ TWL LRG LVL3 (GOWN DISPOSABLE) ×3 IMPLANT
GOWN STRL REUS W/TWL LRG LVL3 (GOWN DISPOSABLE) ×3
IRRIGATOR SUCT 8 DISP DVNC XI (IRRIGATION / IRRIGATOR) IMPLANT
IRRIGATOR SUCTION 8MM XI DISP (IRRIGATION / IRRIGATOR)
IV CATH ANGIO 14GX3.25 ORG (MISCELLANEOUS) IMPLANT
IV NS 1000ML (IV SOLUTION)
IV NS 1000ML BAXH (IV SOLUTION) IMPLANT
KIT PINK PAD W/HEAD ARE REST (MISCELLANEOUS) ×1
KIT PINK PAD W/HEAD ARM REST (MISCELLANEOUS) ×1 IMPLANT
LABEL OR SOLS (LABEL) ×1 IMPLANT
MANIFOLD NEPTUNE II (INSTRUMENTS) ×1 IMPLANT
MESH PROGRIP HERNIA FLAT 15X15 (Mesh General) IMPLANT
NDL INSUFFLATION 14GA 120MM (NEEDLE) ×1 IMPLANT
NEEDLE HYPO 22GX1.5 SAFETY (NEEDLE) ×1 IMPLANT
NEEDLE INSUFFLATION 14GA 120MM (NEEDLE) ×1 IMPLANT
OBTURATOR OPTICAL STANDARD 8MM (TROCAR) ×1
OBTURATOR OPTICAL STND 8 DVNC (TROCAR) ×1
OBTURATOR OPTICALSTD 8 DVNC (TROCAR) ×1 IMPLANT
PACK LAP CHOLECYSTECTOMY (MISCELLANEOUS) ×1 IMPLANT
SEAL CANN UNIV 5-8 DVNC XI (MISCELLANEOUS) ×3 IMPLANT
SEAL XI 5MM-8MM UNIVERSAL (MISCELLANEOUS) ×3
SET TUBE SMOKE EVAC HIGH FLOW (TUBING) ×1 IMPLANT
SOLUTION ELECTROLUBE (MISCELLANEOUS) ×1 IMPLANT
SUT MNCRL AB 4-0 PS2 18 (SUTURE) ×1 IMPLANT
SUT STRATAFIX PDS 30 CT-1 (SUTURE) ×1 IMPLANT
SUT V-LOC 90 ABS 3-0 VLT  V-20 (SUTURE) ×1
SUT V-LOC 90 ABS 3-0 VLT V-20 (SUTURE) IMPLANT
TAPE TRANSPORE STRL 2 31045 (GAUZE/BANDAGES/DRESSINGS) ×1 IMPLANT
TRAP FLUID SMOKE EVACUATOR (MISCELLANEOUS) ×1 IMPLANT
WATER STERILE IRR 500ML POUR (IV SOLUTION) ×1 IMPLANT

## 2022-08-15 NOTE — Anesthesia Procedure Notes (Signed)
Procedure Name: Intubation Date/Time: 08/15/2022 3:15 PM  Performed by: Beverely Low, CRNAPre-anesthesia Checklist: Patient identified, Patient being monitored, Timeout performed, Emergency Drugs available and Suction available Patient Re-evaluated:Patient Re-evaluated prior to induction Oxygen Delivery Method: Circle system utilized Preoxygenation: Pre-oxygenation with 100% oxygen Induction Type: IV induction Ventilation: Mask ventilation without difficulty Laryngoscope Size: 3 and McGraph Grade View: Grade I Tube type: Oral Tube size: 6.5 mm Number of attempts: 1 Airway Equipment and Method: Stylet Placement Confirmation: ETT inserted through vocal cords under direct vision, positive ETCO2 and breath sounds checked- equal and bilateral Secured at: 21 cm Tube secured with: Tape Dental Injury: Teeth and Oropharynx as per pre-operative assessment

## 2022-08-15 NOTE — Anesthesia Preprocedure Evaluation (Signed)
Anesthesia Evaluation  Patient identified by MRN, date of birth, ID band Patient awake    Reviewed: Allergy & Precautions, NPO status , Patient's Chart, lab work & pertinent test results  History of Anesthesia Complications Negative for: history of anesthetic complications  Airway Mallampati: II  TM Distance: >3 FB Neck ROM: Full    Dental no notable dental hx. (+) Teeth Intact   Pulmonary neg pulmonary ROS, neg sleep apnea, neg COPD, Patient abstained from smoking.Not current smoker,    Pulmonary exam normal breath sounds clear to auscultation       Cardiovascular Exercise Tolerance: Good METShypertension, (-) CAD and (-) Past MI + dysrhythmias + Valvular Problems/Murmurs  Rhythm:Regular Rate:Normal - Systolic murmurs ? Diagnostic LEFT heart catheterization was performed on 12/16/2006 revealing no evidence of significant obstructive coronary artery disease.  There was significant mitral valve prolapse noted.  ? Patient has a history of significant mitral valve prolapse with associated valvular regurgitation.  She underwent RIGHT thoracotomy and ring valvuloplasty on 12/17/2006 placing a 40 mm Seguin ring.  ? Most recent TTE was performed on 06/04/2018 revealing a low normal left ventricular systolic function with an EF of 50%.  There were no regional wall motion abnormalities. Left ventricular diastolic Doppler parameters consistent with pseudonormalization (G2DD).  Right atrium mildly enlarged.  There was trivial to mild pan valvular regurgitation.  There was no evidence of a significant transvalvular gradient to suggest stenosis.  ? Patient has been hospitalized and worked up in the past for possible atrial fibrillation.  Long-term cardiac event monitor studies have revealed predominant underlying sinus rhythms with frequent PVCs and occasional PACs.  Occasional brief atrial runs have been noted, however there was no definitive  evidence for atrial fibrillation.  Given her atrial and ventricular ectopy, patient is on beta-blockade therapy (metoprolol tartrate) as a preventative.  With no actual documented episodes of atrial fibrillation, the decision was made to defer chronic anticoagulation therapy in this patient.   Neuro/Psych PSYCHIATRIC DISORDERS Anxiety Depression Dementia negative neurological ROS     GI/Hepatic GERD  Controlled,(+)     (-) substance abuse  ,   Endo/Other  neg diabetes  Renal/GU negative Renal ROS     Musculoskeletal  (+) Arthritis ,   Abdominal   Peds  Hematology   Anesthesia Other Findings Past Medical History: No date: Anxiety     Comment:  a.) on BZO (alprazolam) PRN No date: Arthritis No date: Childhood asthma No date: Diastolic dysfunction     Comment:  a.) TTE 06/04/2018: EF 50%, mild RAE, trivial AR/PR,               mild MR/TR, G2DD No date: GERD (gastroesophageal reflux disease) No date: Glaucoma No date: Heart murmur 12/16/2006: History of cardiac cath     Comment:  a.) LHC 12/16/2006: norm coronaries with no obstructive               CAD; definite MVP No date: Hyperlipidemia No date: Hypertension No date: Imbalance No date: Lymphedema of left lower extremity No date: Major depression No date: Memory loss     Comment:  a.) on memantine No date: MVP (mitral valve prolapse)     Comment:  a.) s/p ring valvuloplasty at [facility] 12/17/2006 No date: Numbness in feet No date: PVC (premature ventricular contraction) No date: Raynaud disease 12/17/2006: S/P MVR (mitral valve replacement)     Comment:  a.) ring valvuloplasty via RIGHT thoracotomy approach;  40 mm Seguin ring No date: Skin cancer No date: Spigelian hernia No date: Uterine cancer ([facility])  Reproductive/Obstetrics                             Anesthesia Physical Anesthesia Plan  ASA: 2  Anesthesia Plan: General   Post-op Pain Management: Ofirmev IV  (intra-op)*   Induction: Intravenous  PONV Risk Score and Plan: 4 or greater and Ondansetron, Dexamethasone and Treatment may vary due to age or medical condition  Airway Management Planned: Oral ETT  Additional Equipment: None  Intra-op Plan:   Post-operative Plan: Extubation in OR  Informed Consent: I have reviewed the patients History and Physical, chart, labs and discussed the procedure including the risks, benefits and alternatives for the proposed anesthesia with the patient or authorized representative who has indicated his/her understanding and acceptance.     Dental advisory given  Plan Discussed with: CRNA and Surgeon  Anesthesia Plan Comments: (Discussed risks of anesthesia with patient, including PONV, sore throat, lip/dental/eye damage, post operative cognitive dysfunction. Rare risks discussed as well, such as cardiorespiratory and neurological sequelae, and allergic reactions. Discussed the role of CRNA in patient's perioperative care. Patient understands.)        Anesthesia Quick Evaluation

## 2022-08-15 NOTE — Transfer of Care (Signed)
Immediate Anesthesia Transfer of Care Note  Patient: Whitney Pace  Procedure(s) Performed: XI ROBOTIC ASSISTED VENTRAL HERNIA (Abdomen)  Patient Location: PACU  Anesthesia Type:General  Level of Consciousness: drowsy  Airway & Oxygen Therapy: Patient Spontanous Breathing and Patient connected to face mask oxygen  Post-op Assessment: Report given to RN and Post -op Vital signs reviewed and stable  Post vital signs: Reviewed and stable  Last Vitals:  Vitals Value Taken Time  BP 115/87 08/15/22 1717  Temp    Pulse    Resp 11 08/15/22 1721  SpO2    Vitals shown include unvalidated device data.  Last Pain:  Vitals:   08/15/22 1331  TempSrc: Oral         Complications: No notable events documented.

## 2022-08-15 NOTE — Op Note (Signed)
Preoperative diagnosis: Right spigelian Hernia  Postoperative diagnosis: Right spigelian Hernia  Procedure: Robotic assisted laparoscopic right spigelian hernia repair with mesh  Anesthesia: General  Surgeon: Dr. Windell Moment  Wound Classification: Clean  Specimen: None  Complications: None  Estimated Blood Loss: 33m  Indications: Patient is a 76y.o. female developed a right symptomatic spigelian hernia. This was symptomatic and repair was indicated.   Findings: Right 2 cm spigelian hernia 2. Repair achieved with closure of the anterior fascia at midline and 14 x 10 cm Progrip mesh 3. Adequate hemostasis     Description of procedure: The patient was brought to the operating room and general anesthesia was induced. A time-out was completed verifying correct patient, procedure, site, positioning, and implant(s) and/or special equipment prior to beginning this procedure. Antibiotics were administered prior to making the incision. SCDs placed. The anterior abdominal wall was prepped and draped in the standard sterile fashion.   Palmer's point chosen for entry.  Veress needle placed and abdomen insufflated to 15cm without any dramatic increase in pressure.  Needle removed and optiview technique used to place 836mport at same point.  No injury noted during placement. Exparel was infused in a TAP block. Two additional ports, 46m66m2 along left lateral aspect placed.  Xi robot then docked into place.  Hernia contents noted and reduced with combination of blunt, sharp dissection with scissors and fenestrated forceps.  Hemostasis achieved throughout this portion.  There was noted to be a 2 cm spigelian hernia.    Insufflation dropped to 1m33md transfacial suture with 0 stratafix used to primarily close defect under minimal tension. Progrip mesh cutted to 14 cm x 10 cm was placed within the abdominal cavity placed to the abdominal wall centered over the defect. The peritoneal flap was closed  with 3-0 V lock    Robot was undocked.  Abdomen then desufflated while camera within abdomen to ensure no signs of new bleed prior to removing camera and rest of ports completely. All skin incisions closed with runninrg 4-0 Monocryl in a subcuticular fashion.  All wounds then dressed with Dermabond.  Patient was then successfully awakened and transferred to PACU in stable condition.  At the end of the procedure sponge and instrument counts were correct.

## 2022-08-15 NOTE — Interval H&P Note (Signed)
History and Physical Interval Note:  08/15/2022 2:43 PM  Whitney Pace  has presented today for surgery, with the diagnosis of K43.9 Spigelian hernia.  The various methods of treatment have been discussed with the patient and family. After consideration of risks, benefits and other options for treatment, the patient has consented to  Procedure(s): XI ROBOTIC Lester (N/A) as a surgical intervention.  The patient's history has been reviewed, patient examined, no change in status, stable for surgery.  I have reviewed the patient's chart and labs.  Questions were answered to the patient's satisfaction.     Herbert Pun

## 2022-08-15 NOTE — Anesthesia Postprocedure Evaluation (Signed)
Anesthesia Post Note  Patient: Whitney Pace  Procedure(s) Performed: XI ROBOTIC ASSISTED VENTRAL HERNIA (Abdomen)  Patient location during evaluation: PACU Anesthesia Type: General Level of consciousness: awake and alert Pain management: pain level controlled Vital Signs Assessment: post-procedure vital signs reviewed and stable Respiratory status: spontaneous breathing, nonlabored ventilation, respiratory function stable and patient connected to nasal cannula oxygen Cardiovascular status: blood pressure returned to baseline and stable Postop Assessment: no apparent nausea or vomiting Anesthetic complications: no   No notable events documented.   Last Vitals:  Vitals:   08/15/22 1730 08/15/22 1745  BP: (!) 144/91 (!) 140/87  Pulse: (!) 52 62  Resp: 15 16  Temp:    SpO2: 98% 98%    Last Pain:  Vitals:   08/15/22 1745  TempSrc:   PainSc: 2                  Ilene Qua

## 2022-08-15 NOTE — Discharge Instructions (Addendum)
  Diet: Resume home heart healthy regular diet.   Activity: No heavy lifting >20 pounds (children, pets, laundry, garbage) or strenuous activity until follow-up, but light activity and walking are encouraged. Do not drive or drink alcohol if taking narcotic pain medications.  Wound care: May shower with soapy water and pat dry (do not rub incisions), but no baths or submerging incision underwater until follow-up. (no swimming)   Medications: Resume all home medications. For mild to moderate pain: acetaminophen (Tylenol) ***or ibuprofen (if no kidney disease). Combining Tylenol with alcohol can substantially increase your risk of causing liver disease. Narcotic pain medications, if prescribed, can be used for severe pain, though may cause nausea, constipation, and drowsiness. Do not combine Tylenol and Norco within a 6 hour period as Norco contains Tylenol. If you do not need the narcotic pain medication, you do not need to fill the prescription.  Call office ([phone]) at any time if any questions, worsening pain, fevers/chills, bleeding, drainage from incision site, or other concerns.   AMBULATORY SURGERY  DISCHARGE INSTRUCTIONS   The drugs that you were given will stay in your system until tomorrow so for the next 24 hours you should not:  Drive an automobile Make any legal decisions Drink any alcoholic beverage   You may resume regular meals tomorrow.  Today it is better to start with liquids and gradually work up to solid foods.  You may eat anything you prefer, but it is better to start with liquids, then soup and crackers, and gradually work up to solid foods.   Please notify your doctor immediately if you have any unusual bleeding, trouble breathing, redness and pain at the surgery site, drainage, fever, or pain not relieved by medication.    Additional Instructions:        Please contact your physician with any problems or Same Day Surgery at [phone], Monday  through Friday 6 am to 4 pm, or [place] [facility] [place] [facility] number at [phone].  

## 2022-08-17 ENCOUNTER — Encounter: Payer: Self-pay | Admitting: General Surgery

## 2022-09-03 ENCOUNTER — Encounter (INDEPENDENT_AMBULATORY_CARE_PROVIDER_SITE_OTHER): Payer: Self-pay

## 2022-11-14 DIAGNOSIS — F0280 Dementia in other diseases classified elsewhere without behavioral disturbance: Secondary | ICD-10-CM | POA: Diagnosis present

## 2022-11-23 ENCOUNTER — Ambulatory Visit
Admission: RE | Admit: 2022-11-23 | Discharge: 2022-11-23 | Disposition: A | Discharge status: Home / Self Care | Payer: Medicare Other | Source: Ambulatory Visit | Attending: Internal Medicine | Admitting: Internal Medicine

## 2022-11-23 DIAGNOSIS — Z1231 Encounter for screening mammogram for malignant neoplasm of breast: Secondary | ICD-10-CM | POA: Insufficient documentation

## 2023-01-09 ENCOUNTER — Other Ambulatory Visit: Payer: Self-pay | Admitting: General Surgery

## 2023-01-09 DIAGNOSIS — R1031 Right lower quadrant pain: Secondary | ICD-10-CM

## 2023-01-23 ENCOUNTER — Ambulatory Visit
Admission: RE | Admit: 2023-01-23 | Discharge: 2023-01-23 | Disposition: A | Discharge status: Home / Self Care | Payer: Medicare Other | Source: Ambulatory Visit | Attending: General Surgery | Admitting: General Surgery

## 2023-01-23 DIAGNOSIS — R1031 Right lower quadrant pain: Secondary | ICD-10-CM

## 2023-03-29 ENCOUNTER — Encounter (INDEPENDENT_AMBULATORY_CARE_PROVIDER_SITE_OTHER): Payer: Self-pay | Admitting: Vascular Surgery

## 2023-03-29 ENCOUNTER — Ambulatory Visit (INDEPENDENT_AMBULATORY_CARE_PROVIDER_SITE_OTHER): Payer: Medicare Other | Admitting: Vascular Surgery

## 2023-03-29 VITALS — BP 126/81 | HR 50 | Resp 18 | Ht 63.0 in | Wt 132.2 lb

## 2023-03-29 DIAGNOSIS — E782 Mixed hyperlipidemia: Secondary | ICD-10-CM

## 2023-03-29 DIAGNOSIS — I890 Lymphedema, not elsewhere classified: Secondary | ICD-10-CM | POA: Diagnosis not present

## 2023-03-29 DIAGNOSIS — I10 Essential (primary) hypertension: Secondary | ICD-10-CM | POA: Diagnosis not present

## 2023-03-29 DIAGNOSIS — I872 Venous insufficiency (chronic) (peripheral): Secondary | ICD-10-CM | POA: Diagnosis not present

## 2023-03-29 NOTE — Progress Notes (Signed)
MRN : [id number]  Whitney Pace is a 77 y.o. (08-26-1946) female who presents with chief complaint of  Chief Complaint  Patient presents with   Follow-up    f/u in 1 year with no studies  .  History of Present Illness: Patient returns today in follow up of her lymphedema and venous disease.  She wears compression socks and her legs are not really swelling at all at this point.  She does have some symptoms of neuropathy with balance disturbances and numbness.  Not really a painful situation.  No open wounds or infection.  Current Outpatient Medications  Medication Sig Dispense Refill   ALPRAZolam (XANAX) 0.25 MG tablet Take 0.25 mg by mouth 2 (two) times daily as needed for anxiety.      amitriptyline (ELAVIL) 10 MG tablet Take 10 mg by mouth at bedtime.     aspirin EC 81 MG tablet Take 81 mg by mouth at bedtime.      brimonidine-timolol (COMBIGAN) 0.2-0.5 % ophthalmic solution Place 1 drop into both eyes every 12 (twelve) hours.     Carboxymethylcellulose Sod PF 1 % GEL Apply 1 drop to eye as needed.     dorzolamide (TRUSOPT) 2 % ophthalmic solution Place 1 drop into the left eye 3 (three) times daily.     galantamine (RAZADYNE) 12 MG tablet Take 12 mg by mouth 2 (two) times daily.     ketorolac (ACULAR) 0.4 % SOLN Place 1 drop into the left eye 4 (four) times daily.     lovastatin (MEVACOR) 40 MG tablet Take 40 mg by mouth at bedtime.     memantine (NAMENDA) 10 MG tablet Take 10 mg by mouth 2 (two) times daily.     metoprolol tartrate (LOPRESSOR) 25 MG tablet Take 1 tablet (25 mg total) by mouth 2 (two) times daily. 60 tablet 0   Multiple Vitamin (MULTIVITAMIN WITH MINERALS) TABS tablet Take 1 tablet by mouth daily.      PARoxetine (PAXIL) 30 MG tablet Take 30 mg by mouth at bedtime.     prednisoLONE acetate (PRED FORTE) 1 % ophthalmic suspension Place 1 drop into the left eye 2 (two) times daily.     traZODone (DESYREL) 50 MG tablet Take 1 tablet by mouth at bedtime.     vitamin C  (ASCORBIC ACID) 500 MG tablet Take 500 mg by mouth daily. (Patient not taking: Reported on 03/29/2023)     No current facility-administered medications for this visit.    Past Medical History:  Diagnosis Date   Anxiety    a.) on BZO (alprazolam) PRN   Arthritis    Childhood asthma    Diastolic dysfunction    a.) TTE 06/04/2018: EF 50%, mild RAE, trivial AR/PR, mild MR/TR, G2DD   GERD (gastroesophageal reflux disease)    Glaucoma    Heart murmur    History of cardiac cath 12/16/2006   a.) LHC 12/16/2006: norm coronaries with no obstructive CAD; definite MVP   Hyperlipidemia    Hypertension    Imbalance    Lymphedema of left lower extremity    Major depression    Memory loss    a.) on memantine   MVP (mitral valve prolapse)    a.) s/p ring valvuloplasty at [facility] 12/17/2006   Numbness in feet    PVC (premature ventricular contraction)    Raynaud disease    S/P MVR (mitral valve replacement) 12/17/2006   a.) ring valvuloplasty via RIGHT thoracotomy approach; 40 mm Seguin ring  Skin cancer    Spigelian hernia    Uterine cancer (HCC)     Past Surgical History:  Procedure Laterality Date   BREAST CYST ASPIRATION Bilateral    negative   CATARACT EXTRACTION W/ INTRAOCULAR LENS IMPLANT Bilateral    EYE SURGERY Left    repair detatched retina x 2, removal of "bubble' AND SCAR TISSUE IN EYE   EYE SURGERY Left    laser treatment left eye x 3   LEFT HEART CATH AND CORONARY ANGIOGRAPHY Left 12/16/2006   Procedure: LEFT HEART CATH AND CORONARY ANGIOGRAPHY; Location: [facility]; Surgeon: Teryl Lucy, MD   MITRAL VALVE REPAIR Right 12/17/2006   Procedure: RIGHT THORACOTOMY AND RING VALVULOPLASTY (MITRAL); Location: [facility]   TOTAL ABDOMINAL HYSTERECTOMY W/ BILATERAL SALPINGOOPHORECTOMY     TOTAL HIP ARTHROPLASTY Left 10/15/2016   Procedure: LEFT TOTAL HIP ARTHROPLASTY ANTERIOR APPROACH;  Surgeon: Durene Romans, MD;  Location: [facility];  Service: Orthopedics;  Laterality: Left;   XI ROBOTIC  ASSISTED VENTRAL HERNIA N/A 08/15/2022   Procedure: XI ROBOTIC ASSISTED VENTRAL HERNIA;  Surgeon: Carolan Shiver, MD;  Location: [facility];  Service: General;  Laterality: N/A;     Social History   Tobacco Use   Smoking status: Never   Smokeless tobacco: Never  Vaping Use   Vaping Use: Never used  Substance Use Topics   Alcohol use: No    Comment: wine rarely   Drug use: No      Family History  Problem Relation Age of Onset   Breast cancer Paternal Aunt        two aunts in their 3's  No bleeding or clotting disorders  Allergies  Allergen Reactions   Atropine Other (See Comments)    Increased blood pressure   Escitalopram Oxalate     Other reaction(s): Other (See Comments) Caused her to feel jittery   Neomycin-Bacitracin Zn-Polymyx     Other reaction(s): Unknown Hypertension    Promethazine Other (See Comments)    anxiety   Rivastigmine     Other reaction(s): Vomiting   Rivastigmine Tartrate Nausea Only   Tramadol Nausea And Vomiting    Patient also states flushing and throat was hoarse   Venlafaxine     Other reaction(s): Hallucination    REVIEW OF SYSTEMS (Negative unless checked)   Constitutional: [] Weight loss  [] Fever  [] Chills Cardiac: [] Chest pain   [] Chest pressure   [] Palpitations   [] Shortness of breath when laying flat   [] Shortness of breath at rest   [] Shortness of breath with exertion. Vascular:  [] Pain in legs with walking   [] Pain in legs at rest   [] Pain in legs when laying flat   [] Claudication   [] Pain in feet when walking  [] Pain in feet at rest  [] Pain in feet when laying flat   [] History of DVT   [] Phlebitis   [x] Swelling in legs   [x] Varicose veins   [] Non-healing ulcers Pulmonary:   [] Uses home oxygen   [] Productive cough   [] Hemoptysis   [] Wheeze  [] COPD   [x] Asthma Neurologic:  [] Dizziness  [] Blackouts   [] Seizures   [] History of stroke   [] History of TIA  [] Aphasia   [] Temporary blindness   [] Dysphagia   [] Weakness or numbness in  arms   [] Weakness or numbness in legs Musculoskeletal:  [] Arthritis   [] Joint swelling   [] Joint pain   [] Low back pain Hematologic:  [] Easy bruising  [] Easy bleeding   [] Hypercoagulable state   [] Anemic   Gastrointestinal:  [] Blood in stool   []   Vomiting blood  [x] Gastroesophageal reflux/heartburn   [] Abdominal pain Genitourinary:  [] Chronic kidney disease   [] Difficult urination  [] Frequent urination  [] Burning with urination   [] Hematuria Skin:  [] Rashes   [] Ulcers   [] Wounds Psychological:  [x] History of anxiety   []  History of major depression.  Physical Examination  BP 126/81 (BP Location: Right Arm)   Pulse (!) 50   Resp 18   Ht 5\' 3"  (1.6 m)   Wt 132 lb 3.2 oz (60 kg)   BMI 23.42 kg/m  Gen:  WD/WN, NAD Head: [place]/AT, No temporalis wasting. Ear/Nose/Throat: Hearing grossly intact, nares w/o erythema or drainage Eyes: Conjunctiva clear. Sclera non-icteric Neck: Supple.  Trachea midline Pulmonary:  Good air movement, no use of accessory muscles.  Cardiac: RRR, no JVD Vascular:  Vessel Right Left  Radial Palpable Palpable                          PT Palpable Palpable  DP Palpable Palpable   Gastrointestinal: soft, non-tender/non-distended. No guarding/reflex.  Musculoskeletal: M/S 5/5 throughout.  No deformity or atrophy.  No significant lower extremity edema. Neurologic: Sensation grossly intact in extremities.  Symmetrical.  Speech is fluent.  Psychiatric: Judgment intact, Mood & affect appropriate for pt's clinical situation. Dermatologic: No rashes or ulcers noted.  No cellulitis or open wounds.      Labs No results found for this or any previous visit (from the past 2160 hour(s)).  Radiology No results found.  Assessment/Plan Essential hypertension blood pressure control important in reducing the progression of atherosclerotic disease. On appropriate oral medications.     Combined hyperlipidemia lipid control important in reducing the progression of  atherosclerotic disease. Continue statin therapy   Lymphedema Symptom control is quite good with elevation, compression, and activity.  Continue conservative measures.  Return to clinic as needed.  Chronic venous insufficiency Minimal to no symptoms at this point so would not plan any further workup or intervention unless she has worsening symptoms.    Festus Barren, MD  03/29/2023 10:47 AM    This note was created with Dragon medical transcription system.  Any errors from dictation are purely unintentional

## 2023-03-29 NOTE — Assessment & Plan Note (Signed)
Minimal to no symptoms at this point so would not plan any further workup or intervention unless she has worsening symptoms.

## 2023-10-25 ENCOUNTER — Other Ambulatory Visit: Payer: Self-pay | Admitting: Internal Medicine

## 2023-10-25 DIAGNOSIS — Z1231 Encounter for screening mammogram for malignant neoplasm of breast: Secondary | ICD-10-CM

## 2023-12-06 ENCOUNTER — Ambulatory Visit
Admission: RE | Admit: 2023-12-06 | Discharge: 2023-12-06 | Disposition: A | Discharge status: Home / Self Care | Payer: Medicare Other | Source: Ambulatory Visit | Attending: Internal Medicine | Admitting: Internal Medicine

## 2023-12-06 DIAGNOSIS — Z1231 Encounter for screening mammogram for malignant neoplasm of breast: Secondary | ICD-10-CM | POA: Insufficient documentation

## 2024-03-24 ENCOUNTER — Encounter (INDEPENDENT_AMBULATORY_CARE_PROVIDER_SITE_OTHER): Payer: Self-pay

## 2024-08-18 ENCOUNTER — Other Ambulatory Visit: Payer: Self-pay

## 2024-08-18 ENCOUNTER — Emergency Department

## 2024-08-18 ENCOUNTER — Inpatient Hospital Stay
Admission: EM | Admit: 2024-08-18 | Discharge: 2024-08-21 | DRG: 308 | Disposition: A | Discharge status: Home Health | Attending: Student in an Organized Health Care Education/Training Program | Admitting: Student in an Organized Health Care Education/Training Program

## 2024-08-18 DIAGNOSIS — N179 Acute kidney failure, unspecified: Secondary | ICD-10-CM | POA: Diagnosis present

## 2024-08-18 DIAGNOSIS — Z888 Allergy status to other drugs, medicaments and biological substances status: Secondary | ICD-10-CM

## 2024-08-18 DIAGNOSIS — I110 Hypertensive heart disease with heart failure: Secondary | ICD-10-CM | POA: Diagnosis present

## 2024-08-18 DIAGNOSIS — G309 Alzheimer's disease, unspecified: Secondary | ICD-10-CM | POA: Diagnosis present

## 2024-08-18 DIAGNOSIS — Z803 Family history of malignant neoplasm of breast: Secondary | ICD-10-CM

## 2024-08-18 DIAGNOSIS — G4700 Insomnia, unspecified: Secondary | ICD-10-CM | POA: Diagnosis present

## 2024-08-18 DIAGNOSIS — I509 Heart failure, unspecified: Secondary | ICD-10-CM | POA: Diagnosis present

## 2024-08-18 DIAGNOSIS — J9601 Acute respiratory failure with hypoxia: Principal | ICD-10-CM | POA: Diagnosis present

## 2024-08-18 DIAGNOSIS — I7300 Raynaud's syndrome without gangrene: Secondary | ICD-10-CM | POA: Diagnosis present

## 2024-08-18 DIAGNOSIS — Z7982 Long term (current) use of aspirin: Secondary | ICD-10-CM

## 2024-08-18 DIAGNOSIS — K219 Gastro-esophageal reflux disease without esophagitis: Secondary | ICD-10-CM | POA: Diagnosis present

## 2024-08-18 DIAGNOSIS — Z883 Allergy status to other anti-infective agents status: Secondary | ICD-10-CM

## 2024-08-18 DIAGNOSIS — Z7901 Long term (current) use of anticoagulants: Secondary | ICD-10-CM

## 2024-08-18 DIAGNOSIS — Z85828 Personal history of other malignant neoplasm of skin: Secondary | ICD-10-CM

## 2024-08-18 DIAGNOSIS — Z961 Presence of intraocular lens: Secondary | ICD-10-CM | POA: Diagnosis present

## 2024-08-18 DIAGNOSIS — E785 Hyperlipidemia, unspecified: Secondary | ICD-10-CM | POA: Diagnosis present

## 2024-08-18 DIAGNOSIS — I493 Ventricular premature depolarization: Secondary | ICD-10-CM | POA: Diagnosis present

## 2024-08-18 DIAGNOSIS — R0609 Other forms of dyspnea: Secondary | ICD-10-CM

## 2024-08-18 DIAGNOSIS — Z885 Allergy status to narcotic agent status: Secondary | ICD-10-CM

## 2024-08-18 DIAGNOSIS — Z952 Presence of prosthetic heart valve: Secondary | ICD-10-CM

## 2024-08-18 DIAGNOSIS — I5181 Takotsubo syndrome: Secondary | ICD-10-CM | POA: Diagnosis present

## 2024-08-18 DIAGNOSIS — I255 Ischemic cardiomyopathy: Secondary | ICD-10-CM | POA: Diagnosis present

## 2024-08-18 DIAGNOSIS — I4891 Unspecified atrial fibrillation: Secondary | ICD-10-CM

## 2024-08-18 DIAGNOSIS — I341 Nonrheumatic mitral (valve) prolapse: Secondary | ICD-10-CM | POA: Diagnosis present

## 2024-08-18 DIAGNOSIS — L8990 Pressure ulcer of unspecified site, unspecified stage: Secondary | ICD-10-CM

## 2024-08-18 DIAGNOSIS — I480 Paroxysmal atrial fibrillation: Principal | ICD-10-CM | POA: Diagnosis present

## 2024-08-18 DIAGNOSIS — F02818 Dementia in other diseases classified elsewhere, unspecified severity, with other behavioral disturbance: Secondary | ICD-10-CM | POA: Diagnosis present

## 2024-08-18 DIAGNOSIS — F0280 Dementia in other diseases classified elsewhere without behavioral disturbance: Secondary | ICD-10-CM | POA: Diagnosis present

## 2024-08-18 DIAGNOSIS — Z9841 Cataract extraction status, right eye: Secondary | ICD-10-CM

## 2024-08-18 DIAGNOSIS — I872 Venous insufficiency (chronic) (peripheral): Secondary | ICD-10-CM | POA: Diagnosis present

## 2024-08-18 DIAGNOSIS — Z96642 Presence of left artificial hip joint: Secondary | ICD-10-CM | POA: Diagnosis present

## 2024-08-18 DIAGNOSIS — I081 Rheumatic disorders of both mitral and tricuspid valves: Secondary | ICD-10-CM | POA: Diagnosis present

## 2024-08-18 DIAGNOSIS — Z8542 Personal history of malignant neoplasm of other parts of uterus: Secondary | ICD-10-CM

## 2024-08-18 DIAGNOSIS — Z90710 Acquired absence of both cervix and uterus: Secondary | ICD-10-CM

## 2024-08-18 DIAGNOSIS — Z9079 Acquired absence of other genital organ(s): Secondary | ICD-10-CM

## 2024-08-18 DIAGNOSIS — I10 Essential (primary) hypertension: Secondary | ICD-10-CM | POA: Diagnosis present

## 2024-08-18 DIAGNOSIS — Z90722 Acquired absence of ovaries, bilateral: Secondary | ICD-10-CM

## 2024-08-18 DIAGNOSIS — Z9842 Cataract extraction status, left eye: Secondary | ICD-10-CM

## 2024-08-18 LAB — CBC
HCT: 43.3 % (ref 36.0–46.0)
Hemoglobin: 14 g/dL (ref 12.0–15.0)
MCH: 30.4 pg (ref 26.0–34.0)
MCHC: 32.3 g/dL (ref 30.0–36.0)
MCV: 93.9 fL (ref 80.0–100.0)
Platelets: 245 K/uL (ref 150–400)
RBC: 4.61 MIL/uL (ref 3.87–5.11)
RDW: 13.6 % (ref 11.5–15.5)
WBC: 7.1 K/uL (ref 4.0–10.5)
nRBC: 0 % (ref 0.0–0.2)

## 2024-08-18 LAB — BASIC METABOLIC PANEL WITH GFR
Anion gap: 13 (ref 5–15)
BUN: 22 mg/dL (ref 8–23)
CO2: 20 mmol/L — ABNORMAL LOW (ref 22–32)
Calcium: 9 mg/dL (ref 8.9–10.3)
Chloride: 105 mmol/L (ref 98–111)
Creatinine, Ser: 1.02 mg/dL — ABNORMAL HIGH (ref 0.44–1.00)
GFR, Estimated: 56 mL/min — ABNORMAL LOW (ref >60)
Glucose, Bld: 165 mg/dL — ABNORMAL HIGH (ref 70–99)
Potassium: 3.9 mmol/L (ref 3.5–5.1)
Sodium: 138 mmol/L (ref 135–145)

## 2024-08-18 LAB — HEPATIC FUNCTION PANEL
ALT: 31 U/L (ref 0–44)
AST: 42 U/L — ABNORMAL HIGH (ref 15–41)
Albumin: 4 g/dL (ref 3.5–5.0)
Alkaline Phosphatase: 77 U/L (ref 38–126)
Bilirubin, Direct: 0.2 mg/dL (ref 0.0–0.2)
Indirect Bilirubin: 0.5 mg/dL (ref 0.3–0.9)
Total Bilirubin: 0.7 mg/dL (ref 0.0–1.2)
Total Protein: 6.9 g/dL (ref 6.5–8.1)

## 2024-08-18 LAB — BRAIN NATRIURETIC PEPTIDE: B Natriuretic Peptide: 561.4 pg/mL — ABNORMAL HIGH (ref 0.0–100.0)

## 2024-08-18 LAB — TROPONIN I (HIGH SENSITIVITY)
Troponin I (High Sensitivity): 10 ng/L (ref <18)
Troponin I (High Sensitivity): 16 ng/L (ref <18)

## 2024-08-18 LAB — TSH: TSH: 1.857 u[IU]/mL (ref 0.350–4.500)

## 2024-08-18 LAB — MAGNESIUM: Magnesium: 2.5 mg/dL — ABNORMAL HIGH (ref 1.7–2.4)

## 2024-08-18 MED ORDER — PRAVASTATIN SODIUM 20 MG PO TABS
20.0000 mg | ORAL_TABLET | Freq: Every day | ORAL | Status: DC
  Administered 2024-08-19 – 2024-08-20 (×2): 20 mg via ORAL
  Filled 2024-08-18 (×2): qty 1

## 2024-08-18 MED ORDER — ONDANSETRON HCL 4 MG/2ML IJ SOLN
4.0000 mg | Freq: Four times a day (QID) | INTRAMUSCULAR | Status: DC | PRN
  Administered 2024-08-19 (×2): 4 mg via INTRAVENOUS
  Filled 2024-08-18 (×2): qty 2

## 2024-08-18 MED ORDER — DILTIAZEM HCL 25 MG/5ML IV SOLN
10.0000 mg | Freq: Once | INTRAVENOUS | Status: AC
  Administered 2024-08-18: 10 mg via INTRAVENOUS
  Filled 2024-08-18: qty 5

## 2024-08-18 MED ORDER — DILTIAZEM HCL-DEXTROSE 125-5 MG/125ML-% IV SOLN (PREMIX)
5.0000 mg/h | INTRAVENOUS | Status: DC
  Administered 2024-08-18 – 2024-08-19 (×2): 5 mg/h via INTRAVENOUS
  Filled 2024-08-18: qty 125

## 2024-08-18 MED ORDER — ENOXAPARIN SODIUM 40 MG/0.4ML IJ SOSY: 40.0000 mg | PREFILLED_SYRINGE | INTRAMUSCULAR | Status: DC

## 2024-08-18 MED ORDER — ASPIRIN 81 MG PO TBEC
81.0000 mg | DELAYED_RELEASE_TABLET | Freq: Every day | ORAL | Status: DC
  Administered 2024-08-18: 81 mg via ORAL
  Filled 2024-08-18: qty 1

## 2024-08-18 MED ORDER — ACETAMINOPHEN 325 MG PO TABS: 650.0000 mg | ORAL_TABLET | ORAL | Status: DC | PRN

## 2024-08-18 NOTE — Assessment & Plan Note (Signed)
 BNP in the 500s and chest x-ray showing patchy bilateral lower lung infiltrates Not symptomatic for shortness of breath, orthopnea.  Lower extremity edema no worse than baseline Will trial Lasix if shortness of breath Echocardiogram Daily weights

## 2024-08-18 NOTE — Assessment & Plan Note (Signed)
 No acute issues.

## 2024-08-18 NOTE — Assessment & Plan Note (Signed)
 No acute issues suspected Echocardiogram to evaluate valve

## 2024-08-18 NOTE — Assessment & Plan Note (Signed)
 Continue home meds Delirium precautions

## 2024-08-18 NOTE — Assessment & Plan Note (Addendum)
 Last episode of A-fib 2019 however Holter was more supportive of PACs Now appears to be A-fib on EKG--> defer to cardiology for final rhythm identification Will continue diltiazem infusion to transition to oral per protocol Echocardiogram Continuous cardiac monitoring Continue aspirin  for now.   Holding home metoprolol  due to soft blood pressure with diltiazem Cardiology to decide on long-term anticoagulation Cardiology consult

## 2024-08-18 NOTE — H&P (Signed)
 History and Physical    Patient: Whitney Pace DOB: 12-24-1945 DOA: 08/18/2024 DOS: the patient was seen and examined on 08/18/2024 PCP: Cleotilde Oneil FALCON, MD  Patient coming from: Home  Chief Complaint:  Chief Complaint  Patient presents with   Chest Pain    HPI: Whitney Pace is a 78 y.o. female with medical history significant for HTN, HLD, mitral valve repair 2008, Alzheimer's dementia, PVCs versus paroxysmal [phone], PACs on Holter), not on [facility] (last seen by cardiology 07/14/2024), presenting with a several day history of i multiple episodes of sudden onset shortness of breath associated with chest discomfort and lightheadedness that will just stop her in her tracks..  Denies lower extremity pain or swelling.  Found to be in A-fib with RVR on arrival. In the ED in rapid A-fib to 142 with otherwise normal vitals Labs notable for troponin 10, BNP 561 Potassium and magnesium  normal Creatinine 1.02 (0.9, 01/2024).  BMP otherwise unremarkable CBC normal Hepatic function panel notable only for slightly elevated AST of 42. EKG with A-fib at 142 Chest x-ray showing patchy bilateral lower lung infiltrates and mild cardiomegaly, no pulmonary edema or pleural effusion Patient was given a diltiazem bolus started on an infusion. Admission requested     Past Medical History:  Diagnosis Date   Anxiety    a.) on BZO (alprazolam ) PRN   Arthritis    Childhood asthma    Diastolic dysfunction    a.) TTE 06/04/2018: EF 50%, mild RAE, trivial AR/PR, mild MR/TR, G2DD   GERD (gastroesophageal reflux disease)    Glaucoma    Heart murmur    History of cardiac cath 12/16/2006   a.) LHC 12/16/2006: norm coronaries with no obstructive CAD; definite MVP   Hyperlipidemia    Hypertension    Imbalance    Lymphedema of left lower extremity    Major depression    Memory loss    a.) on memantine   MVP (mitral valve prolapse)    a.) s/p ring valvuloplasty at [facility] 12/17/2006   Numbness in  feet    PVC (premature ventricular contraction)    Raynaud disease    S/P MVR (mitral valve replacement) 12/17/2006   a.) ring valvuloplasty via RIGHT thoracotomy approach; 40 mm Seguin ring   Skin cancer    Spigelian hernia    Uterine cancer (HCC)    Past Surgical History:  Procedure Laterality Date   BREAST CYST ASPIRATION Bilateral    negative   CATARACT EXTRACTION W/ INTRAOCULAR LENS IMPLANT Bilateral    EYE SURGERY Left    repair detatched retina x 2, removal of bubble' AND SCAR TISSUE IN EYE   EYE SURGERY Left    laser treatment left eye x 3   LEFT HEART CATH AND CORONARY ANGIOGRAPHY Left 12/16/2006   Procedure: LEFT HEART CATH AND CORONARY ANGIOGRAPHY; Location: [facility]; Surgeon: Rodgers Sink, MD   MITRAL VALVE REPAIR Right 12/17/2006   Procedure: RIGHT THORACOTOMY AND RING VALVULOPLASTY (MITRAL); Location: [facility]   TOTAL ABDOMINAL HYSTERECTOMY W/ BILATERAL SALPINGOOPHORECTOMY     TOTAL HIP ARTHROPLASTY Left 10/15/2016   Procedure: LEFT TOTAL HIP ARTHROPLASTY ANTERIOR APPROACH;  Surgeon: Donnice Car, MD;  Location: [facility];  Service: Orthopedics;  Laterality: Left;   XI ROBOTIC ASSISTED VENTRAL HERNIA N/A 08/15/2022   Procedure: XI ROBOTIC ASSISTED VENTRAL HERNIA;  Surgeon: Rodolph Romano, MD;  Location: [facility] ORS;  Service: General;  Laterality: N/A;   Social History:  reports that she has never smoked. She has never used  smokeless tobacco. She reports that she does not drink alcohol  and does not use drugs.  Allergies  Allergen Reactions   Atropine Other (See Comments)    Increased blood pressure   Escitalopram Oxalate     Other reaction(s): Other (See Comments) Caused her to feel jittery   Neomycin-Bacitracin Zn-Polymyx     Other reaction(s): Unknown Hypertension    Promethazine Other (See Comments)    anxiety   Rivastigmine     Other reaction(s): Vomiting   Rivastigmine Tartrate Nausea Only   Tramadol Nausea And Vomiting    Patient also states flushing and  throat was hoarse   Venlafaxine     Other reaction(s): Hallucination    Family History  Problem Relation Age of Onset   Breast cancer Paternal Aunt        two aunts in their 22's    Prior to Admission medications   Medication Sig Start Date End Date Taking? Authorizing Provider  ALPRAZolam  (XANAX ) 0.25 MG tablet Take 0.25 mg by mouth 2 (two) times daily as needed for anxiety.     [provider]  amitriptyline (ELAVIL) 10 MG tablet Take 10 mg by mouth at bedtime.    [provider]  aspirin  EC 81 MG tablet Take 81 mg by mouth at bedtime.     [provider]  brimonidine -timolol  (COMBIGAN ) 0.2-0.5 % ophthalmic solution Place 1 drop into both eyes every 12 (twelve) hours.    [provider]  Carboxymethylcellulose Sod PF 1 % GEL Apply 1 drop to eye as needed. 01/23/23   [provider]  dorzolamide  (TRUSOPT ) 2 % ophthalmic solution Place 1 drop into the left eye 3 (three) times daily. 10/07/20   [provider]  galantamine (RAZADYNE) 12 MG tablet Take 12 mg by mouth 2 (two) times daily. 03/20/22   [provider]  ketorolac  (ACULAR ) 0.4 % SOLN Place 1 drop into the left eye 4 (four) times daily. 10/27/21   [provider]  lovastatin (MEVACOR) 40 MG tablet Take 40 mg by mouth at bedtime.    [provider]  memantine (NAMENDA) 10 MG tablet Take 10 mg by mouth 2 (two) times daily.    [provider]  metoprolol  tartrate (LOPRESSOR ) 25 MG tablet Take 1 tablet (25 mg total) by mouth 2 (two) times daily. 02/13/18   Barbette Cea, MD  Multiple Vitamin (MULTIVITAMIN WITH MINERALS) TABS tablet Take 1 tablet by mouth daily.     [provider]  PARoxetine (PAXIL) 30 MG tablet Take 30 mg by mouth at bedtime.    [provider]  traZODone (DESYREL) 50 MG tablet Take 1 tablet by mouth at bedtime. 01/24/23   [provider]  vitamin C  (ASCORBIC ACID ) 500 MG tablet Take 500 mg by mouth  daily. Patient not taking: Reported on 03/29/2023    [provider]    Physical Exam: Vitals:   08/18/24 2103 08/18/24 2120 08/18/24 2130 08/18/24 2150  BP: 96/66 (!) 107/91 108/89 105/86  Pulse: 78 67 65 96  Resp: 17 17 14 17   Temp:      TempSrc:      SpO2: 97% 94% 97% 97%  Weight:      Height:       Physical Exam Vitals and nursing note reviewed.  Constitutional:      General: She is not in acute distress. HENT:     Head: Normocephalic and atraumatic.  Cardiovascular:     Rate and Rhythm: Tachycardia present. Rhythm  irregular.     Heart sounds: Normal heart sounds.  Pulmonary:     Effort: Pulmonary effort is normal.     Breath sounds: Normal breath sounds.  Abdominal:     Palpations: Abdomen is soft.     Tenderness: There is no abdominal tenderness.  Neurological:     Mental Status: Mental status is at baseline.     Labs on Admission: I have personally reviewed following labs and imaging studies  CBC: Recent Labs  Lab 08/18/24 1943  WBC 7.1  HGB 14.0  HCT 43.3  MCV 93.9  PLT 245   Basic Metabolic Panel: Recent Labs  Lab 08/18/24 1943 08/18/24 2056  NA 138  --   K 3.9  --   CL 105  --   CO2 20*  --   GLUCOSE 165*  --   BUN 22  --   CREATININE 1.02*  --   CALCIUM  9.0  --   MG  --  2.5*   GFR: Estimated Creatinine Clearance: 37.8 mL/min (A) (by C-G formula based on SCr of 1.02 mg/dL (H)). Liver Function Tests: Recent Labs  Lab 08/18/24 2056  AST 42*  ALT 31  ALKPHOS 77  BILITOT 0.7  PROT 6.9  ALBUMIN 4.0   No results for input(s): LIPASE, AMYLASE in the last 168 hours. No results for input(s): AMMONIA in the last 168 hours. Coagulation Profile: No results for input(s): INR, PROTIME in the last 168 hours. Cardiac Enzymes: No results for input(s): CKTOTAL, CKMB, CKMBINDEX, TROPONINI in the last 168 hours. BNP (last 3 results) No results for input(s): PROBNP in the last 8760 hours. HbA1C: No results for  input(s): HGBA1C in the last 72 hours. CBG: No results for input(s): GLUCAP in the last 168 hours. Lipid Profile: No results for input(s): CHOL, HDL, LDLCALC, TRIG, CHOLHDL, LDLDIRECT in the last 72 hours. Thyroid  Function Tests: Recent Labs    08/18/24 2056  TSH 1.857   Anemia Panel: No results for input(s): VITAMINB12, FOLATE, FERRITIN, TIBC, IRON, RETICCTPCT in the last 72 hours. Urine analysis:    Component Value Date/Time   COLORURINE YELLOW (A) 10/23/2019 2234   APPEARANCEUR CLEAR (A) 10/23/2019 2234   APPEARANCEUR Clear 11/19/2014 2159   LABSPEC 1.012 10/23/2019 2234   LABSPEC 1.013 11/19/2014 2159   PHURINE 6.0 10/23/2019 2234   GLUCOSEU NEGATIVE 10/23/2019 2234   GLUCOSEU Negative 11/19/2014 2159   HGBUR NEGATIVE 10/23/2019 2234   BILIRUBINUR NEGATIVE 10/23/2019 2234   BILIRUBINUR Negative 11/19/2014 2159   KETONESUR 5 (A) 10/23/2019 2234   PROTEINUR NEGATIVE 10/23/2019 2234   NITRITE NEGATIVE 10/23/2019 2234   LEUKOCYTESUR LARGE (A) 10/23/2019 2234   LEUKOCYTESUR 1+ 11/19/2014 2159    Radiological Exams on Admission: DG Chest Port 1 View Result Date: 08/18/2024 EXAM: 1 VIEW XRAY OF THE CHEST 08/18/2024 08:14:00 PM COMPARISON: Chest x-ray 02/11/2018. CLINICAL HISTORY: Chest pain. The patient states multiple times this week she has had episodes of feeling light headed and chest pain. Pt reports pain in chest was worse tonight. Hx of HTN and mitral valve repair. FINDINGS: LUNGS AND PLEURA: There are patchy bilateral lower lung infiltrates. No pulmonary edema. No pleural effusion. No pneumothorax. HEART AND MEDIASTINUM: Heart is mildly enlarged. BONES AND SOFT TISSUES: No acute osseous abnormality. IMPRESSION: 1. Patchy bilateral lower lung infiltrates. 2. Mild cardiomegaly. Electronically signed by: Greig Pique MD 08/18/2024 08:25 PM EDT RP Workstation: [id number]   Data Reviewed for HPI: Relevant notes from primary care and specialist  visits, past discharge  summaries as available in EHR, including Care Everywhere. Prior diagnostic testing as pertinent to current admission diagnoses Updated medications and problem lists for reconciliation ED course, including vitals, labs, imaging, treatment and response to treatment Triage notes, nursing and pharmacy notes and ED provider's notes Notable results as noted above in HPI      Assessment and Plan: * Paroxysmal atrial fibrillation with rapid ventricular response (HCC) Last episode of A-fib 2019 however Holter was more supportive of PACs Now appears to be A-fib on EKG--> defer to cardiology for final rhythm identification Will continue diltiazem infusion to transition to oral per protocol Echocardiogram Continuous cardiac monitoring Continue aspirin  for now.   Holding home metoprolol  due to soft blood pressure with diltiazem Cardiology to decide on long-term anticoagulation Cardiology consult  Dyspnea on exertion CHF versus acute PE versus both Patient reports episodic dyspnea on exertion associated with chest discomfort and lightheadedness D-dimer elevated to 1.9 Getting CTA chest Will empirically treat with weight-based Lovenox  pending result  Possible acute CHF (HCC) BNP in the 500s and chest x-ray showing patchy bilateral lower lung infiltrates Not symptomatic for shortness of breath, orthopnea.  Lower extremity edema no worse than baseline Will trial Lasix if shortness of breath Echocardiogram Daily weights   Alzheimer's dementia without behavioral disturbance (HCC) Continue home meds Delirium precautions  MVP (mitral valve prolapse) s/p repair 2008 No acute issues suspected Echocardiogram to evaluate valve  Chronic venous insufficiency No acute issues  Essential hypertension Holding metoprolol  due to soft blood pressure with diltiazem infusion     DVT prophylaxis: Lovenox   Consults: cardiology  Advance Care Planning:   Code Status: Prior    Family Communication: none  Disposition Plan: Back to previous home environment  Severity of Illness: The appropriate patient status for this patient is OBSERVATION. Observation status is judged to be reasonable and necessary in order to provide the required intensity of service to ensure the patient's safety. The patient's presenting symptoms, physical exam findings, and initial radiographic and laboratory data in the context of their medical condition is felt to place them at decreased risk for further clinical deterioration. Furthermore, it is anticipated that the patient will be medically stable for discharge from the hospital within 2 midnights of admission.   Author: Delayne LULLA Solian, MD 08/18/2024 10:32 PM  For on call review [email or web address].

## 2024-08-18 NOTE — ED Provider Notes (Signed)
 [facility] Provider Note    Event Date/Time   First MD Initiated Contact with Patient 08/18/24 1947     (approximate)   History   Chest Pain   HPI  Whitney Pace is a 78 y.o. female who comes in with concerns for palpitations.  Patient reports having multiple episodes of chest pain and lightheadedness.   Reviewed a cardiology note from 07/14/2024 where patient was hospitalized for possible A-fib patient was discharged home on metoprolol  25 twice daily.  Family and patient report this been ongoing for a week.  She reports just having these episodes it is not feeling well with lightheadedness.  She reports that today got to the point where she could not walk and she just felt like she was about to pass out which is what prompted her to come in.  They deny any falls or hitting her head any chest pain, abdominal pain.  They deny any new swelling in legs.  They do report some decreased p.o. intake.   Physical Exam   Triage Vital Signs: ED Triage Vitals  Encounter Vitals Group     BP 08/18/24 1937 (!) 125/90     Girls Systolic BP Percentile --      Girls Diastolic BP Percentile --      Boys Systolic BP Percentile --      Boys Diastolic BP Percentile --      Pulse Rate 08/18/24 1937 (!) 142     Resp 08/18/24 1937 17     Temp 08/18/24 1937 97.8 F (36.6 C)     Temp Source 08/18/24 1937 Axillary     SpO2 08/18/24 1937 95 %     Weight 08/18/24 1936 135 lb (61.2 kg)     Height 08/18/24 1936 5' 2 (1.575 m)     Head Circumference --      Peak Flow --      Pain Score 08/18/24 1936 0     Pain Loc --      Pain Education --      Exclude from Growth Chart --     Most recent vital signs: Vitals:   08/18/24 1937  BP: (!) 125/90  Pulse: (!) 142  Resp: 17  Temp: 97.8 F (36.6 C)  SpO2: 95%     General: Awake, no distress.  CV:  Good peripheral perfusion.  Irregular, tachycardic Resp:  Normal effort.  Abd:  No distention.  Soft and  nontender Other:  No swelling legs.  No calf tenderness   ED Results / Procedures / Treatments   Labs (all labs ordered are listed, but only abnormal results are displayed) Labs Reviewed  BASIC METABOLIC PANEL WITH GFR  CBC  TROPONIN I (HIGH SENSITIVITY)     EKG  My interpretation of EKG:  Atrial fibrillation with a rate of 142 without any ST elevation or T wave inversions, occasional PVCs  RADIOLOGY I have reviewed the xray personally and interpreted some edema noted   PROCEDURES:  Critical Care performed: Yes, see critical care procedure note(s)  .1-3 Lead EKG Interpretation  Performed by: Ernest Ronal BRAVO, MD Authorized by: Ernest Ronal BRAVO, MD     Interpretation: abnormal     ECG rate:  130   ECG rate assessment: tachycardic     Rhythm: atrial fibrillation     Ectopy: none     Conduction: normal   .Critical Care  Performed by: Ernest Ronal BRAVO, MD Authorized by: Ernest Ronal BRAVO, MD  Critical care provider statement:    Critical care time (minutes):  30   Critical care was necessary to treat or prevent imminent or life-threatening deterioration of the following conditions:  Cardiac failure   Critical care was time spent personally by me on the following activities:  Development of treatment plan with patient or surrogate, discussions with consultants, evaluation of patient's response to treatment, examination of patient, ordering and review of laboratory studies, ordering and review of radiographic studies, ordering and performing treatments and interventions, pulse oximetry, re-evaluation of patient's condition and review of old charts    MEDICATIONS ORDERED IN ED: Medications  diltiazem (CARDIZEM) 125 mg in dextrose  5% 125 mL (1 mg/mL) infusion (5 mg/hr Intravenous New Bag/Given 08/18/24 2041)  diltiazem (CARDIZEM) injection 10 mg (10 mg Intravenous Given 08/18/24 1953)  diltiazem (CARDIZEM) injection 10 mg (10 mg Intravenous Given 08/18/24 2036)     IMPRESSION /  MDM / ASSESSMENT AND PLAN / ED COURSE  I reviewed the triage vital signs and the nursing notes.   Patient's presentation is most consistent with acute presentation with potential threat to life or bodily function.   Patient comes in with concerns for new A-fib with RVR.  Patient was given a dose of diltiazem.  Workup was done evaluate for Electra abnormalities, CHF, ACS.  No unilateral leg swelling to suggest DVT no history of blood clots.  Chest x-ray does look concerning for edema and I did get a BNP that is elevated I do suspect patient is a little bit fluid overloaded.  She is placed on 2 L.  Holding off on diuresis at this moment given the concerns for some low blood pressures.  Will try to get her rate controlled first.  Her BMP is reassuring CBC without evidence of infection troponin was negative.  Patient was given diltiazem and then started on a diltiazem infusion.  Heart rates have come down.  Discussed with the hospital team for admission.    The patient is on the cardiac monitor to evaluate for evidence of arrhythmia and/or significant heart rate changes.      FINAL CLINICAL IMPRESSION(S) / ED DIAGNOSES   Final diagnoses:  Acute respiratory failure with hypoxia (HCC)  Atrial fibrillation with rapid ventricular response (HCC)  Acute congestive heart failure, unspecified heart failure type (HCC)     Rx / DC Orders   ED Discharge Orders     None        Note:  This document was prepared using Dragon voice recognition software and may include unintentional dictation errors.   Ernest Ronal BRAVO, MD 08/18/24 2234

## 2024-08-18 NOTE — ED Triage Notes (Signed)
 Pt reports multiple times this week she has had episodes of feeling light headed and chest pain. Pt reports pain in chest was worse tonight. Pt denies known cardiac hx.

## 2024-08-18 NOTE — Assessment & Plan Note (Signed)
 Holding metoprolol  due to soft blood pressure with diltiazem infusion

## 2024-08-19 ENCOUNTER — Other Ambulatory Visit: Payer: Self-pay

## 2024-08-19 ENCOUNTER — Observation Stay

## 2024-08-19 ENCOUNTER — Other Ambulatory Visit (HOSPITAL_COMMUNITY): Payer: Self-pay

## 2024-08-19 ENCOUNTER — Telehealth (HOSPITAL_COMMUNITY): Payer: Self-pay

## 2024-08-19 DIAGNOSIS — F02818 Dementia in other diseases classified elsewhere, unspecified severity, with other behavioral disturbance: Secondary | ICD-10-CM | POA: Diagnosis present

## 2024-08-19 DIAGNOSIS — I4891 Unspecified atrial fibrillation: Secondary | ICD-10-CM | POA: Diagnosis present

## 2024-08-19 DIAGNOSIS — L8990 Pressure ulcer of unspecified site, unspecified stage: Secondary | ICD-10-CM | POA: Diagnosis not present

## 2024-08-19 DIAGNOSIS — Z96642 Presence of left artificial hip joint: Secondary | ICD-10-CM | POA: Diagnosis present

## 2024-08-19 DIAGNOSIS — I081 Rheumatic disorders of both mitral and tricuspid valves: Secondary | ICD-10-CM | POA: Diagnosis present

## 2024-08-19 DIAGNOSIS — I493 Ventricular premature depolarization: Secondary | ICD-10-CM | POA: Diagnosis present

## 2024-08-19 DIAGNOSIS — Z8542 Personal history of malignant neoplasm of other parts of uterus: Secondary | ICD-10-CM | POA: Diagnosis not present

## 2024-08-19 DIAGNOSIS — Z952 Presence of prosthetic heart valve: Secondary | ICD-10-CM | POA: Diagnosis not present

## 2024-08-19 DIAGNOSIS — R0609 Other forms of dyspnea: Secondary | ICD-10-CM

## 2024-08-19 DIAGNOSIS — K219 Gastro-esophageal reflux disease without esophagitis: Secondary | ICD-10-CM | POA: Diagnosis present

## 2024-08-19 DIAGNOSIS — G309 Alzheimer's disease, unspecified: Secondary | ICD-10-CM | POA: Diagnosis present

## 2024-08-19 DIAGNOSIS — J9601 Acute respiratory failure with hypoxia: Secondary | ICD-10-CM | POA: Diagnosis present

## 2024-08-19 DIAGNOSIS — I255 Ischemic cardiomyopathy: Secondary | ICD-10-CM | POA: Diagnosis present

## 2024-08-19 DIAGNOSIS — N179 Acute kidney failure, unspecified: Secondary | ICD-10-CM | POA: Diagnosis present

## 2024-08-19 DIAGNOSIS — I480 Paroxysmal atrial fibrillation: Secondary | ICD-10-CM | POA: Diagnosis present

## 2024-08-19 DIAGNOSIS — Z7982 Long term (current) use of aspirin: Secondary | ICD-10-CM | POA: Diagnosis not present

## 2024-08-19 DIAGNOSIS — Z85828 Personal history of other malignant neoplasm of skin: Secondary | ICD-10-CM | POA: Diagnosis not present

## 2024-08-19 DIAGNOSIS — Z7901 Long term (current) use of anticoagulants: Secondary | ICD-10-CM | POA: Diagnosis not present

## 2024-08-19 DIAGNOSIS — G4700 Insomnia, unspecified: Secondary | ICD-10-CM | POA: Diagnosis present

## 2024-08-19 DIAGNOSIS — E785 Hyperlipidemia, unspecified: Secondary | ICD-10-CM | POA: Diagnosis present

## 2024-08-19 DIAGNOSIS — Z803 Family history of malignant neoplasm of breast: Secondary | ICD-10-CM | POA: Diagnosis not present

## 2024-08-19 DIAGNOSIS — I110 Hypertensive heart disease with heart failure: Secondary | ICD-10-CM | POA: Diagnosis present

## 2024-08-19 DIAGNOSIS — I872 Venous insufficiency (chronic) (peripheral): Secondary | ICD-10-CM | POA: Diagnosis present

## 2024-08-19 DIAGNOSIS — I7300 Raynaud's syndrome without gangrene: Secondary | ICD-10-CM | POA: Diagnosis present

## 2024-08-19 DIAGNOSIS — I509 Heart failure, unspecified: Secondary | ICD-10-CM | POA: Diagnosis present

## 2024-08-19 DIAGNOSIS — I5181 Takotsubo syndrome: Secondary | ICD-10-CM | POA: Diagnosis present

## 2024-08-19 LAB — ECHOCARDIOGRAM COMPLETE
AR max vel: 2.37 cm2
AV Peak grad: 2.2 mmHg
Ao pk vel: 0.74 m/s
Area-P 1/2: 6.54 cm2
Height: 62 in
MV M vel: 3.65 m/s
MV Peak grad: 53.1 mmHg
S' Lateral: 2.9 cm
Weight: 2160 [oz_av]

## 2024-08-19 LAB — D-DIMER, QUANTITATIVE: D-Dimer, Quant: 1.9 ug{FEU}/mL — ABNORMAL HIGH (ref 0.00–0.50)

## 2024-08-19 MED ORDER — METOPROLOL TARTRATE 25 MG PO TABS
25.0000 mg | ORAL_TABLET | Freq: Two times a day (BID) | ORAL | Status: DC
  Administered 2024-08-19: 25 mg via ORAL
  Filled 2024-08-19: qty 1

## 2024-08-19 MED ORDER — DIGOXIN 0.25 MG/ML IJ SOLN
0.2500 mg | Freq: Once | INTRAMUSCULAR | Status: AC
  Administered 2024-08-19: 0.25 mg via INTRAVENOUS
  Filled 2024-08-19: qty 2

## 2024-08-19 MED ORDER — FUROSEMIDE 10 MG/ML IJ SOLN
40.0000 mg | Freq: Once | INTRAMUSCULAR | Status: AC
  Administered 2024-08-19: 40 mg via INTRAVENOUS
  Filled 2024-08-19: qty 4

## 2024-08-19 MED ORDER — APIXABAN 5 MG PO TABS
5.0000 mg | ORAL_TABLET | Freq: Two times a day (BID) | ORAL | Status: DC
  Administered 2024-08-19 – 2024-08-21 (×5): 5 mg via ORAL
  Filled 2024-08-19 (×5): qty 1

## 2024-08-19 MED ORDER — AMIODARONE HCL IN DEXTROSE 360-4.14 MG/200ML-% IV SOLN
60.0000 mg/h | INTRAVENOUS | Status: AC
  Administered 2024-08-19 (×2): 60 mg/h via INTRAVENOUS
  Filled 2024-08-19 (×2): qty 200

## 2024-08-19 MED ORDER — SODIUM CHLORIDE 0.9 % IV BOLUS
250.0000 mL | Freq: Once | INTRAVENOUS | Status: AC
  Administered 2024-08-19: 250 mL via INTRAVENOUS

## 2024-08-19 MED ORDER — PAROXETINE HCL 30 MG PO TABS
30.0000 mg | ORAL_TABLET | Freq: Every day | ORAL | Status: DC
  Administered 2024-08-19 – 2024-08-20 (×2): 30 mg via ORAL
  Filled 2024-08-19 (×3): qty 1

## 2024-08-19 MED ORDER — IOHEXOL 350 MG/ML SOLN
75.0000 mL | Freq: Once | INTRAVENOUS | Status: AC | PRN
  Administered 2024-08-19: 75 mL via INTRAVENOUS

## 2024-08-19 MED ORDER — AMIODARONE LOAD VIA INFUSION
150.0000 mg | Freq: Once | INTRAVENOUS | Status: AC
  Administered 2024-08-19: 150 mg via INTRAVENOUS
  Filled 2024-08-19: qty 83.34

## 2024-08-19 MED ORDER — MEMANTINE HCL 10 MG PO TABS
10.0000 mg | ORAL_TABLET | Freq: Two times a day (BID) | ORAL | Status: DC
  Administered 2024-08-19 – 2024-08-21 (×5): 10 mg via ORAL
  Filled 2024-08-19 (×3): qty 2
  Filled 2024-08-19 (×2): qty 1

## 2024-08-19 MED ORDER — AMIODARONE HCL IN DEXTROSE 360-4.14 MG/200ML-% IV SOLN
30.0000 mg/h | INTRAVENOUS | Status: DC
  Administered 2024-08-19: 30 mg/h via INTRAVENOUS

## 2024-08-19 MED ORDER — ENOXAPARIN SODIUM 60 MG/0.6ML IJ SOSY: 60.0000 mg | PREFILLED_SYRINGE | Freq: Two times a day (BID) | INTRAMUSCULAR | Status: DC

## 2024-08-19 NOTE — Progress Notes (Signed)
 Echocardiogram 2D Echocardiogram has been performed.  Whitney Pace 08/19/2024, 9:13 AM

## 2024-08-19 NOTE — Telephone Encounter (Signed)
 Pharmacy Patient Advocate Encounter  Insurance verification completed.    The patient is insured through [facility]. Patient has Medicare and is not eligible for a copay card, but may be able to apply for patient assistance or Medicare RX Payment Plan (Patient Must reach out to their plan, if eligible for payment plan), if available.    Ran test claim for Eliquis 5mg  and the current 30 day co-pay is $45.00   This test claim was processed through [employer]- copay amounts may vary at other pharmacies due to [employer], or as the patient moves through the different stages of their insurance plan.

## 2024-08-19 NOTE — ED Notes (Signed)
 Patient converted out of A-fib for 2 minutes. RN was unable to capture EKG before patient went back into A-fib rhythm.

## 2024-08-19 NOTE — ED Notes (Signed)
 Dr. Para March at bedside

## 2024-08-19 NOTE — Assessment & Plan Note (Addendum)
 CHF versus acute PE versus both Patient reports episodic dyspnea on exertion associated with chest discomfort and lightheadedness D-dimer elevated to 1.9 Getting CTA chest Will empirically treat with weight-based Lovenox  pending result

## 2024-08-19 NOTE — ED Notes (Signed)
 RN informed Cardiology of patient's response to the Cardizem. Patients HR still elevated with soft Blood Pressures. PA discussed switching patient to another medication.

## 2024-08-19 NOTE — Progress Notes (Signed)
 PHARMACY - ANTICOAGULATION CONSULT NOTE  Pharmacy Consult for Enoxaparin  Tx Indication: pulmonary embolus: empiric treatment pending CTA chest  Allergies  Allergen Reactions   Atropine Other (See Comments)    Increased blood pressure   Escitalopram Oxalate     Other reaction(s): Other (See Comments) Caused her to feel jittery   Neomycin-Bacitracin Zn-Polymyx     Other reaction(s): Unknown Hypertension    Promethazine Other (See Comments)    anxiety   Rivastigmine Other (See Comments)    Other reaction(s): Vomiting   Rivastigmine Tartrate Nausea Only   Tramadol Nausea And Vomiting    Patient also states flushing and throat was hoarse   Venlafaxine     Other reaction(s): Hallucination    Patient Measurements: Height: 5' 2 (157.5 cm) Weight: 61.2 kg (135 lb) IBW/kg (Calculated) : 50.1 HEPARIN DW (KG): 61.2  Vital Signs: Temp: 98.4 F (36.9 C) (10/15 0541) Temp Source: Oral (10/15 0541) BP: 110/91 (10/15 0541) Pulse Rate: 137 (10/15 0541)  Labs: Recent Labs    08/18/24 1943 08/18/24 2217  HGB 14.0  --   HCT 43.3  --   PLT 245  --   CREATININE 1.02*  --   TROPONINIHS 10 16    Estimated Creatinine Clearance: 37.8 mL/min (A) (by C-G formula based on SCr of 1.02 mg/dL (H)).   Medical History: Past Medical History:  Diagnosis Date   Anxiety    a.) on BZO (alprazolam ) PRN   Arthritis    Childhood asthma    Diastolic dysfunction    a.) TTE 06/04/2018: EF 50%, mild RAE, trivial AR/PR, mild MR/TR, G2DD   GERD (gastroesophageal reflux disease)    Glaucoma    Heart murmur    History of cardiac cath 12/16/2006   a.) LHC 12/16/2006: norm coronaries with no obstructive CAD; definite MVP   Hyperlipidemia    Hypertension    Imbalance    Lymphedema of left lower extremity    Major depression    Memory loss    a.) on memantine   MVP (mitral valve prolapse)    a.) s/p ring valvuloplasty at [facility] 12/17/2006   Numbness in feet    PVC (premature ventricular  contraction)    Raynaud disease    S/P MVR (mitral valve replacement) 12/17/2006   a.) ring valvuloplasty via RIGHT thoracotomy approach; 40 mm Seguin ring   Skin cancer    Spigelian hernia    Uterine cancer [facility])     Assessment: Pt is 78 year old female presenting to ED c/o multiple episodes of light headedness and chest pain, orders empiric enoxaparin  tx while being evaluated for possible PE.  Goal of Therapy:  Anti-Xa level 0.6-1 units/ml 4hrs after LMWH dose given Monitor platelets by anticoagulation protocol: Yes   Plan:  Enoxaparin  1 mg/kg (60 mg) q12hr Recheck CBC q72hr Will check LMWH level after steady-state level is reached if appropriate.  Rankin CANDIE Dills, PharmD, [facility] 08/19/2024 6:41 AM

## 2024-08-19 NOTE — ED Notes (Signed)
 Pts HR noted to be maintaining sinus brady and BP noted to be around 80/60's. Message sent to cardiologist inquiring about d/c amiodarone infusion. Verbal order given to stop amiodarone drip.

## 2024-08-19 NOTE — ED Notes (Signed)
 Notified MD and cardiology PA that patient converted out of A-fib rhythm and now is in a sinus brady rhythmn with a HR in the 50s. EKG is in chart.

## 2024-08-19 NOTE — Consult Note (Addendum)
 [facility] CLINIC CARDIOLOGY CONSULT NOTE       Patient ID: Whitney Pace MRN: [id number] DOB/AGE: 1946-04-13 78 y.o.  Admit date: 08/18/2024 Referring Physician Dr. Delayne Solian Primary Physician Cleotilde Oneil FALCON, MD  Primary Cardiologist Dr. Ammon Reason for Consultation A-fib RVR  HPI: Whitney Pace is a 78 y.o. female  with a past medical history of hypertension, hyperlipidemia, questionable atrial fibrillation, mitral valve prolapse s/p repair 15 years ago, PVCs, dementia who presented to the ED on 08/18/2024 for chest discomfort, weakness.  Found to be in atrial fibrillation RVR in the ED.  Cardiology was consulted for further evaluation.   Has been reports that patient had not been feeling well for a few days and was having difficulty getting around and was complaining of some chest discomfort so she was brought to the ED for evaluation.  Workup in the ED notable for creatinine 1.02, potassium 3.9, hemoglobin 14.0, WBC 7.1. Troponins 10 > 16, BNP 561. EKG in the ED atrial fibrillation RVR rate 142 bpm with PVCs.  CXR in the ED with patchy bilateral infiltrates, CTA chest without evidence of PE, moderate right pleural effusion and mild to moderate left pleural effusion.  Started on IV diltiazem in the ED but BP limited uptitration.  At the time my evaluation this morning patient is resting comfortably in ED stretcher with husband present at bedside.  Husband provides portions of the history due to her baseline dementia.  They report that about 1 to 2 days ago she started saying that she did not feel good.  Also endorsed discomfort in her chest and difficulty walking.  States that she would feel her heart racing when she would get up and move around.  This morning she denies any chest pain, shortness of breath, palpitations but heart rate still remains elevated.  Review of systems complete and found to be negative unless listed above    Past Medical History:  Diagnosis Date   Anxiety     a.) on BZO (alprazolam ) PRN   Arthritis    Childhood asthma    Diastolic dysfunction    a.) TTE 06/04/2018: EF 50%, mild RAE, trivial AR/PR, mild MR/TR, G2DD   GERD (gastroesophageal reflux disease)    Glaucoma    Heart murmur    History of cardiac cath 12/16/2006   a.) LHC 12/16/2006: norm coronaries with no obstructive CAD; definite MVP   Hyperlipidemia    Hypertension    Imbalance    Lymphedema of left lower extremity    Major depression    Memory loss    a.) on memantine   MVP (mitral valve prolapse)    a.) s/p ring valvuloplasty at [facility] 12/17/2006   Numbness in feet    PVC (premature ventricular contraction)    Raynaud disease    S/P MVR (mitral valve replacement) 12/17/2006   a.) ring valvuloplasty via RIGHT thoracotomy approach; 40 mm Seguin ring   Skin cancer    Spigelian hernia    Uterine cancer (HCC)     Past Surgical History:  Procedure Laterality Date   BREAST CYST ASPIRATION Bilateral    negative   CATARACT EXTRACTION W/ INTRAOCULAR LENS IMPLANT Bilateral    EYE SURGERY Left    repair detatched retina x 2, removal of bubble' AND SCAR TISSUE IN EYE   EYE SURGERY Left    laser treatment left eye x 3   LEFT HEART CATH AND CORONARY ANGIOGRAPHY Left 12/16/2006   Procedure: LEFT HEART CATH AND CORONARY  ANGIOGRAPHY; Location: [facility]; Surgeon: Rodgers Sink, MD   MITRAL VALVE REPAIR Right 12/17/2006   Procedure: RIGHT THORACOTOMY AND RING VALVULOPLASTY (MITRAL); Location: [facility]   TOTAL ABDOMINAL HYSTERECTOMY W/ BILATERAL SALPINGOOPHORECTOMY     TOTAL HIP ARTHROPLASTY Left 10/15/2016   Procedure: LEFT TOTAL HIP ARTHROPLASTY ANTERIOR APPROACH;  Surgeon: Donnice Car, MD;  Location: [facility] ORS;  Service: Orthopedics;  Laterality: Left;   XI ROBOTIC ASSISTED VENTRAL HERNIA N/A 08/15/2022   Procedure: XI ROBOTIC ASSISTED VENTRAL HERNIA;  Surgeon: Rodolph Romano, MD;  Location: [facility] ORS;  Service: General;  Laterality: N/A;    (Not in a hospital admission)  Social History    Socioeconomic History   Marital status: Married    Spouse name: Ubaldo   Number of children: 3   Years of education: Not on file   Highest education level: Not on file  Occupational History   Not on file  Tobacco Use   Smoking status: Never   Smokeless tobacco: Never  Vaping Use   Vaping status: Never Used  Substance and Sexual Activity   Alcohol  use: No    Comment: wine rarely   Drug use: No   Sexual activity: Not on file  Other Topics Concern   Not on file  Social History Narrative   Not on file   Social Drivers of Health   Financial Resource Strain: Low Risk  (06/23/2024)   Received from [facility]   Overall Financial Resource Strain (CARDIA)    Difficulty of Paying Living Expenses: Not hard at all  Food Insecurity: No Food Insecurity (06/23/2024)   Received from [facility]   Hunger Vital Sign    Within the past 12 months, you worried that your food would run out before you got the money to buy more.: Never true    Within the past 12 months, the food you bought just didn't last and you didn't have money to get more.: Never true  Transportation Needs: No Transportation Needs (06/23/2024)   Received from [facility] - Transportation    In the past 12 months, has lack of transportation kept you from medical appointments or from getting medications?: No    Lack of Transportation (Non-Medical): No  Physical Activity: Not on file  Stress: Not on file  Social Connections: Not on file  Intimate Partner Violence: Not on file    Family History  Problem Relation Age of Onset   Breast cancer Paternal Aunt        two aunts in their 3's     Vitals:   08/19/24 0719 08/19/24 0800 08/19/24 0810 08/19/24 0815  BP:  111/71 101/64   Pulse: 72 (!) 101 (!) 102   Resp: 19 20 (!) 22 17  Temp:      TempSrc:      SpO2: 92% 94% 92%   Weight:      Height:        PHYSICAL EXAM General: Chronically ill-appearing  elderly female, well nourished, in no acute distress. HEENT: Normocephalic and atraumatic. Neck: No JVD.  Lungs: Normal respiratory effort on room air. Clear bilaterally to auscultation. No wheezes, crackles, rhonchi.  Heart: Irregularly irregular, elevated rate. Normal S1 and S2 without gallops or murmurs.  Abdomen: Non-distended appearing.  Msk: Normal strength and tone for age. Extremities: Warm and well perfused. No clubbing, cyanosis.  Trace edema.  Neuro: Alert and oriented X 3. Psych: Answers questions appropriately.   Labs: Basic Metabolic Panel: Recent  Labs    08/18/24 1943 08/18/24 2056  NA 138  --   K 3.9  --   CL 105  --   CO2 20*  --   GLUCOSE 165*  --   BUN 22  --   CREATININE 1.02*  --   CALCIUM  9.0  --   MG  --  2.5*   Liver Function Tests: Recent Labs    08/18/24 2056  AST 42*  ALT 31  ALKPHOS 77  BILITOT 0.7  PROT 6.9  ALBUMIN 4.0   No results for input(s): LIPASE, AMYLASE in the last 72 hours. CBC: Recent Labs    08/18/24 1943  WBC 7.1  HGB 14.0  HCT 43.3  MCV 93.9  PLT 245   Cardiac Enzymes: Recent Labs    08/18/24 1943 08/18/24 2217  TROPONINIHS 10 16   BNP: Recent Labs    08/18/24 2056  BNP 561.4*   D-Dimer: Recent Labs    08/19/24 0258  DDIMER 1.90*   Hemoglobin A1C: No results for input(s): HGBA1C in the last 72 hours. Fasting Lipid Panel: No results for input(s): CHOL, HDL, LDLCALC, TRIG, CHOLHDL, LDLDIRECT in the last 72 hours. Thyroid  Function Tests: Recent Labs    08/18/24 2056  TSH 1.857   Anemia Panel: No results for input(s): VITAMINB12, FOLATE, FERRITIN, TIBC, IRON, RETICCTPCT in the last 72 hours.   Radiology: CT Angio Chest Pulmonary Embolism (PE) W or WO Contrast Result Date: 08/19/2024 EXAM: CTA CHEST 08/19/2024 06:31:42 AM TECHNIQUE: CTA of the chest was performed without and with the administration of 75 mL of iohexol (OMNIPAQUE) 350 MG/ML injection. Multiplanar  reformatted images are provided for review. MIP images are provided for review. Automated exposure control, iterative reconstruction, and/or weight based adjustment of the mA/kV was utilized to reduce the radiation dose to as low as reasonably achievable. COMPARISON: CT of the head dated 12/24/2006. CLINICAL HISTORY: Pulmonary embolism (PE) suspected, low to intermediate prob, neg D-dimer. FINDINGS: PULMONARY ARTERIES: Pulmonary arteries are adequately opacified for evaluation. There are no discrete intraluminal filling defects present within the pulmonary arteries. There is suboptimal opacification of the subsegmental arteries within the lung bases bilaterally, likely secondary to streaky areas of peribronchovascular opacification/consolidation. Main pulmonary artery is normal in caliber. MEDIASTINUM: The heart is moderately enlarged. There is mild-to-moderate atheromatous disease within the thoracic aorta. There is intrathoracic goiter present on the left. LYMPH NODES: There is some shotty mediastinal lymphadenopathy. No hilar or axillary lymphadenopathy. LUNGS AND PLEURA: There are hazy and patchy peribronchovascular opacities also present within the left upper lobe and right middle lobe. There is a moderate right-sided pleural effusion and a mild-to-moderate left effusion. No pneumothorax. UPPER ABDOMEN: Limited images of the upper abdomen are unremarkable. SOFT TISSUES AND BONES: No acute bone or soft tissue abnormality. IMPRESSION: 1. No evidence of pulmonary embolism, although evaluation is limited by suboptimal opacification of the subsegmental arteries within the lung bases bilaterally. 2. Moderate right-sided pleural effusion and mild-to-moderate left effusion. 3. Moderately enlarged heart. 4. 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 Electronically signed by: Evalene Coho MD 08/19/2024 06:58 AM EDT RP Workstation: [id number]   DG Chest Port 1 View Result Date: 08/18/2024 EXAM: 1 VIEW XRAY OF THE CHEST  08/18/2024 08:14:00 PM COMPARISON: Chest x-ray 02/11/2018. CLINICAL HISTORY: Chest pain. The patient states multiple times this week she has had episodes of feeling light headed and chest pain. Pt reports pain in chest was worse tonight. Hx of HTN and mitral valve repair. FINDINGS: LUNGS AND PLEURA: There  are patchy bilateral lower lung infiltrates. No pulmonary edema. No pleural effusion. No pneumothorax. HEART AND MEDIASTINUM: Heart is mildly enlarged. BONES AND SOFT TISSUES: No acute osseous abnormality. IMPRESSION: 1. Patchy bilateral lower lung infiltrates. 2. Mild cardiomegaly. Electronically signed by: Greig Pique MD 08/18/2024 08:25 PM EDT RP Workstation: [id number]    ECHO pending  TELEMETRY (personally reviewed): Atrial fibrillation rate 120-130s  EKG (personally reviewed): atrial fibrillation RVR rate 142 bpm with PVCs  Data reviewed by me 08/19/2024: last 24h vitals tele labs imaging I/O ED provider note, admission H&P  Principal Problem:   Paroxysmal atrial fibrillation with rapid ventricular response (HCC) Active Problems:   Essential hypertension   Chronic venous insufficiency   MVP (mitral valve prolapse) s/p repair 2008   Alzheimer's dementia without behavioral disturbance (HCC)   Possible acute CHF (HCC)   Dyspnea on exertion    ASSESSMENT AND PLAN:  Whitney Pace is a 78 y.o. female  with a past medical history of hypertension, hyperlipidemia, questionable atrial fibrillation, mitral valve prolapse s/p repair 15 years ago, PVCs, dementia who presented to the ED on 08/18/2024 for chest discomfort, weakness.  Found to be in atrial fibrillation RVR in the ED.  Cardiology was consulted for further evaluation.   # Atrial fibrillation RVR # ?Hx atrial fibrillation # Hypertension # Frequent PVCs # Mitral valve prolapse s/p repair Patient brought to the ED after not feeling well for 1 to 2 days, found to be in atrial fibrillation RVR on EKG.  Troponins normal x 2.  Started on  IV diltiazem in the ED but uptitration was limited by borderline blood pressure. - Echo ordered. - Will start IV amiodarone bolus and infusion. - Stop IV diltiazem.  Will reassess blood pressure later today to determine if any p.o. AV nodal blockers can be initiated. - Start Eliquis 5 mg twice daily for stroke risk reduction. - Will give 1 dose IV lasix 40 mg daily. - Continue aspirin  81 mg daily.   This patient's plan of care was discussed and created with Dr. Florencio and he is in agreement.  Signed: Danita Bloch, PA-C  08/19/2024, 9:22 AM [facility] Cardiology

## 2024-08-19 NOTE — Progress Notes (Signed)
 PROGRESS NOTE    Whitney Pace  FMW:[id number] DOB: 1945-12-14 DOA: 08/18/2024 PCP: Cleotilde Oneil FALCON, MD     Brief Narrative:    Whitney Pace is a 78 y.o. female with medical history significant for HTN, HLD, mitral valve repair 2008, Alzheimer's dementia, PVCs versus paroxysmal [phone], PACs on Holter), not on [facility] (last seen by cardiology 07/14/2024), presenting with a several day history of i multiple episodes of sudden onset shortness of breath associated with chest discomfort and lightheadedness that will just stop her in her tracks..  Denies lower extremity pain or swelling.  Found to be in A-fib with RVR on arrival. In the ED in rapid A-fib to 142 with otherwise normal vitals Labs notable for troponin 10, BNP 561 Potassium and magnesium  normal Creatinine 1.02 (0.9, 01/2024).  BMP otherwise unremarkable CBC normal Hepatic function panel notable only for slightly elevated AST of 42. EKG with A-fib at 142 Chest x-ray showing patchy bilateral lower lung infiltrates and mild cardiomegaly, no pulmonary edema or pleural effusion Patient was given a diltiazem bolus started on an infusion.   Assessment & Plan:   Principal Problem:   Paroxysmal atrial fibrillation with rapid ventricular response (HCC) Active Problems:   Dyspnea on exertion   Possible acute CHF (HCC)   Essential hypertension   Chronic venous insufficiency   MVP (mitral valve prolapse) s/p repair 2008   Alzheimer's dementia without behavioral disturbance (HCC)  # A-fib with rvr Hemodynamically stable but soft pressures - cardiology following - on amio - tte pending - continue lasix - apisaban started  # CHF exacerbation Likely mediated by a-fib - lasix  - tte  # Pleural effusions Likely 2/2 chf from a fib. Satting normally, breathing comfortably, no s/s infection - diuresis - consider thora if fails to respond to diuresis  # Dementia Calm - home galantamine on hold given new cardiac dysfunction - continue  home memantine  # HTN Soft bps - home amlodipine on hold  # Insomnia - hold amitryptyline given cardiac dysfunction   DVT prophylaxis: apixaban Code Status: full Family Communication: husband at bedside 10/15  Level of care: Progressive Status is: Observation    Consultants:  cardiology  Procedures: None thus far  Antimicrobials:  none    Subjective: Reports feeling fine  Objective: Vitals:   08/19/24 0719 08/19/24 0800 08/19/24 0810 08/19/24 0815  BP:  111/71 101/64   Pulse: 72 (!) 101 (!) 102   Resp: 19 20 (!) 22 17  Temp:      TempSrc:      SpO2: 92% 94% 92%   Weight:      Height:       No intake or output data in the 24 hours ending 08/19/24 0950 Filed Weights   08/18/24 1936  Weight: 61.2 kg    Examination:  General exam: Appears calm and comfortable  Respiratory system: Clear to auscultation except decreased sounds and rales at bases Cardiovascular system: S1 & S2 heard, distant sounds, tachy, irreg Gastrointestinal system: Abdomen is nondistended, soft and nontender.  Central nervous system: Alert and oriented to self, moving all 4 Extremities: Symmetric 5 x 5 power. Trace LE edema Skin: No rashes, lesions or ulcers Psychiatry: confused, calm    Data Reviewed: I have personally reviewed following labs and imaging studies  CBC: Recent Labs  Lab 08/18/24 1943  WBC 7.1  HGB 14.0  HCT 43.3  MCV 93.9  PLT 245   Basic Metabolic Panel: Recent Labs  Lab 08/18/24 1943  08/18/24 2056  NA 138  --   K 3.9  --   CL 105  --   CO2 20*  --   GLUCOSE 165*  --   BUN 22  --   CREATININE 1.02*  --   CALCIUM  9.0  --   MG  --  2.5*   GFR: Estimated Creatinine Clearance: 37.8 mL/min (A) (by C-G formula based on SCr of 1.02 mg/dL (H)). Liver Function Tests: Recent Labs  Lab 08/18/24 2056  AST 42*  ALT 31  ALKPHOS 77  BILITOT 0.7  PROT 6.9  ALBUMIN 4.0   No results for input(s): LIPASE, AMYLASE in the last 168 hours. No results  for input(s): AMMONIA in the last 168 hours. Coagulation Profile: No results for input(s): INR, PROTIME in the last 168 hours. Cardiac Enzymes: No results for input(s): CKTOTAL, CKMB, CKMBINDEX, TROPONINI in the last 168 hours. BNP (last 3 results) No results for input(s): PROBNP in the last 8760 hours. HbA1C: No results for input(s): HGBA1C in the last 72 hours. CBG: No results for input(s): GLUCAP in the last 168 hours. Lipid Profile: No results for input(s): CHOL, HDL, LDLCALC, TRIG, CHOLHDL, LDLDIRECT in the last 72 hours. Thyroid  Function Tests: Recent Labs    08/18/24 2056  TSH 1.857   Anemia Panel: No results for input(s): VITAMINB12, FOLATE, FERRITIN, TIBC, IRON, RETICCTPCT in the last 72 hours. Urine analysis:    Component Value Date/Time   COLORURINE YELLOW (A) 10/23/2019 2234   APPEARANCEUR CLEAR (A) 10/23/2019 2234   APPEARANCEUR Clear 11/19/2014 2159   LABSPEC 1.012 10/23/2019 2234   LABSPEC 1.013 11/19/2014 2159   PHURINE 6.0 10/23/2019 2234   GLUCOSEU NEGATIVE 10/23/2019 2234   GLUCOSEU Negative 11/19/2014 2159   HGBUR NEGATIVE 10/23/2019 2234   BILIRUBINUR NEGATIVE 10/23/2019 2234   BILIRUBINUR Negative 11/19/2014 2159   KETONESUR 5 (A) 10/23/2019 2234   PROTEINUR NEGATIVE 10/23/2019 2234   NITRITE NEGATIVE 10/23/2019 2234   LEUKOCYTESUR LARGE (A) 10/23/2019 2234   LEUKOCYTESUR 1+ 11/19/2014 2159   Sepsis Labs: @LABRCNTIP (procalcitonin:4,lacticidven:4)  )No results found for this or any previous visit (from the past 240 hours).       Radiology Studies: CT Angio Chest Pulmonary Embolism (PE) W or WO Contrast Result Date: 08/19/2024 EXAM: CTA CHEST 08/19/2024 06:31:42 AM TECHNIQUE: CTA of the chest was performed without and with the administration of 75 mL of iohexol (OMNIPAQUE) 350 MG/ML injection. Multiplanar reformatted images are provided for review. MIP images are provided for review. Automated  exposure control, iterative reconstruction, and/or weight based adjustment of the mA/kV was utilized to reduce the radiation dose to as low as reasonably achievable. COMPARISON: CT of the head dated 12/24/2006. CLINICAL HISTORY: Pulmonary embolism (PE) suspected, low to intermediate prob, neg D-dimer. FINDINGS: PULMONARY ARTERIES: Pulmonary arteries are adequately opacified for evaluation. There are no discrete intraluminal filling defects present within the pulmonary arteries. There is suboptimal opacification of the subsegmental arteries within the lung bases bilaterally, likely secondary to streaky areas of peribronchovascular opacification/consolidation. Main pulmonary artery is normal in caliber. MEDIASTINUM: The heart is moderately enlarged. There is mild-to-moderate atheromatous disease within the thoracic aorta. There is intrathoracic goiter present on the left. LYMPH NODES: There is some shotty mediastinal lymphadenopathy. No hilar or axillary lymphadenopathy. LUNGS AND PLEURA: There are hazy and patchy peribronchovascular opacities also present within the left upper lobe and right middle lobe. There is a moderate right-sided pleural effusion and a mild-to-moderate left effusion. No pneumothorax. UPPER ABDOMEN: Limited images of the  upper abdomen are unremarkable. SOFT TISSUES AND BONES: No acute bone or soft tissue abnormality. IMPRESSION: 1. No evidence of pulmonary embolism, although evaluation is limited by suboptimal opacification of the subsegmental arteries within the lung bases bilaterally. 2. Moderate right-sided pleural effusion and mild-to-moderate left effusion. 3. Moderately enlarged heart. 4. 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 Electronically signed by: Evalene Coho MD 08/19/2024 06:58 AM EDT RP Workstation: [id number]   DG Chest Port 1 View Result Date: 08/18/2024 EXAM: 1 VIEW XRAY OF THE CHEST 08/18/2024 08:14:00 PM COMPARISON: Chest x-ray 02/11/2018. CLINICAL HISTORY: Chest pain. The  patient states multiple times this week she has had episodes of feeling light headed and chest pain. Pt reports pain in chest was worse tonight. Hx of HTN and mitral valve repair. FINDINGS: LUNGS AND PLEURA: There are patchy bilateral lower lung infiltrates. No pulmonary edema. No pleural effusion. No pneumothorax. HEART AND MEDIASTINUM: Heart is mildly enlarged. BONES AND SOFT TISSUES: No acute osseous abnormality. IMPRESSION: 1. Patchy bilateral lower lung infiltrates. 2. Mild cardiomegaly. Electronically signed by: Greig Pique MD 08/18/2024 08:25 PM EDT RP Workstation: [id number]        Scheduled Meds:  apixaban  5 mg Oral BID   aspirin  EC  81 mg Oral QHS   furosemide  40 mg Intravenous Once   pravastatin   20 mg Oral q1800   Continuous Infusions:  amiodarone 60 mg/hr (08/19/24 0810)   Followed by   amiodarone     diltiazem (CARDIZEM) infusion Stopped (08/19/24 0806)     LOS: 0 days     Devaughn KATHEE Ban, MD [facility]   If 7PM-7AM, please contact night-coverage [email or web address] Password [email or web address] 08/19/2024, 9:50 AM

## 2024-08-19 NOTE — ED Notes (Signed)
 Patient was found on the toilet after husband had stepped out of room to grab RN to notify that patient needed to go to the bathroom. Call button was in place. Patient had pulled out IV by accident trying to get to the toilet. Patient was cleaned up. Re oriented back to the bed, Fall Risk Bundle in place. Bed alarm On. Two new IV were started and Amiodarone gtt re-started. Warm Blankets provided. Education provided to patient and husband. Patient confused to situation and place at this time. Husband at bedside. Wouk, MD walked into room.

## 2024-08-20 ENCOUNTER — Other Ambulatory Visit: Payer: Self-pay

## 2024-08-20 ENCOUNTER — Telehealth (HOSPITAL_COMMUNITY): Payer: Self-pay

## 2024-08-20 ENCOUNTER — Other Ambulatory Visit (HOSPITAL_COMMUNITY): Payer: Self-pay

## 2024-08-20 DIAGNOSIS — I480 Paroxysmal atrial fibrillation: Secondary | ICD-10-CM

## 2024-08-20 LAB — BASIC METABOLIC PANEL WITH GFR
Anion gap: 13 (ref 5–15)
BUN: 24 mg/dL — ABNORMAL HIGH (ref 8–23)
CO2: 20 mmol/L — ABNORMAL LOW (ref 22–32)
Calcium: 8.9 mg/dL (ref 8.9–10.3)
Chloride: 101 mmol/L (ref 98–111)
Creatinine, Ser: 1.63 mg/dL — ABNORMAL HIGH (ref 0.44–1.00)
GFR, Estimated: 32 mL/min — ABNORMAL LOW (ref >60)
Glucose, Bld: 132 mg/dL — ABNORMAL HIGH (ref 70–99)
Potassium: 4.2 mmol/L (ref 3.5–5.1)
Sodium: 134 mmol/L — ABNORMAL LOW (ref 135–145)

## 2024-08-20 LAB — CBC
HCT: 40 % (ref 36.0–46.0)
Hemoglobin: 13.1 g/dL (ref 12.0–15.0)
MCH: 30.6 pg (ref 26.0–34.0)
MCHC: 32.8 g/dL (ref 30.0–36.0)
MCV: 93.5 fL (ref 80.0–100.0)
Platelets: 241 K/uL (ref 150–400)
RBC: 4.28 MIL/uL (ref 3.87–5.11)
RDW: 13.7 % (ref 11.5–15.5)
WBC: 7.6 K/uL (ref 4.0–10.5)
nRBC: 0 % (ref 0.0–0.2)

## 2024-08-20 MED ORDER — METOPROLOL SUCCINATE ER 25 MG PO TB24
25.0000 mg | ORAL_TABLET | Freq: Every day | ORAL | Status: DC
  Administered 2024-08-20 – 2024-08-21 (×2): 25 mg via ORAL
  Filled 2024-08-20 (×2): qty 1

## 2024-08-20 NOTE — ED Notes (Signed)
 Pt readjusted in bed. Pt noted to not have urinated since purewick catheter placed. Pt was ambulating up to bedside commode throughout day. Pt given ice water . Daughter remains at bedside. Pt denies any pain.

## 2024-08-20 NOTE — Telephone Encounter (Signed)
 Pharmacy Patient Advocate Encounter  Insurance verification completed.    The patient is insured through [employer].     Ran test claim for Jardiance and the current 30 day co-pay is $45.00.  Ran test claim for Doreen and the current 30 day co-pay is $45.00.  This test claim was processed through [facility]- copay amounts may vary at other pharmacies due to pharmacy/plan contracts, or as the patient moves through the different stages of their insurance plan.

## 2024-08-20 NOTE — Progress Notes (Addendum)
 [facility] CLINIC CARDIOLOGY PROGRESS NOTE       Patient ID: Whitney Pace MRN: [id number] DOB/AGE: [birth year] 78 y.o.  Admit date: 08/18/2024 Referring Physician Dr. Delayne Solian Primary Physician Whitney Oneil FALCON, MD  Primary Cardiologist Dr. Ammon Reason for Consultation A-fib RVR  HPI: Whitney Pace is a 78 y.o. female  with a past medical history of hypertension, hyperlipidemia, questionable atrial fibrillation, mitral valve prolapse s/p repair 15 years ago, PVCs, dementia who presented to the ED on 08/18/2024 for chest discomfort, weakness.  Found to be in atrial fibrillation RVR in the ED.  Cardiology was consulted for further evaluation.   Interval history: -Patient seen and examined this AM, resting in bed with husband at bedside.  -Alert this AM but confused, husband reports worse than her baseline.  -HR improved today, converted to NSR yesterday evening.  -BP stable.  -Echo results reviewed with patient and husband.   Review of systems complete and found to be negative unless listed above    Past Medical History:  Diagnosis Date   Anxiety    a.) on BZO (alprazolam ) PRN   Arthritis    Childhood asthma    Diastolic dysfunction    a.) TTE 06/04/2018: EF 50%, mild RAE, trivial AR/PR, mild MR/TR, G2DD   GERD (gastroesophageal reflux disease)    Glaucoma    Heart murmur    History of cardiac cath 12/16/2006   a.) LHC 12/16/2006: norm coronaries with no obstructive CAD; definite MVP   Hyperlipidemia    Hypertension    Imbalance    Lymphedema of left lower extremity    Major depression    Memory loss    a.) on memantine   MVP (mitral valve prolapse)    a.) s/p ring valvuloplasty at [facility] 12/17/2006   Numbness in feet    PVC (premature ventricular contraction)    Raynaud disease    S/P MVR (mitral valve replacement) 12/17/2006   a.) ring valvuloplasty via RIGHT thoracotomy approach; 40 mm Seguin ring   Skin cancer    Spigelian hernia    Uterine cancer (HCC)      Past Surgical History:  Procedure Laterality Date   BREAST CYST ASPIRATION Bilateral    negative   CATARACT EXTRACTION W/ INTRAOCULAR LENS IMPLANT Bilateral    EYE SURGERY Left    repair detatched retina x 2, removal of bubble' AND SCAR TISSUE IN EYE   EYE SURGERY Left    laser treatment left eye x 3   LEFT HEART CATH AND CORONARY ANGIOGRAPHY Left 12/16/2006   Procedure: LEFT HEART CATH AND CORONARY ANGIOGRAPHY; Location: [facility]; Surgeon: Rodgers Sink, MD   MITRAL VALVE REPAIR Right 12/17/2006   Procedure: RIGHT THORACOTOMY AND RING VALVULOPLASTY (MITRAL); Location: [facility]   TOTAL ABDOMINAL HYSTERECTOMY W/ BILATERAL SALPINGOOPHORECTOMY     TOTAL HIP ARTHROPLASTY Left 10/15/2016   Procedure: LEFT TOTAL HIP ARTHROPLASTY ANTERIOR APPROACH;  Surgeon: Donnice Car, MD;  Location: [facility];  Service: Orthopedics;  Laterality: Left;   XI ROBOTIC ASSISTED VENTRAL HERNIA N/A 08/15/2022   Procedure: XI ROBOTIC ASSISTED VENTRAL HERNIA;  Surgeon: Rodolph Romano, MD;  Location: [facility] ORS;  Service: General;  Laterality: N/A;    (Not in a hospital admission)  Social History   Socioeconomic History   Marital status: Married    Spouse name: Whitney Pace   Number of children: 3   Years of education: Not on file   Highest education level: Not on file  Occupational History   Not on file  Tobacco Use   Smoking status: Never   Smokeless tobacco: Never  Vaping Use   Vaping status: Never Used  Substance and Sexual Activity   Alcohol  use: No    Comment: wine rarely   Drug use: No   Sexual activity: Not on file  Other Topics Concern   Not on file  Social History Narrative   Not on file   Social Drivers of Health   Financial Resource Strain: Low Risk  (06/23/2024)   Received from [facility]   Overall Financial Resource Strain (CARDIA)    Difficulty of Paying Living Expenses: Not hard at all  Food Insecurity: No Food Insecurity (06/23/2024)   Received from [facility]   Hunger Vital Sign    Within the past 12 months, you worried that your food would run out before you got the money to buy more.: Never true    Within the past 12 months, the food you bought just didn't last and you didn't have money to get more.: Never true  Transportation Needs: No Transportation Needs (06/23/2024)   Received from [facility] - Transportation    In the past 12 months, has lack of transportation kept you from medical appointments or from getting medications?: No    Lack of Transportation (Non-Medical): No  Physical Activity: Not on file  Stress: Not on file  Social Connections: Not on file  Intimate Partner Violence: Not on file    Family History  Problem Relation Age of Onset   Breast cancer Paternal Aunt        two aunts in their 46's     Vitals:   08/20/24 0645 08/20/24 0700 08/20/24 0730 08/20/24 0757  BP:  101/64 103/62   Pulse: 63  (!) 58   Resp: 15 14 13    Temp:      TempSrc:      SpO2: 96% 96% 98% 96%  Weight:      Height:        PHYSICAL EXAM General: Chronically ill-appearing elderly female, well nourished, in no acute distress. HEENT: Normocephalic and atraumatic. Neck: No JVD.  Lungs: Normal respiratory effort on room air. Clear bilaterally to auscultation. No wheezes, crackles, rhonchi.  Heart: HRRR. Normal S1 and S2 without gallops or murmurs.  Abdomen: Non-distended appearing.  Msk: Normal strength and tone for age. Extremities: Warm and well perfused. No clubbing, cyanosis.  No edema.  Neuro: Alert and oriented X 3. Psych: Answers questions appropriately.   Labs: Basic Metabolic Panel: Recent Labs    08/18/24 1943 08/18/24 2056 08/20/24 0243  NA 138  --  134*  K 3.9  --  4.2  CL 105  --  101  CO2 20*  --  20*  GLUCOSE 165*  --  132*  BUN 22  --  24*  CREATININE 1.02*  --  1.63*  CALCIUM  9.0  --  8.9  MG  --  2.5*  --    Liver Function Tests: Recent Labs    08/18/24 2056  AST 42*  ALT  31  ALKPHOS 77  BILITOT 0.7  PROT 6.9  ALBUMIN 4.0   No results for input(s): LIPASE, AMYLASE in the last 72 hours. CBC: Recent Labs    08/18/24 1943 08/20/24 0243  WBC 7.1 7.6  HGB 14.0 13.1  HCT 43.3 40.0  MCV 93.9 93.5  PLT 245 241   Cardiac Enzymes: Recent Labs    08/18/24 1943 08/18/24  2217  TROPONINIHS 10 16   BNP: Recent Labs    08/18/24 2056  BNP 561.4*   D-Dimer: Recent Labs    08/19/24 0258  DDIMER 1.90*   Hemoglobin A1C: No results for input(s): HGBA1C in the last 72 hours. Fasting Lipid Panel: No results for input(s): CHOL, HDL, LDLCALC, TRIG, CHOLHDL, LDLDIRECT in the last 72 hours. Thyroid  Function Tests: Recent Labs    08/18/24 2056  TSH 1.857   Anemia Panel: No results for input(s): VITAMINB12, FOLATE, FERRITIN, TIBC, IRON, RETICCTPCT in the last 72 hours.   Radiology: ECHOCARDIOGRAM COMPLETE Result Date: 08/19/2024    ECHOCARDIOGRAM REPORT   Patient Name:   Whitney Pace Date of Exam: 08/19/2024 Medical Rec #:  [id number]    Height:       62.0 in Accession #:    [id number]   Weight:       135.0 lb Date of Birth:  1946/06/10     BSA:          1.618 m Patient Age:    78 years     BP:           101/64 mmHg Patient Gender: F            HR:           118 bpm. Exam Location:  [facility] Procedure: 2D Echo, Cardiac Doppler and Color Doppler (Both Spectral and Color            Flow Doppler were utilized during procedure). Indications:     Atrial Fibrillation I48.91  History:         Patient has prior history of Echocardiogram examinations, most                  recent 02/12/2018. Arrythmias:Atrial Fibrillation and PVC,                  Signs/Symptoms:Dyspnea; Risk Factors:Hypertension and                  Dyslipidemia.  Sonographer:     Thea Norlander RCS Referring Phys:  [id number] Vella Colquitt Diagnosing Phys: Dwayne D Callwood MD IMPRESSIONS  1. Ant/apical/septal lat hypo . Cardiomyopathy new since 2019 EF was 79%.  2. Left  ventricular ejection fraction, by estimation, is 30 to 35%. The left ventricle has moderately decreased function. The left ventricle demonstrates regional wall motion abnormalities (see scoring diagram/findings for description). Left ventricular  diastolic parameters are consistent with Grade III diastolic dysfunction (restrictive).  3. Right ventricular systolic function is low normal. The right ventricular size is normal.  4. Left atrial size was moderately dilated.  5. Right atrial size was mild to moderately dilated.  6. The mitral valve is degenerative. Mild to moderate mitral valve regurgitation.  7. Tricuspid valve regurgitation is moderate to severe.  8. The aortic valve is calcified. Aortic valve regurgitation is trivial. Aortic valve sclerosis is present, with no evidence of aortic valve stenosis. FINDINGS  Left Ventricle: Left ventricular ejection fraction, by estimation, is 30 to 35%. The left ventricle has moderately decreased function. The left ventricle demonstrates regional wall motion abnormalities. Strain was performed and the global longitudinal strain is indeterminate. The left ventricular internal cavity size was normal in size. There is borderline left ventricular hypertrophy. Abnormal (paradoxical) septal motion, consistent with left bundle branch block. Left ventricular diastolic parameters  are consistent with Grade III diastolic dysfunction (restrictive). Right Ventricle: The right ventricular size is normal. No increase in right ventricular wall  thickness. Right ventricular systolic function is low normal. Left Atrium: Left atrial size was moderately dilated. Right Atrium: Right atrial size was mild to moderately dilated. Pericardium: There is no evidence of pericardial effusion. Mitral Valve: The mitral valve is degenerative in appearance. There is mild thickening of the mitral valve leaflet(s). There is mild calcification of the mitral valve leaflet(s). Mild to moderate mitral annular  calcification. Mild to moderate mitral valve regurgitation. Tricuspid Valve: The tricuspid valve is grossly normal. Tricuspid valve regurgitation is moderate to severe. Aortic Valve: The aortic valve is calcified. Aortic valve regurgitation is trivial. Aortic valve sclerosis is present, with no evidence of aortic valve stenosis. Aortic valve peak gradient measures 2.2 mmHg. Pulmonic Valve: The pulmonic valve was normal in structure. Pulmonic valve regurgitation is not visualized. Aorta: The ascending aorta was not well visualized. IAS/Shunts: No atrial level shunt detected by color flow Doppler. Additional Comments: Ant/apical/septal lat hypo . Cardiomyopathy new since 2019 EF was 79%. 3D was performed not requiring image post processing on an independent workstation and was indeterminate.  LEFT VENTRICLE PLAX 2D LVIDd:         4.20 cm   Diastology LVIDs:         2.90 cm   LV e' medial:    4.46 cm/s LV PW:         1.20 cm   LV E/e' medial:  26.0 LV IVS:        1.00 cm   LV e' lateral:   6.66 cm/s LVOT diam:     1.90 cm   LV E/e' lateral: 17.4 LV SV:         22 LV SV Index:   14 LVOT Area:     2.84 cm  RIGHT VENTRICLE            IVC RV S prime:     9.25 cm/s  IVC diam: 2.40 cm LEFT ATRIUM             Index        RIGHT ATRIUM           Index LA diam:        5.00 cm 3.09 cm/m   RA Area:     23.20 cm LA Vol (A2C):   88.8 ml 54.90 ml/m  RA Volume:   61.40 ml  37.96 ml/m LA Vol (A4C):   87.5 ml 54.09 ml/m LA Biplane Vol: 90.6 ml 56.01 ml/m  AORTIC VALVE AV Area (Vmax): 2.37 cm AV Vmax:        73.80 cm/s AV Peak Grad:   2.2 mmHg LVOT Vmax:      61.66 cm/s LVOT Vmean:     39.140 cm/s LVOT VTI:       0.079 m  AORTA Ao Root diam: 2.80 cm Ao Asc diam:  2.90 cm MITRAL VALVE                TRICUSPID VALVE MV Area (PHT): 6.54 cm     TR Peak grad:   34.8 mmHg MV Decel Time: 116 msec     TR Vmax:        295.00 cm/s MR Peak grad: 53.1 mmHg MR Vmax:      364.50 cm/s   SHUNTS MV E velocity: 116.00 cm/s  Systemic VTI:  0.08 m  MV A velocity: 34.00 cm/s   Systemic Diam: 1.90 cm MV E/A ratio:  3.41 Dwayne JONETTA Lovelace MD Electronically signed by Cara JONETTA Lovelace MD Signature Date/Time:  08/19/2024/5:02:28 PM    Final    CT Angio Chest Pulmonary Embolism (PE) W or WO Contrast Result Date: 08/19/2024 EXAM: CTA CHEST 08/19/2024 06:31:42 AM TECHNIQUE: CTA of the chest was performed without and with the administration of 75 mL of iohexol (OMNIPAQUE) 350 MG/ML injection. Multiplanar reformatted images are provided for review. MIP images are provided for review. Automated exposure control, iterative reconstruction, and/or weight based adjustment of the mA/kV was utilized to reduce the radiation dose to as low as reasonably achievable. COMPARISON: CT of the head dated 12/24/2006. CLINICAL HISTORY: Pulmonary embolism (PE) suspected, low to intermediate prob, neg D-dimer. FINDINGS: PULMONARY ARTERIES: Pulmonary arteries are adequately opacified for evaluation. There are no discrete intraluminal filling defects present within the pulmonary arteries. There is suboptimal opacification of the subsegmental arteries within the lung bases bilaterally, likely secondary to streaky areas of peribronchovascular opacification/consolidation. Main pulmonary artery is normal in caliber. MEDIASTINUM: The heart is moderately enlarged. There is mild-to-moderate atheromatous disease within the thoracic aorta. There is intrathoracic goiter present on the left. LYMPH NODES: There is some shotty mediastinal lymphadenopathy. No hilar or axillary lymphadenopathy. LUNGS AND PLEURA: There are hazy and patchy peribronchovascular opacities also present within the left upper lobe and right middle lobe. There is a moderate right-sided pleural effusion and a mild-to-moderate left effusion. No pneumothorax. UPPER ABDOMEN: Limited images of the upper abdomen are unremarkable. SOFT TISSUES AND BONES: No acute bone or soft tissue abnormality. IMPRESSION: 1. No evidence of pulmonary  embolism, although evaluation is limited by suboptimal opacification of the subsegmental arteries within the lung bases bilaterally. 2. Moderate right-sided pleural effusion and mild-to-moderate left effusion. 3. Moderately enlarged heart. 4. 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 Electronically signed by: Evalene Coho MD 08/19/2024 06:58 AM EDT RP Workstation: [id number]   DG Chest Port 1 View Result Date: 08/18/2024 EXAM: 1 VIEW XRAY OF THE CHEST 08/18/2024 08:14:00 PM COMPARISON: Chest x-ray 02/11/2018. CLINICAL HISTORY: Chest pain. The patient states multiple times this week she has had episodes of feeling light headed and chest pain. Pt reports pain in chest was worse tonight. Hx of HTN and mitral valve repair. FINDINGS: LUNGS AND PLEURA: There are patchy bilateral lower lung infiltrates. No pulmonary edema. No pleural effusion. No pneumothorax. HEART AND MEDIASTINUM: Heart is mildly enlarged. BONES AND SOFT TISSUES: No acute osseous abnormality. IMPRESSION: 1. Patchy bilateral lower lung infiltrates. 2. Mild cardiomegaly. Electronically signed by: Greig Pique MD 08/18/2024 08:25 PM EDT RP Workstation: [id number]    ECHO pending  TELEMETRY (personally reviewed): Atrial fibrillation rate 120-130s  EKG (personally reviewed): atrial fibrillation RVR rate 142 bpm with PVCs  Data reviewed by me 08/20/2024: last 24h vitals tele labs imaging I/O ED provider note, admission H&P  Principal Problem:   Paroxysmal atrial fibrillation with rapid ventricular response (HCC) Active Problems:   Essential hypertension   Chronic venous insufficiency   MVP (mitral valve prolapse) s/p repair 2008   Alzheimer's dementia without behavioral disturbance (HCC)   Possible acute CHF (HCC)   Dyspnea on exertion    ASSESSMENT AND PLAN:  CATHRINE KRIZAN is a 78 y.o. female  with a past medical history of hypertension, hyperlipidemia, questionable atrial fibrillation, mitral valve prolapse s/p repair 15 years ago,  PVCs, dementia who presented to the ED on 08/18/2024 for chest discomfort, weakness.  Found to be in atrial fibrillation RVR in the ED.  Cardiology was consulted for further evaluation.   # Atrial fibrillation RVR # ?Hx atrial fibrillation # New cardiomyopathy # Hypertension #  Frequent PVCs # Mitral valve prolapse s/p repair Patient brought to the ED after not feeling well for 1 to 2 days, found to be in atrial fibrillation RVR on EKG.  Troponins normal x 2.  Started on IV diltiazem in the ED but uptitration was limited by borderline blood pressure. Echo with EF 30-35%, Ant/apical/septal lat hypo (?stress-induced vs ischemic - patient without any anginal symptoms, trops minimal), mild to moderate MR, moderate to severe TR.  - Resume home metoprolol  succinate 25 mg BID for rate control and GDMT. Cost of both farxiga/jardiance $45/mo, consider prior to DC. - Will defer resuming PO amiodarone at this time. Can consider outpatient if she has recurrence of AF.  - Continue Eliquis 5 mg twice daily for stroke risk reduction. - Continue aspirin  81 mg daily.   This patient's plan of care was discussed and created with Dr. Florencio and he is in agreement.  Signed: Danita Bloch, PA-C  08/20/2024, 9:28 AM [facility]

## 2024-08-20 NOTE — Progress Notes (Signed)
 PROGRESS NOTE Whitney Pace  FMW:[id number] DOB: June 25, 1946 DOA: 08/18/2024 PCP: Cleotilde Oneil FALCON, MD  Brief Narrative:  Whitney Pace is a 78 y.o. female with medical history significant for HTN, HLD, mitral valve repair 2008, Alzheimer's dementia, PVCs versus paroxysmal [id number], PACs on Holter), not on AC (last seen by cardiology 07/14/2024), presenting with a several day history of multiple episodes of sudden onset shortness of breath associated with chest discomfort and lightheadedness. In the ED in rapid A-fib to 142 with otherwise normal vitals Labs notable for troponin 10, BNP 561 Potassium and magnesium  normal Creatinine 1.02 (0.9, 01/2024).  BMP otherwise unremarkable CBC normal Hepatic function panel notable only for slightly elevated AST of 42. EKG with A-fib at 142 Chest x-ray showing patchy bilateral lower lung infiltrates and mild cardiomegaly, no pulmonary edema or pleural effusion Patient was given a diltiazem bolus started on an infusion.  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 Home meds include amiodarone which is being held - cardiology following  Echo with EF 30-35%, Ant/apical/septal lat hypo (?stress-induced vs ischemic - patient without any anginal symptoms, trops minimal), mild to moderate MR, moderate to severe TR.  - Resume home metoprolol  succinate 25 mg BID for rate control and GDMT.  # CHF exacerbation Likely mediated by a-fib. S/p lasix. Appears improved  # Pleural effusions Likely 2/2 chf from a fib. Satting normally, breathing comfortably, no s/s infection - diuresis  # Dementia Calm - home galantamine on  hold given new cardiac dysfunction - continue home memantine  # HTN Soft bps - home amlodipine on hold  # Insomnia - hold amitryptyline given cardiac dysfunction  DVT prophylaxis: apixaban Code Status: full Family Communication: husband at bedside  Level of care: Progressive Status is: Observation  Consultants:  cardiology  Procedures: None thus far  Antimicrobials:  none   Subjective: Reports feeling fine. No palpitations.   Objective: Vitals:   08/20/24 0630 08/20/24 0645 08/20/24 0700 08/20/24 0757  BP: 100/61  101/64   Pulse:  63    Resp: 14 15 14    Temp:      TempSrc:      SpO2: 93% 96% 96% 96%  Weight:      Height:       No intake or output data in the 24 hours ending 08/20/24 9177 Filed Weights   08/18/24 1936  Weight: 61.2 kg   Examination: General exam: Appears calm and comfortable  Respiratory system: Clear to auscultation except decreased sounds and rales at bases Cardiovascular system: S1 & S2 heard, distant sounds, regular rate Gastrointestinal system: Abdomen is nondistended, soft and nontender.  Central nervous system: Alert and oriented to self, moving all 4 Extremities: Symmetric 5 x 5 power. Trace LE edema Skin: No rashes, lesions or ulcers Psychiatry: confused, calm  Data Reviewed: I have personally reviewed following labs and imaging studies  CBC: Recent Labs  Lab 08/18/24 1943 08/20/24 0243  WBC 7.1 7.6  HGB 14.0 13.1  HCT 43.3 40.0  MCV 93.9 93.5  PLT 245 241   Basic Metabolic Panel: Recent Labs  Lab 08/18/24 1943 08/18/24 2056 08/20/24 0243  NA 138  --  134*  K 3.9  --  4.2  CL 105  --  101  CO2 20*  --  20*  GLUCOSE 165*  --  132*  BUN 22  --  24*  CREATININE 1.02*  --  1.63*  CALCIUM  9.0  --  8.9  MG  --  2.5*  --    GFR: Estimated Creatinine Clearance: 23.7 mL/min (A) (by C-G formula based on SCr of 1.63 mg/dL (H)). Liver Function Tests: Recent Labs  Lab 08/18/24 2056  AST 42*  ALT 31  ALKPHOS 77   BILITOT 0.7  PROT 6.9  ALBUMIN 4.0    LOS: 1 day   Whitney LITTIE Piety, MD [facility]  If 7PM-7AM, please contact night-coverage [email or web address] Password [email or web address] 08/20/2024, 8:22 AM

## 2024-08-20 NOTE — ED Notes (Signed)
 This tech assisted the pt to the bathroom, gait was steady with no complaints

## 2024-08-20 NOTE — ED Notes (Signed)
 The pt appears alert but at times confused when asked questions.

## 2024-08-21 DIAGNOSIS — L8990 Pressure ulcer of unspecified site, unspecified stage: Secondary | ICD-10-CM | POA: Insufficient documentation

## 2024-08-21 DIAGNOSIS — J9601 Acute respiratory failure with hypoxia: Principal | ICD-10-CM

## 2024-08-21 DIAGNOSIS — I509 Heart failure, unspecified: Secondary | ICD-10-CM

## 2024-08-21 LAB — CBC
HCT: 38.6 % (ref 36.0–46.0)
Hemoglobin: 12.9 g/dL (ref 12.0–15.0)
MCH: 30.8 pg (ref 26.0–34.0)
MCHC: 33.4 g/dL (ref 30.0–36.0)
MCV: 92.1 fL (ref 80.0–100.0)
Platelets: 208 K/uL (ref 150–400)
RBC: 4.19 MIL/uL (ref 3.87–5.11)
RDW: 13.6 % (ref 11.5–15.5)
WBC: 8.2 K/uL (ref 4.0–10.5)
nRBC: 0 % (ref 0.0–0.2)

## 2024-08-21 LAB — BASIC METABOLIC PANEL WITH GFR
Anion gap: 8 (ref 5–15)
BUN: 23 mg/dL (ref 8–23)
CO2: 22 mmol/L (ref 22–32)
Calcium: 9 mg/dL (ref 8.9–10.3)
Chloride: 103 mmol/L (ref 98–111)
Creatinine, Ser: 1.21 mg/dL — ABNORMAL HIGH (ref 0.44–1.00)
GFR, Estimated: 45 mL/min — ABNORMAL LOW (ref >60)
Glucose, Bld: 126 mg/dL — ABNORMAL HIGH (ref 70–99)
Potassium: 3.9 mmol/L (ref 3.5–5.1)
Sodium: 133 mmol/L — ABNORMAL LOW (ref 135–145)

## 2024-08-21 MED ORDER — APIXABAN 5 MG PO TABS: 5.0000 mg | ORAL_TABLET | Freq: Two times a day (BID) | ORAL | 0 refills | Status: AC

## 2024-08-21 MED ORDER — DAPAGLIFLOZIN PROPANEDIOL 10 MG PO TABS: 10.0000 mg | ORAL_TABLET | Freq: Every day | ORAL | 0 refills | Status: AC

## 2024-08-21 MED ORDER — DAPAGLIFLOZIN PROPANEDIOL 10 MG PO TABS
10.0000 mg | ORAL_TABLET | Freq: Every day | ORAL | Status: DC
  Administered 2024-08-21: 10 mg via ORAL
  Filled 2024-08-21: qty 1

## 2024-08-21 NOTE — TOC Transition Note (Signed)
 Transition of Care [facility]) - Discharge Note   Patient Details  Name: Whitney Pace MRN: [id number] Date of Birth: 1946-03-21  Transition of Care [facility]) CM/SW Contact:  Alfonso Rummer, LCSW Phone Number: 08/21/2024, 4:26 PM   Clinical Narrative:    Pt will dx home with [facility] for PT/OT. Rolling Vannie will be delivered to pt home through [employer]. No further TOC needs. TOC signing off.    Final next level of care: Home w Home Health Services Barriers to Discharge: Barriers Resolved   Patient Goals and CMS Choice     Choice offered to / list presented to : Patient, Spouse      Discharge Placement             Home with [facility] for PT and OT      Name of family member notified: Patient and Spouse Patient and family notified of of transfer: 08/21/24  Discharge Plan and Services Additional resources added to the After Visit Summary for                  DME Arranged: Walker rolling DME Agency: [employer] Date DME Agency Contacted: 08/21/24   Representative spoke with at DME Agency: Mitch---[employer] will deliver walker to pt home HH Arranged: PT, OT HH Agency: [facility] Date [facility] Agency Contacted: 08/21/24   Representative spoke with at [facility] Agency: Larraine Blush  Social Drivers of Health (SDOH) Interventions SDOH Screenings   Food Insecurity: No Food Insecurity (08/20/2024)  Housing: Unknown (08/20/2024)  Transportation Needs: No Transportation Needs (08/20/2024)  Utilities: Not At Risk (08/20/2024)  Financial Resource Strain: Low Risk  (06/23/2024)   Received from [facility]  Social Connections: Patient Unable To Answer (08/20/2024)  Tobacco Use: Low Risk  (08/18/2024)     Readmission Risk Interventions     No data to display

## 2024-08-21 NOTE — Discharge Instructions (Addendum)
 Your heart rate has returned to normal and well controlled on your home metoprolol . We have stopped your amlodipine due to your blood pressures being on the lower side of normal and already look good currently. On follow up, your cardiologist may add on additional medications that help prevent worsening heart failure.  You have been started on eliquis (apixaban) which prevents blood clots from forming in people who have abnormal heart rhythms which would be a risk for causing a stroke.   Please follow up with cardiology in 1 week.   Follow up with your PCP in about 2 weeks to discuss your medications and if you have concerns about the sedating effects of them.   [facility] Home Health PT and OT arranged [phone].

## 2024-08-21 NOTE — Progress Notes (Signed)
 Heart Failure Stewardship Pharmacy Note  PCP: Cleotilde Oneil FALCON, MD PCP-Cardiologist: None  HPI: Whitney Pace is a 78 y.o. female with HTN, HLD, MV repair in 2008, Alzheimer's dementia, paroxysmal AF who presented with shortness of breath, chest discomfort, light headedness, and confusion. On admission, BNP was 561.4, HS-troponin was 16. ECG on admission noted AF RVR. Chest x-ray noted patchy lower lung infiltrates.   Pertinent cardiac history: Mitral valve repair in 12/2006. LVEF normal in all TTEs prior to hospitalization 08/2024. TTE this admission noted LVEF of 30-35%, G3DD, low-normal RV function, moderate to severe TR, septal motion consistent with LBBB, mild to moderate MR.   Pertinent Lab Values: Creatinine  Date Value Ref Range Status  11/19/2014 1.04 0.60 - 1.30 mg/dL Final   Creatinine, Ser  Date Value Ref Range Status  08/21/2024 1.21 (H) 0.44 - 1.00 mg/dL Final   BUN  Date Value Ref Range Status  08/21/2024 23 8 - 23 mg/dL Final  98/84/7983 20 (H) 7 - 18 mg/dL Final   Potassium  Date Value Ref Range Status  08/21/2024 3.9 3.5 - 5.1 mmol/L Final  11/19/2014 4.0 3.5 - 5.1 mmol/L Final   Sodium  Date Value Ref Range Status  08/21/2024 133 (L) 135 - 145 mmol/L Final  11/19/2014 139 136 - 145 mmol/L Final   B Natriuretic Peptide  Date Value Ref Range Status  08/18/2024 561.4 (H) 0.0 - 100.0 pg/mL Final    Comment:    Performed at [facility], [address], [place], [place] [address]   Magnesium   Date Value Ref Range Status  08/18/2024 2.5 (H) 1.7 - 2.4 mg/dL Final    Comment:    Performed at [facility], [address] [place], [place] [address]   TSH  Date Value Ref Range Status  08/18/2024 1.857 0.350 - 4.500 uIU/mL Final    Comment:    Performed by a 3rd Generation assay with a functional sensitivity of <=0.01 uIU/mL. Performed at [facility], [address] [place], [place] [address]     Vital Signs:  Temp:  [97.6  F (36.4 C)-98 F (36.7 C)] 97.6 F (36.4 C) (10/17 0406) Pulse Rate:  [61-69] 66 (10/17 0406) Cardiac Rhythm: Other (Comment) (10/16 1902) Resp:  [16-19] 17 (10/17 0406) BP: (91-132)/(71-82) 132/77 (10/17 0406) SpO2:  [93 %-99 %] 97 % (10/17 0406)  Intake/Output Summary (Last 24 hours) at 08/21/2024 0807 Last data filed at 08/21/2024 0406 Gross per 24 hour  Intake 367.75 ml  Output 450 ml  Net -82.25 ml    Current Heart Failure Medications:  Loop diuretic: none Beta-Blocker: metoprolol  succinate 25 mg daily ACEI/ARB/ARNI: none MRA: none SGLT2i: none Other: none  Prior to admission Heart Failure Medications:  Loop diuretic: none Beta-Blocker: metoprolol  succinate 25 mg daily ACEI/ARB/ARNI: none MRA: none SGLT2i: none Other: none  Assessment: 1. Acute systolic heart failure (LVEF 30-35%) with G3DD and low-normal RV function, due to unknown etiology. NYHA class III symptoms.  -Symptoms: Patient's primary complain is confusion at this moment. This may be due to home polypharmacy. Denies LEE and shortness of breath. -Volume: Volume difficult to assess. Creatinine increased significantly after 1 dose of furosemide. Continue off diuretics at this time. -Hemodynamics: BP softer on admission, now trending up. HR 60s. -AA:Rnwupwlz metoprolol  succinate 25 mg daily -ACEI/ARB/ARNI: Can consider adding losartan  12.5 mg daily prior to discharge if creatinine is stable -MRA: Can consider adding spironolactone 12.5 mg daily prior to discharge if creatinine is stable -SGLT2i:  Consider adding Farxiga or Jardiance prior to discharge. No notable UTI history.  Plan: 1) Medication changes recommended at this time: -None  2) Patient assistance: -Pending  3) Education: - Patient has been educated on current HF medications and potential additions to HF medication regimen - Patient verbalizes understanding that over the next few months, these medication doses may change and more  medications may be added to optimize HF regimen - Patient has been educated on basic disease state pathophysiology and goals of therapy  Medication Assistance / Insurance Benefits Check: Does the patient have prescription insurance?    Please do not hesitate to reach out with questions or concerns,  Jaun Bash, PharmD, CPP, BCPS, [facility] Heart Failure Pharmacist  Phone - [phone] 08/21/2024 10:13 AM

## 2024-08-21 NOTE — Progress Notes (Cosign Needed Addendum)
 [facility] CLINIC CARDIOLOGY PROGRESS NOTE       Patient ID: Whitney Pace MRN: [id number] DOB/AGE: 01-15-1946 78 y.o.  Admit date: 08/18/2024 Referring Physician Dr. Delayne Solian Primary Physician Cleotilde Oneil FALCON, MD  Primary Cardiologist Dr. Ammon Reason for Consultation A-fib RVR  HPI: Whitney Pace is a 78 y.o. female  with a past medical history of hypertension, hyperlipidemia, questionable atrial fibrillation, mitral valve prolapse s/p repair 15 years ago, PVCs, dementia who presented to the ED on 08/18/2024 for chest discomfort, weakness.  Found to be in atrial fibrillation RVR in the ED.  Cardiology was consulted for further evaluation.   Interval history: -Patient seen and examined this AM, resting in bed with husband at bedside.  -HR controlled, remains in normal sinus rhythm. -BP stable.   Review of systems complete and found to be negative unless listed above    Past Medical History:  Diagnosis Date   Anxiety    a.) on BZO (alprazolam ) PRN   Arthritis    Childhood asthma    Diastolic dysfunction    a.) TTE 06/04/2018: EF 50%, mild RAE, trivial AR/PR, mild MR/TR, G2DD   GERD (gastroesophageal reflux disease)    Glaucoma    Heart murmur    History of cardiac cath 12/16/2006   a.) LHC 12/16/2006: norm coronaries with no obstructive CAD; definite MVP   Hyperlipidemia    Hypertension    Imbalance    Lymphedema of left lower extremity    Major depression    Memory loss    a.) on memantine   MVP (mitral valve prolapse)    a.) s/p ring valvuloplasty at [facility] 12/17/2006   Numbness in feet    PVC (premature ventricular contraction)    Raynaud disease    S/P MVR (mitral valve replacement) 12/17/2006   a.) ring valvuloplasty via RIGHT thoracotomy approach; 40 mm Seguin ring   Skin cancer    Spigelian hernia    Uterine cancer (HCC)     Past Surgical History:  Procedure Laterality Date   BREAST CYST ASPIRATION Bilateral    negative   CATARACT EXTRACTION W/  INTRAOCULAR LENS IMPLANT Bilateral    EYE SURGERY Left    repair detatched retina x 2, removal of bubble' AND SCAR TISSUE IN EYE   EYE SURGERY Left    laser treatment left eye x 3   LEFT HEART CATH AND CORONARY ANGIOGRAPHY Left 12/16/2006   Procedure: LEFT HEART CATH AND CORONARY ANGIOGRAPHY; Location: [facility]; Surgeon: Rodgers Sink, MD   MITRAL VALVE REPAIR Right 12/17/2006   Procedure: RIGHT THORACOTOMY AND RING VALVULOPLASTY (MITRAL); Location: [facility]   TOTAL ABDOMINAL HYSTERECTOMY W/ BILATERAL SALPINGOOPHORECTOMY     TOTAL HIP ARTHROPLASTY Left 10/15/2016   Procedure: LEFT TOTAL HIP ARTHROPLASTY ANTERIOR APPROACH;  Surgeon: Donnice Car, MD;  Location: [facility] ORS;  Service: Orthopedics;  Laterality: Left;   XI ROBOTIC ASSISTED VENTRAL HERNIA N/A 08/15/2022   Procedure: XI ROBOTIC ASSISTED VENTRAL HERNIA;  Surgeon: Rodolph Romano, MD;  Location: [facility] ORS;  Service: General;  Laterality: N/A;    Medications Prior to Admission  Medication Sig Dispense Refill Last Dose/Taking   ALPRAZolam  (XANAX ) 0.25 MG tablet Take 0.25 mg by mouth 2 (two) times daily as needed for anxiety.    Unknown   amitriptyline (ELAVIL) 10 MG tablet Take 10 mg by mouth at bedtime.   08/17/2024 Bedtime   amLODipine (NORVASC) 2.5 MG tablet Take 2.5 mg by mouth every evening.   08/17/2024 Evening   aspirin  EC 81  MG tablet Take 81 mg by mouth at bedtime.    08/17/2024 Bedtime   dorzolamide  (TRUSOPT ) 2 % ophthalmic solution Place 1 drop into both eyes 2 (two) times daily.   08/18/2024 Morning   galantamine (RAZADYNE) 4 MG tablet Take 4 mg by mouth 2 (two) times daily with a meal.   08/18/2024 Morning   ketorolac  (ACULAR ) 0.5 % ophthalmic solution Place 1 drop into the left eye 2 (two) times daily.   08/18/2024 Morning   lovastatin (MEVACOR) 40 MG tablet Take 40 mg by mouth daily with supper.   08/17/2024 Evening   memantine (NAMENDA) 10 MG tablet Take 10 mg by mouth 2 (two) times daily.   08/18/2024 Morning   metoprolol   succinate (TOPROL -XL) 25 MG 24 hr tablet Take 25 mg by mouth daily.   08/18/2024   PARoxetine (PAXIL) 30 MG tablet Take 30 mg by mouth at bedtime.   08/17/2024 Bedtime   QUEtiapine (SEROQUEL) 25 MG tablet Take 25 mg by mouth at bedtime.   08/17/2024   traZODone (DESYREL) 50 MG tablet Take 1 tablet by mouth at bedtime.   08/17/2024 Bedtime   Social History   Socioeconomic History   Marital status: Married    Spouse name: Ubaldo   Number of children: 3   Years of education: Not on file   Highest education level: Not on file  Occupational History   Not on file  Tobacco Use   Smoking status: Never   Smokeless tobacco: Never  Vaping Use   Vaping status: Never Used  Substance and Sexual Activity   Alcohol  use: No    Comment: wine rarely   Drug use: No   Sexual activity: Not on file  Other Topics Concern   Not on file  Social History Narrative   Not on file   Social Drivers of Health   Financial Resource Strain: Low Risk  (06/23/2024)   Received from [facility]   Overall Financial Resource Strain (CARDIA)    Difficulty of Paying Living Expenses: Not hard at all  Food Insecurity: No Food Insecurity (08/20/2024)   Hunger Vital Sign    Worried About Running Out of Food in the Last Year: Never true    Ran Out of Food in the Last Year: Never true  Transportation Needs: No Transportation Needs (08/20/2024)   PRAPARE - Administrator, Civil Service (Medical): No    Lack of Transportation (Non-Medical): No  Physical Activity: Not on file  Stress: Not on file  Social Connections: Patient Unable To Answer (08/20/2024)   Social Connection and Isolation Panel    Frequency of Communication with Friends and Family: Patient unable to answer    Frequency of Social Gatherings with Friends and Family: Patient unable to answer    Attends Religious Services: Patient unable to answer    Active Member of Clubs or Organizations: Patient unable to answer    Attends  Banker Meetings: Patient unable to answer    Marital Status: Patient unable to answer  Intimate Partner Violence: Patient Unable To Answer (08/20/2024)   Humiliation, Afraid, Rape, and Kick questionnaire    Fear of Current or Ex-Partner: Patient unable to answer    Emotionally Abused: Patient unable to answer    Physically Abused: Patient unable to answer    Sexually Abused: Patient unable to answer    Family History  Problem Relation Age of Onset   Breast cancer Paternal Aunt  two aunts in their 28's     Vitals:   08/21/24 0045 08/21/24 0406 08/21/24 0907 08/21/24 0938  BP: 111/72 132/77 121/82   Pulse: 66 66 64 65  Resp: 17 17 18    Temp: 97.6 F (36.4 C) 97.6 F (36.4 C) 97.8 F (36.6 C)   TempSrc: Oral Oral Oral   SpO2: 96% 97% 96%   Weight:      Height:        PHYSICAL EXAM General: Chronically ill-appearing elderly female, well nourished, in no acute distress. HEENT: Normocephalic and atraumatic. Neck: No JVD.  Lungs: Normal respiratory effort on room air. Clear bilaterally to auscultation. No wheezes, crackles, rhonchi.  Heart: HRRR. Normal S1 and S2 without gallops or murmurs.  Abdomen: Non-distended appearing.  Msk: Normal strength and tone for age. Extremities: Warm and well perfused. No clubbing, cyanosis.  No edema.  Neuro: Alert and oriented X 3. Psych: Answers questions appropriately.   Labs: Basic Metabolic Panel: Recent Labs    08/18/24 2056 08/20/24 0243 08/21/24 0443  NA  --  134* 133*  K  --  4.2 3.9  CL  --  101 103  CO2  --  20* 22  GLUCOSE  --  132* 126*  BUN  --  24* 23  CREATININE  --  1.63* 1.21*  CALCIUM   --  8.9 9.0  MG 2.5*  --   --    Liver Function Tests: Recent Labs    08/18/24 2056  AST 42*  ALT 31  ALKPHOS 77  BILITOT 0.7  PROT 6.9  ALBUMIN 4.0   No results for input(s): LIPASE, AMYLASE in the last 72 hours. CBC: Recent Labs    08/20/24 0243 08/21/24 0443  WBC 7.6 8.2  HGB 13.1 12.9   HCT 40.0 38.6  MCV 93.5 92.1  PLT 241 208   Cardiac Enzymes: Recent Labs    08/18/24 1943 08/18/24 2217  TROPONINIHS 10 16   BNP: Recent Labs    08/18/24 2056  BNP 561.4*   D-Dimer: Recent Labs    08/19/24 0258  DDIMER 1.90*   Hemoglobin A1C: No results for input(s): HGBA1C in the last 72 hours. Fasting Lipid Panel: No results for input(s): CHOL, HDL, LDLCALC, TRIG, CHOLHDL, LDLDIRECT in the last 72 hours. Thyroid  Function Tests: Recent Labs    08/18/24 2056  TSH 1.857   Anemia Panel: No results for input(s): VITAMINB12, FOLATE, FERRITIN, TIBC, IRON, RETICCTPCT in the last 72 hours.   Radiology: ECHOCARDIOGRAM COMPLETE Result Date: 08/19/2024    ECHOCARDIOGRAM REPORT   Patient Name:   Whitney Pace Date of Exam: 08/19/2024 Medical Rec #:  [id number]    Height:       62.0 in Accession #:    [id number]   Weight:       135.0 lb Date of Birth:  01-22-1946     BSA:          1.618 m Patient Age:    78 years     BP:           101/64 mmHg Patient Gender: F            HR:           118 bpm. Exam Location:  [facility] Procedure: 2D Echo, Cardiac Doppler and Color Doppler (Both Spectral and Color            Flow Doppler were utilized during procedure). Indications:     Atrial Fibrillation I48.91  History:  Patient has prior history of Echocardiogram examinations, most                  recent 02/12/2018. Arrythmias:Atrial Fibrillation and PVC,                  Signs/Symptoms:Dyspnea; Risk Factors:Hypertension and                  Dyslipidemia.  Sonographer:     Thea Norlander RCS Referring Phys:  [id number] Gustie Bobb Diagnosing Phys: Dwayne D Callwood MD IMPRESSIONS  1. Ant/apical/septal lat hypo . Cardiomyopathy new since 2019 EF was 79%.  2. Left ventricular ejection fraction, by estimation, is 30 to 35%. The left ventricle has moderately decreased function. The left ventricle demonstrates regional wall motion abnormalities (see scoring diagram/findings for  description). Left ventricular  diastolic parameters are consistent with Grade III diastolic dysfunction (restrictive).  3. Right ventricular systolic function is low normal. The right ventricular size is normal.  4. Left atrial size was moderately dilated.  5. Right atrial size was mild to moderately dilated.  6. The mitral valve is degenerative. Mild to moderate mitral valve regurgitation.  7. Tricuspid valve regurgitation is moderate to severe.  8. The aortic valve is calcified. Aortic valve regurgitation is trivial. Aortic valve sclerosis is present, with no evidence of aortic valve stenosis. FINDINGS  Left Ventricle: Left ventricular ejection fraction, by estimation, is 30 to 35%. The left ventricle has moderately decreased function. The left ventricle demonstrates regional wall motion abnormalities. Strain was performed and the global longitudinal strain is indeterminate. The left ventricular internal cavity size was normal in size. There is borderline left ventricular hypertrophy. Abnormal (paradoxical) septal motion, consistent with left bundle branch block. Left ventricular diastolic parameters  are consistent with Grade III diastolic dysfunction (restrictive). Right Ventricle: The right ventricular size is normal. No increase in right ventricular wall thickness. Right ventricular systolic function is low normal. Left Atrium: Left atrial size was moderately dilated. Right Atrium: Right atrial size was mild to moderately dilated. Pericardium: There is no evidence of pericardial effusion. Mitral Valve: The mitral valve is degenerative in appearance. There is mild thickening of the mitral valve leaflet(s). There is mild calcification of the mitral valve leaflet(s). Mild to moderate mitral annular calcification. Mild to moderate mitral valve regurgitation. Tricuspid Valve: The tricuspid valve is grossly normal. Tricuspid valve regurgitation is moderate to severe. Aortic Valve: The aortic valve is calcified.  Aortic valve regurgitation is trivial. Aortic valve sclerosis is present, with no evidence of aortic valve stenosis. Aortic valve peak gradient measures 2.2 mmHg. Pulmonic Valve: The pulmonic valve was normal in structure. Pulmonic valve regurgitation is not visualized. Aorta: The ascending aorta was not well visualized. IAS/Shunts: No atrial level shunt detected by color flow Doppler. Additional Comments: Ant/apical/septal lat hypo . Cardiomyopathy new since 2019 EF was 79%. 3D was performed not requiring image post processing on an independent workstation and was indeterminate.  LEFT VENTRICLE PLAX 2D LVIDd:         4.20 cm   Diastology LVIDs:         2.90 cm   LV e' medial:    4.46 cm/s LV PW:         1.20 cm   LV E/e' medial:  26.0 LV IVS:        1.00 cm   LV e' lateral:   6.66 cm/s LVOT diam:     1.90 cm   LV E/e' lateral: 17.4 LV SV:  22 LV SV Index:   14 LVOT Area:     2.84 cm  RIGHT VENTRICLE            IVC RV S prime:     9.25 cm/s  IVC diam: 2.40 cm LEFT ATRIUM             Index        RIGHT ATRIUM           Index LA diam:        5.00 cm 3.09 cm/m   RA Area:     23.20 cm LA Vol (A2C):   88.8 ml 54.90 ml/m  RA Volume:   61.40 ml  37.96 ml/m LA Vol (A4C):   87.5 ml 54.09 ml/m LA Biplane Vol: 90.6 ml 56.01 ml/m  AORTIC VALVE AV Area (Vmax): 2.37 cm AV Vmax:        73.80 cm/s AV Peak Grad:   2.2 mmHg LVOT Vmax:      61.66 cm/s LVOT Vmean:     39.140 cm/s LVOT VTI:       0.079 m  AORTA Ao Root diam: 2.80 cm Ao Asc diam:  2.90 cm MITRAL VALVE                TRICUSPID VALVE MV Area (PHT): 6.54 cm     TR Peak grad:   34.8 mmHg MV Decel Time: 116 msec     TR Vmax:        295.00 cm/s MR Peak grad: 53.1 mmHg MR Vmax:      364.50 cm/s   SHUNTS MV E velocity: 116.00 cm/s  Systemic VTI:  0.08 m MV A velocity: 34.00 cm/s   Systemic Diam: 1.90 cm MV E/A ratio:  3.41 Dwayne D Callwood MD Electronically signed by Cara JONETTA Lovelace MD Signature Date/Time: 08/19/2024/5:02:28 PM    Final    CT Angio Chest  Pulmonary Embolism (PE) W or WO Contrast Result Date: 08/19/2024 EXAM: CTA CHEST 08/19/2024 06:31:42 AM TECHNIQUE: CTA of the chest was performed without and with the administration of 75 mL of iohexol (OMNIPAQUE) 350 MG/ML injection. Multiplanar reformatted images are provided for review. MIP images are provided for review. Automated exposure control, iterative reconstruction, and/or weight based adjustment of the mA/kV was utilized to reduce the radiation dose to as low as reasonably achievable. COMPARISON: CT of the head dated 12/24/2006. CLINICAL HISTORY: Pulmonary embolism (PE) suspected, low to intermediate prob, neg D-dimer. FINDINGS: PULMONARY ARTERIES: Pulmonary arteries are adequately opacified for evaluation. There are no discrete intraluminal filling defects present within the pulmonary arteries. There is suboptimal opacification of the subsegmental arteries within the lung bases bilaterally, likely secondary to streaky areas of peribronchovascular opacification/consolidation. Main pulmonary artery is normal in caliber. MEDIASTINUM: The heart is moderately enlarged. There is mild-to-moderate atheromatous disease within the thoracic aorta. There is intrathoracic goiter present on the left. LYMPH NODES: There is some shotty mediastinal lymphadenopathy. No hilar or axillary lymphadenopathy. LUNGS AND PLEURA: There are hazy and patchy peribronchovascular opacities also present within the left upper lobe and right middle lobe. There is a moderate right-sided pleural effusion and a mild-to-moderate left effusion. No pneumothorax. UPPER ABDOMEN: Limited images of the upper abdomen are unremarkable. SOFT TISSUES AND BONES: No acute bone or soft tissue abnormality. IMPRESSION: 1. No evidence of pulmonary embolism, although evaluation is limited by suboptimal opacification of the subsegmental arteries within the lung bases bilaterally. 2. Moderate right-sided pleural effusion and mild-to-moderate left effusion.  3. Moderately enlarged  heart. 4. 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 Electronically signed by: Evalene Coho MD 08/19/2024 06:58 AM EDT RP Workstation: [id number]   DG Chest Port 1 View Result Date: 08/18/2024 EXAM: 1 VIEW XRAY OF THE CHEST 08/18/2024 08:14:00 PM COMPARISON: Chest x-ray 02/11/2018. CLINICAL HISTORY: Chest pain. The patient states multiple times this week she has had episodes of feeling light headed and chest pain. Pt reports pain in chest was worse tonight. Hx of HTN and mitral valve repair. FINDINGS: LUNGS AND PLEURA: There are patchy bilateral lower lung infiltrates. No pulmonary edema. No pleural effusion. No pneumothorax. HEART AND MEDIASTINUM: Heart is mildly enlarged. BONES AND SOFT TISSUES: No acute osseous abnormality. IMPRESSION: 1. Patchy bilateral lower lung infiltrates. 2. Mild cardiomegaly. Electronically signed by: Greig Pique MD 08/18/2024 08:25 PM EDT RP Workstation: [id number]    ECHO pending  TELEMETRY (personally reviewed): Atrial fibrillation rate 120-130s  EKG (personally reviewed): atrial fibrillation RVR rate 142 bpm with PVCs  Data reviewed by me 08/21/2024: last 24h vitals tele labs imaging I/O ED provider note, admission H&P  Principal Problem:   Paroxysmal atrial fibrillation with rapid ventricular response (HCC) Active Problems:   Essential hypertension   Chronic venous insufficiency   MVP (mitral valve prolapse) s/p repair 2008   Alzheimer's dementia without behavioral disturbance (HCC)   Possible acute CHF (HCC)   Dyspnea on exertion   Acute respiratory failure with hypoxia (HCC)   Pressure injury of skin    ASSESSMENT AND PLAN:  Whitney Pace is a 78 y.o. female  with a past medical history of hypertension, hyperlipidemia, questionable atrial fibrillation, mitral valve prolapse s/p repair 15 years ago, PVCs, dementia who presented to the ED on 08/18/2024 for chest discomfort, weakness.  Found to be in atrial fibrillation RVR in the ED.   Cardiology was consulted for further evaluation.   # Atrial fibrillation RVR # ?Hx atrial fibrillation # New cardiomyopathy # Hypertension # Frequent PVCs # Mitral valve prolapse s/p repair Patient brought to the ED after not feeling well for 1 to 2 days, found to be in atrial fibrillation RVR on EKG.  Troponins normal x 2.  Started on IV diltiazem in the ED but uptitration was limited by borderline blood pressure. Echo with EF 30-35%, Ant/apical/septal lat hypo (?stress-induced vs ischemic - patient without any anginal symptoms, trops minimal), mild to moderate MR, moderate to severe TR.  - Continue metoprolol  succinate 25 mg BID for rate control and GDMT.  - Start Farxiga 10 mg daily for GDMT (co-pay $45 per month) - Will defer resuming PO amiodarone at this time. Can consider outpatient if she has recurrence of AF.  - Continue Eliquis 5 mg twice daily for stroke risk reduction. - Continue aspirin  81 mg daily.   Ok for discharge today from a cardiac perspective. Will arrange for follow up in clinic with Dr. Ammon in 1-2 weeks.    This patient's plan of care was discussed and created with Dr. Florencio and he is in agreement.  Signed: Danita Bloch, PA-C  08/21/2024, 11:13 AM [facility] Cardiology

## 2024-08-21 NOTE — Plan of Care (Signed)

## 2024-08-21 NOTE — Evaluation (Signed)
 Physical Therapy Evaluation Patient Details Name: Whitney Pace MRN: [id number] DOB: Mar 04, 1946 Today's Date: 08/21/2024  History of Present Illness  78 y.o. female  with a past medical history of hypertension, hyperlipidemia, questionable atrial fibrillation, mitral valve prolapse s/p repair 15 years ago, PVCs, dementia who presented to the ED on 08/18/2024 with a several day history of multiple episodes of sudden onset shortness of breath associated with chest discomfort and lightheadedness. Found to be in atrial fibrillation RVR in the ED.  Clinical Impression  Pt is pleasant 78 y.o. female admitted for a fib with RVR. Prior to hospitalization, pt was independent with ambulation without AD. Pt was given intermittent assist/supervision with ADLs. Pt able to perform bed mobility with mod I.  Pt able to stand with Min A to steady balance from lowest bed height. Attempt to amb with HHA made and pt demonstrated need for support and reaching for external surfaces.When given RW, pt able to amb 61ft with CGA and able to navigate obstacles appropriately. HR after amb at 77bpm.  Pt demonstrates deficits in balance/strength/activity tolerance. Would benefit from skilled PT to address above deficits and promote optimal return to PLOF.         If plan is discharge home, recommend the following: A little help with walking and/or transfers;A little help with bathing/dressing/bathroom;Direct supervision/assist for medications management;Direct supervision/assist for financial management;Supervision due to cognitive status   Can travel by private vehicle        Equipment Recommendations Rolling walker (2 wheels)  Recommendations for Other Services       Functional Status Assessment Patient has had a recent decline in their functional status and demonstrates the ability to make significant improvements in function in a reasonable and predictable amount of time.     Precautions / Restrictions  Precautions Precautions: Fall Recall of Precautions/Restrictions: Impaired Restrictions Weight Bearing Restrictions Per Provider Order: No      Mobility  Bed Mobility Overal bed mobility: Modified Independent             General bed mobility comments: No assist needed for supine<>sit. Increased time    Transfers Overall transfer level: Needs assistance Equipment used: 1 person hand held assist, Rolling walker (2 wheels) Transfers: Sit to/from Stand, Bed to chair/wheelchair/BSC Sit to Stand: Min assist   Step pivot transfers: Min assist       General transfer comment: HHA initially with STS from lowest bed height and amb to bathroom. Pt required min A to steady balance. STS using RW from toilet with CGA for safety.    Ambulation/Gait Ambulation/Gait assistance: Contact guard assist Gait Distance (Feet): 80 Feet Assistive device: Rolling walker (2 wheels) Gait Pattern/deviations: Step-through pattern, Decreased stride length Gait velocity: dec     General Gait Details: HHA assist initially however pt reaching for external surfaces for support. Pt given RW and demonstrated understanding of use. CGA for safety. Pt able to navigate obstacles appropriately using RW.  Stairs            Wheelchair Mobility     Tilt Bed    Modified Rankin (Stroke Patients Only)       Balance Overall balance assessment: Needs assistance Sitting-balance support: Feet supported, No upper extremity supported Sitting balance-Leahy Scale: Good     Standing balance support: Single extremity supported, Bilateral upper extremity supported, During functional activity Standing balance-Leahy Scale: Fair Standing balance comment: able to stand using HHA from EOB, however required min A to steady balance initially. Improved balance with RW.  Pertinent Vitals/Pain Pain Assessment Pain Assessment: No/denies pain    Home Living Family/patient  expects to be discharged to:: Private residence Living Arrangements: Spouse/significant other Available Help at Discharge: Family;Available 24 hours/day Type of Home: House Home Access: Stairs to enter Entrance Stairs-Rails: Right;Left;Can reach both Entrance Stairs-Number of Steps: 3-4 Alternate Level Stairs-Number of Steps: flight Home Layout: Two level;Able to live on main level with bedroom/bathroom Home Equipment: Cane - single point;Grab bars - toilet;Shower seat      Prior Function Prior Level of Function : Independent/Modified Independent;Needs assist             Mobility Comments: Independent without use of AD. ADLs Comments: Assist when getting in/out of the tub and intermittent assist for dressing and bathing, assist for IADL     Extremity/Trunk Assessment   Upper Extremity Assessment Upper Extremity Assessment: Overall WFL for tasks assessed    Lower Extremity Assessment Lower Extremity Assessment: Overall WFL for tasks assessed    Cervical / Trunk Assessment Cervical / Trunk Assessment: Normal  Communication   Communication Communication: No apparent difficulties    Cognition Arousal: Alert Behavior During Therapy: WFL for tasks assessed/performed   PT - Cognitive impairments: History of cognitive impairments                       PT - Cognition Comments: Pleasant and agreeable to PT. Following commands: Intact       Cueing Cueing Techniques: Verbal cues, Visual cues     General Comments      Exercises     Assessment/Plan    PT Assessment Patient needs continued PT services  PT Problem List Decreased strength;Decreased balance;Decreased mobility;Decreased cognition;Decreased knowledge of use of DME       PT Treatment Interventions DME instruction;Gait training;Functional mobility training;Stair training;Therapeutic activities;Therapeutic exercise;Balance training;Neuromuscular re-education;Patient/family education    PT Goals  (Current goals can be found in the Care Plan section)  Acute Rehab PT Goals Patient Stated Goal: none stated PT Goal Formulation: With patient/family Time For Goal Achievement: 09/04/24 Potential to Achieve Goals: Good    Frequency Min 1X/week     Co-evaluation               AM-PAC PT 6 Clicks Mobility  Outcome Measure Help needed turning from your back to your side while in a flat bed without using bedrails?: None Help needed moving from lying on your back to sitting on the side of a flat bed without using bedrails?: None Help needed moving to and from a bed to a chair (including a wheelchair)?: A Little Help needed standing up from a chair using your arms (e.g., wheelchair or bedside chair)?: A Little Help needed to walk in hospital room?: A Little Help needed climbing 3-5 steps with a railing? : A Lot 6 Click Score: 19    End of Session Equipment Utilized During Treatment: Gait belt Activity Tolerance: Patient tolerated treatment well Patient left: in bed;with call bell/phone within reach;with bed alarm set;with family/visitor present Nurse Communication: Mobility status PT Visit Diagnosis: Unsteadiness on feet (R26.81);Difficulty in walking, not elsewhere classified (R26.2)    Time: 1000-1022 PT Time Calculation (min) (ACUTE ONLY): 22 min   Charges:                 Gennett Garcia, SPT   Moselle Rister 08/21/2024, 11:08 AM

## 2024-08-21 NOTE — Evaluation (Signed)
 Occupational Therapy Evaluation Patient Details Name: Whitney Pace MRN: [id number] DOB: 1946/04/16 Today's Date: 08/21/2024   History of Present Illness   78 y.o. female  with a past medical history of hypertension, hyperlipidemia, questionable atrial fibrillation, mitral valve prolapse s/p repair 15 years ago, PVCs, dementia who presented to the ED on 08/18/2024 with a several day history of multiple episodes of sudden onset shortness of breath associated with chest discomfort and lightheadedness. Found to be in atrial fibrillation RVR in the ED.     Clinical Impressions Pt was seen for OT evaluation this date. Prior to hospital admission, pt was indep with mobility and required assist for tub transfers from spouse and assist for IADL. Pt lives with spouse and will have family assist/support upon return home. Pt presents with deficits in activity tolerance and cardiopulmonary status, affecting safe and optimal ADL completion. Pt currently requires increased time/effort and PRN assist for ADL and mobility tasks compared to baseline. Pt/family educated in energy conservation strategies and falls prevention strategies including activity pacing, work simplification, bath in tub versus seated on shower stool, and AE/DME. Pt/family verbalized understanding.  Pt would benefit from skilled OT services to address noted impairments and functional limitations (see below for any additional details) in order to maximize safety and independence while minimizing future risk of falls, injury, and readmission. Anticipate pt would benefit from follow up OT services upon acute hospital DC.    If plan is discharge home, recommend the following:   A little help with walking and/or transfers;A little help with bathing/dressing/bathroom;Assistance with cooking/housework;Assist for transportation;Help with stairs or ramp for entrance;Direct supervision/assist for medications management     Functional Status  Assessment   Patient has had a recent decline in their functional status and demonstrates the ability to make significant improvements in function in a reasonable and predictable amount of time.     Equipment Recommendations   None recommended by OT     Recommendations for Other Services         Precautions/Restrictions   Precautions Precautions: Fall Recall of Precautions/Restrictions: Impaired Restrictions Weight Bearing Restrictions Per Provider Order: No     Mobility Bed Mobility Overal bed mobility: Modified Independent                  Transfers Overall transfer level: Needs assistance Equipment used: 1 person hand held assist, Rolling walker (2 wheels) Transfers: Sit to/from Stand, Bed to chair/wheelchair/BSC Sit to Stand: Min assist           General transfer comment: HHA initially with STS from lowest bed height and amb to bathroom. Pt required min A to steady balance. STS using RW from toilet with CGA for safety.      Balance Overall balance assessment: Needs assistance Sitting-balance support: Feet supported, No upper extremity supported Sitting balance-Leahy Scale: Good     Standing balance support: Single extremity supported, Bilateral upper extremity supported, During functional activity Standing balance-Leahy Scale: Fair Standing balance comment: able to stand using HHA from EOB, however required min A to steady balance initially. Improved balance with RW.                           ADL either performed or assessed with clinical judgement   ADL  General ADL Comments: Spouse/pt endorse being able to complete ADL with assist as needed from spouse/family.     Vision         Perception         Praxis         Pertinent Vitals/Pain Pain Assessment Pain Assessment: No/denies pain     Extremity/Trunk Assessment Upper Extremity Assessment Upper Extremity  Assessment: Overall WFL for tasks assessed   Lower Extremity Assessment Lower Extremity Assessment: Overall WFL for tasks assessed   Cervical / Trunk Assessment Cervical / Trunk Assessment: Normal   Communication Communication Communication: No apparent difficulties   Cognition Arousal: Alert Behavior During Therapy: WFL for tasks assessed/performed Cognition: History of cognitive impairments                               Following commands: Intact       Cueing  General Comments   Cueing Techniques: Verbal cues;Visual cues      Exercises Other Exercises Other Exercises: Pt/family educated in energy conservation strategies and falls prevention strategies including activity pacing, work simplification, bath in tub versus seated on shower stool, and AE/DME   Shoulder Instructions      Home Living Family/patient expects to be discharged to:: Private residence Living Arrangements: Spouse/significant other Available Help at Discharge: Family;Available 24 hours/day Type of Home: House Home Access: Stairs to enter [employer] of Steps: 3-4 Entrance Stairs-Rails: Right;Left;Can reach both Home Layout: Two level;Able to live on main level with bedroom/bathroom Alternate Level Stairs-Number of Steps: flight   Bathroom Shower/Tub: Tub/shower unit         Home Equipment: Cane - single point;Grab bars - toilet;Shower seat          Prior Functioning/Environment Prior Level of Function : Independent/Modified Independent;Needs assist             Mobility Comments: Independent without use of AD. ADLs Comments: Assist when getting in/out of the tub and intermittent assist for dressing and bathing, assist for IADL    OT Problem List: Decreased activity tolerance;Decreased knowledge of use of DME or AE;Cardiopulmonary status limiting activity   OT Treatment/Interventions: Self-care/ADL training;Therapeutic activities;Energy conservation;DME and/or  AE instruction;Patient/family education      OT Goals(Current goals can be found in the care plan section)   Acute Rehab OT Goals Patient Stated Goal: go home OT Goal Formulation: With patient/family Time For Goal Achievement: 09/04/24 Potential to Achieve Goals: Good ADL Goals Additional ADL Goal #1: Pt will complete all aspects of bathing, primarily in sitting, with caregiver indep with assisting, 1/1 opportunity, using learned strategies for falls prevention and energy conservation. Additional ADL Goal #2: Pt/family will verbalize plan to implement at least 2 learned falls prevention strategies to maximize safety during ADL/mobility.   OT Frequency:  Min 1X/week    Co-evaluation              AM-PAC OT 6 Clicks Daily Activity     Outcome Measure Help from another person eating meals?: None Help from another person taking care of personal grooming?: None Help from another person toileting, which includes using toliet, bedpan, or urinal?: A Little Help from another person bathing (including washing, rinsing, drying)?: A Little Help from another person to put on and taking off regular upper body clothing?: None Help from another person to put on and taking off regular lower body clothing?: A Little 6 Click Score: 21   End of Session  Activity Tolerance: Patient tolerated treatment well Patient left: in bed;with call bell/phone within reach;with nursing/sitter in room;with family/visitor present  OT Visit Diagnosis: Other abnormalities of gait and mobility (R26.89)                Time: 1050-1102 OT Time Calculation (min): 12 min Charges:  OT General Charges $OT Visit: 1 Visit OT Evaluation $OT Eval Low Complexity: 1 Low  Warren SAUNDERS., MPH, MS, OTR/L ascom [phone] 08/21/24, 11:13 AM

## 2024-08-21 NOTE — Discharge Summary (Signed)
 Physician Discharge Summary  Patient: Whitney Pace FMW:[id number] DOB: April 28, 1946   Code Status: Full Code Admit date: 08/18/2024 Discharge date: 08/21/2024 Disposition: Home health, PT and OT PCP: Cleotilde Oneil FALCON, MD  Recommendations for Outpatient Follow-up:  Follow up with PCP within 1-2 weeks Regarding general hospital follow up and preventative care Recommend discussing home medications as family expressed some worry about interactions and dosing timing to prevent sedation with current regimen Follow up with cardiology   Discharge Diagnoses:  Principal Problem:   Paroxysmal atrial fibrillation with rapid ventricular response (HCC) Active Problems:   Dyspnea on exertion   Possible acute CHF (HCC)   Essential hypertension   Chronic venous insufficiency   MVP (mitral valve prolapse) s/p repair 2008   Alzheimer's dementia without behavioral disturbance (HCC)   Acute respiratory failure with hypoxia (HCC)   Pressure injury of skin  Brief Hospital Course Summary: Whitney Pace is a 78 y.o. female with medical history significant for HTN, HLD, mitral valve repair 2008, Alzheimer's dementia, PVCs versus paroxysmal [phone], PACs on Holter), not on AC (last seen by cardiology 07/14/2024), presenting with a several day history of multiple episodes of sudden onset shortness of breath associated with chest discomfort and lightheadedness. In the ED in rapid A-fib to 142 with otherwise normal vitals Labs notable for troponin 10, BNP 561 Potassium and magnesium  normal Creatinine 1.02 (0.9, 01/2024).  BMP otherwise unremarkable CBC normal Hepatic function panel notable only for slightly elevated AST of 42. EKG with A-fib at 142 Chest x-ray showing patchy bilateral lower lung infiltrates and mild cardiomegaly, no pulmonary edema or pleural effusion Patient was given a diltiazem bolus started on an infusion. Cardiology was consulted.  She converted to sinus rhythm with the diltiazem which was  discontinued and transitioned to her home metoprolol . She stayed stable with rate control on the metoprolol . She was also started on farxiga.   Additionally-   # CHF exacerbation- Echo with EF 30-35%, Ant/apical/septal lat hypo (?stress-induced vs ischemic - patient without any anginal symptoms, trops minimal), mild to moderate MR, moderate to severe TR.  Likely mediated by a-fib. S/p lasix. Appears improved   # Pleural effusions Likely 2/2 chf from a fib. Satting normally, breathing comfortably, no s/s infection - diuresis    # HTN Soft bps - home amlodipine discontinued  PT/OT evaluated and recommended HH.    All other chronic conditions were treated with home medications.    Discharge Condition: Good, improved Recommended discharge diet: Regular healthy diet  Consultations: Cardiology   Procedures/Studies: Echo   Discharge Instructions     Amb referral to [facility]   Complete by: As directed    Discharge patient   Complete by: As directed    Discharge disposition: 06-Home-Health Care Svc   Discharge patient date: 08/21/2024      Allergies as of 08/21/2024       Reactions   Atropine Other (See Comments)   Increased blood pressure   Escitalopram Oxalate    Other reaction(s): Other (See Comments) Caused her to feel jittery   Neomycin-bacitracin Zn-polymyx    Other reaction(s): Unknown Hypertension    Promethazine Other (See Comments)   anxiety   Rivastigmine Other (See Comments)   Other reaction(s): Vomiting   Rivastigmine Tartrate Nausea Only   Tramadol Nausea And Vomiting   Patient also states flushing and throat was hoarse   Venlafaxine    Other reaction(s): Hallucination        Medication List  STOP taking these medications    amLODipine 2.5 MG tablet Commonly known as: NORVASC   aspirin  EC 81 MG tablet       TAKE these medications    ALPRAZolam  0.25 MG tablet Commonly known as: XANAX  Take 0.25 mg by mouth 2 (two) times daily as  needed for anxiety.   amitriptyline 10 MG tablet Commonly known as: ELAVIL Take 10 mg by mouth at bedtime.   apixaban 5 MG Tabs tablet Commonly known as: ELIQUIS Take 1 tablet (5 mg total) by mouth 2 (two) times daily.   dapagliflozin propanediol 10 MG Tabs tablet Commonly known as: FARXIGA Take 1 tablet (10 mg total) by mouth daily.   dorzolamide  2 % ophthalmic solution Commonly known as: TRUSOPT  Place 1 drop into both eyes 2 (two) times daily.   galantamine 4 MG tablet Commonly known as: RAZADYNE Take 4 mg by mouth 2 (two) times daily with a meal.   ketorolac  0.5 % ophthalmic solution Commonly known as: ACULAR  Place 1 drop into the left eye 2 (two) times daily.   lovastatin 40 MG tablet Commonly known as: MEVACOR Take 40 mg by mouth daily with supper.   memantine 10 MG tablet Commonly known as: NAMENDA Take 10 mg by mouth 2 (two) times daily.   metoprolol  succinate 25 MG 24 hr tablet Commonly known as: TOPROL -XL Take 25 mg by mouth daily.   PARoxetine 30 MG tablet Commonly known as: PAXIL Take 30 mg by mouth at bedtime.   QUEtiapine 25 MG tablet Commonly known as: SEROQUEL Take 25 mg by mouth at bedtime.   traZODone 50 MG tablet Commonly known as: DESYREL Take 1 tablet by mouth at bedtime.               Durable Medical Equipment  (From admission, onward)           Start     Ordered   08/21/24 1029  For home use only DME Walker rolling  Once       Question Answer Comment  Walker: With 5 Inch Wheels   Patient needs a walker to treat with the following condition Physical deconditioning      08/21/24 1029            Follow-up Information     Launie Elodie Ronal Tinnie, NP. Go on 09/03/2024.   Specialty: Nurse Practitioner Why: Hospital Follow-up: October 30th @ 8:30 AM. Reesa information: [address] [place] [address] [phone]                Subjective   Pt reports feeling well. Denies chest pain or  shortness of breath.   All questions and concerns were addressed at time of discharge.  Objective  Blood pressure 121/82, pulse 65, temperature 97.8 F (36.6 C), temperature source Oral, resp. rate 18, height 5' 2 (1.575 m), weight 61.2 kg, SpO2 96%.   General: Pt is alert, awake, not in acute distress Cardiovascular: RRR, S1/S2 +, no rubs, no gallops Respiratory: CTA bilaterally, no wheezing, no rhonchi Abdominal: Soft, NT, ND, bowel sounds + Extremities: no edema, no cyanosis  The results of significant diagnostics from this hospitalization (including imaging, microbiology, ancillary and laboratory) are listed below for reference.   Imaging studies: ECHOCARDIOGRAM COMPLETE Result Date: 08/19/2024    ECHOCARDIOGRAM REPORT   Patient Name:   Whitney Pace Date of Exam: 08/19/2024 Medical Rec #:  [id number]    Height:       62.0 in Accession #:  [id number]   Weight:       135.0 lb Date of Birth:  1945-12-23     BSA:          1.618 m Patient Age:    78 years     BP:           101/64 mmHg Patient Gender: F            HR:           118 bpm. Exam Location:  [facility] Procedure: 2D Echo, Cardiac Doppler and Color Doppler (Both Spectral and Color            Flow Doppler were utilized during procedure). Indications:     Atrial Fibrillation I48.91  History:         Patient has prior history of Echocardiogram examinations, most                  recent 02/12/2018. Arrythmias:Atrial Fibrillation and PVC,                  Signs/Symptoms:Dyspnea; Risk Factors:Hypertension and                  Dyslipidemia.  Sonographer:     Thea Norlander RCS Referring Phys:  [id number] CARALYN HUDSON Diagnosing Phys: Dwayne D Callwood MD IMPRESSIONS  1. Ant/apical/septal lat hypo . Cardiomyopathy new since 2019 EF was 79%.  2. Left ventricular ejection fraction, by estimation, is 30 to 35%. The left ventricle has moderately decreased function. The left ventricle demonstrates regional wall motion abnormalities (see scoring  diagram/findings for description). Left ventricular  diastolic parameters are consistent with Grade III diastolic dysfunction (restrictive).  3. Right ventricular systolic function is low normal. The right ventricular size is normal.  4. Left atrial size was moderately dilated.  5. Right atrial size was mild to moderately dilated.  6. The mitral valve is degenerative. Mild to moderate mitral valve regurgitation.  7. Tricuspid valve regurgitation is moderate to severe.  8. The aortic valve is calcified. Aortic valve regurgitation is trivial. Aortic valve sclerosis is present, with no evidence of aortic valve stenosis. FINDINGS  Left Ventricle: Left ventricular ejection fraction, by estimation, is 30 to 35%. The left ventricle has moderately decreased function. The left ventricle demonstrates regional wall motion abnormalities. Strain was performed and the global longitudinal strain is indeterminate. The left ventricular internal cavity size was normal in size. There is borderline left ventricular hypertrophy. Abnormal (paradoxical) septal motion, consistent with left bundle branch block. Left ventricular diastolic parameters  are consistent with Grade III diastolic dysfunction (restrictive). Right Ventricle: The right ventricular size is normal. No increase in right ventricular wall thickness. Right ventricular systolic function is low normal. Left Atrium: Left atrial size was moderately dilated. Right Atrium: Right atrial size was mild to moderately dilated. Pericardium: There is no evidence of pericardial effusion. Mitral Valve: The mitral valve is degenerative in appearance. There is mild thickening of the mitral valve leaflet(s). There is mild calcification of the mitral valve leaflet(s). Mild to moderate mitral annular calcification. Mild to moderate mitral valve regurgitation. Tricuspid Valve: The tricuspid valve is grossly normal. Tricuspid valve regurgitation is moderate to severe. Aortic Valve: The aortic  valve is calcified. Aortic valve regurgitation is trivial. Aortic valve sclerosis is present, with no evidence of aortic valve stenosis. Aortic valve peak gradient measures 2.2 mmHg. Pulmonic Valve: The pulmonic valve was normal in structure. Pulmonic valve regurgitation is not visualized. Aorta: The ascending aorta was not well visualized.  IAS/Shunts: No atrial level shunt detected by color flow Doppler. Additional Comments: Ant/apical/septal lat hypo . Cardiomyopathy new since 2019 EF was 79%. 3D was performed not requiring image post processing on an independent workstation and was indeterminate.  LEFT VENTRICLE PLAX 2D LVIDd:         4.20 cm   Diastology LVIDs:         2.90 cm   LV e' medial:    4.46 cm/s LV PW:         1.20 cm   LV E/e' medial:  26.0 LV IVS:        1.00 cm   LV e' lateral:   6.66 cm/s LVOT diam:     1.90 cm   LV E/e' lateral: 17.4 LV SV:         22 LV SV Index:   14 LVOT Area:     2.84 cm  RIGHT VENTRICLE            IVC RV S prime:     9.25 cm/s  IVC diam: 2.40 cm LEFT ATRIUM             Index        RIGHT ATRIUM           Index LA diam:        5.00 cm 3.09 cm/m   RA Area:     23.20 cm LA Vol (A2C):   88.8 ml 54.90 ml/m  RA Volume:   61.40 ml  37.96 ml/m LA Vol (A4C):   87.5 ml 54.09 ml/m LA Biplane Vol: 90.6 ml 56.01 ml/m  AORTIC VALVE AV Area (Vmax): 2.37 cm AV Vmax:        73.80 cm/s AV Peak Grad:   2.2 mmHg LVOT Vmax:      61.66 cm/s LVOT Vmean:     39.140 cm/s LVOT VTI:       0.079 m  AORTA Ao Root diam: 2.80 cm Ao Asc diam:  2.90 cm MITRAL VALVE                TRICUSPID VALVE MV Area (PHT): 6.54 cm     TR Peak grad:   34.8 mmHg MV Decel Time: 116 msec     TR Vmax:        295.00 cm/s MR Peak grad: 53.1 mmHg MR Vmax:      364.50 cm/s   SHUNTS MV E velocity: 116.00 cm/s  Systemic VTI:  0.08 m MV A velocity: 34.00 cm/s   Systemic Diam: 1.90 cm MV E/A ratio:  3.41 Dwayne D Callwood MD Electronically signed by Cara JONETTA Lovelace MD Signature Date/Time: 08/19/2024/5:02:28 PM    Final     CT Angio Chest Pulmonary Embolism (PE) W or WO Contrast Result Date: 08/19/2024 EXAM: CTA CHEST 08/19/2024 06:31:42 AM TECHNIQUE: CTA of the chest was performed without and with the administration of 75 mL of iohexol (OMNIPAQUE) 350 MG/ML injection. Multiplanar reformatted images are provided for review. MIP images are provided for review. Automated exposure control, iterative reconstruction, and/or weight based adjustment of the mA/kV was utilized to reduce the radiation dose to as low as reasonably achievable. COMPARISON: CT of the head dated 12/24/2006. CLINICAL HISTORY: Pulmonary embolism (PE) suspected, low to intermediate prob, neg D-dimer. FINDINGS: PULMONARY ARTERIES: Pulmonary arteries are adequately opacified for evaluation. There are no discrete intraluminal filling defects present within the pulmonary arteries. There is suboptimal opacification of the subsegmental arteries within the lung bases bilaterally, likely secondary  to streaky areas of peribronchovascular opacification/consolidation. Main pulmonary artery is normal in caliber. MEDIASTINUM: The heart is moderately enlarged. There is mild-to-moderate atheromatous disease within the thoracic aorta. There is intrathoracic goiter present on the left. LYMPH NODES: There is some shotty mediastinal lymphadenopathy. No hilar or axillary lymphadenopathy. LUNGS AND PLEURA: There are hazy and patchy peribronchovascular opacities also present within the left upper lobe and right middle lobe. There is a moderate right-sided pleural effusion and a mild-to-moderate left effusion. No pneumothorax. UPPER ABDOMEN: Limited images of the upper abdomen are unremarkable. SOFT TISSUES AND BONES: No acute bone or soft tissue abnormality. IMPRESSION: 1. No evidence of pulmonary embolism, although evaluation is limited by suboptimal opacification of the subsegmental arteries within the lung bases bilaterally. 2. Moderate right-sided pleural effusion and  mild-to-moderate left effusion. 3. Moderately enlarged heart. 4. 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 Electronically signed by: Evalene Coho MD 08/19/2024 06:58 AM EDT RP Workstation: [id number]   DG Chest Port 1 View Result Date: 08/18/2024 EXAM: 1 VIEW XRAY OF THE CHEST 08/18/2024 08:14:00 PM COMPARISON: Chest x-ray 02/11/2018. CLINICAL HISTORY: Chest pain. The patient states multiple times this week she has had episodes of feeling light headed and chest pain. Pt reports pain in chest was worse tonight. Hx of HTN and mitral valve repair. FINDINGS: LUNGS AND PLEURA: There are patchy bilateral lower lung infiltrates. No pulmonary edema. No pleural effusion. No pneumothorax. HEART AND MEDIASTINUM: Heart is mildly enlarged. BONES AND SOFT TISSUES: No acute osseous abnormality. IMPRESSION: 1. Patchy bilateral lower lung infiltrates. 2. Mild cardiomegaly. Electronically signed by: Greig Pique MD 08/18/2024 08:25 PM EDT RP Workstation: [id number]    Labs: Basic Metabolic Panel: Recent Labs  Lab 08/18/24 1943 08/18/24 2056 08/20/24 0243 08/21/24 0443  NA 138  --  134* 133*  K 3.9  --  4.2 3.9  CL 105  --  101 103  CO2 20*  --  20* 22  GLUCOSE 165*  --  132* 126*  BUN 22  --  24* 23  CREATININE 1.02*  --  1.63* 1.21*  CALCIUM  9.0  --  8.9 9.0  MG  --  2.5*  --   --    CBC: Recent Labs  Lab 08/18/24 1943 08/20/24 0243 08/21/24 0443  WBC 7.1 7.6 8.2  HGB 14.0 13.1 12.9  HCT 43.3 40.0 38.6  MCV 93.9 93.5 92.1  PLT 245 241 208   Microbiology: Results for orders placed or performed during the hospital encounter of 10/10/16  Surgical pcr screen     Status: None   Collection Time: 10/10/16  9:00 AM   Specimen: Nasal Mucosa; Nasal Swab  Result Value Ref Range Status   MRSA, PCR NEGATIVE NEGATIVE Final   Staphylococcus aureus NEGATIVE NEGATIVE Final    Comment:        The Xpert SA Assay (FDA approved for NASAL specimens in patients over 45 years of age), is one component of a  comprehensive surveillance program.  Test performance has been validated by [facility] for patients greater than or equal to 65 year old. It is not intended to diagnose infection nor to guide or monitor treatment.     Time coordinating discharge: Over 30 minutes  Marien LITTIE Piety, MD  [facility] 08/21/2024, 12:15 PM

## 2024-08-21 NOTE — Progress Notes (Signed)
 Heart Failure Navigator Progress Note  Assessed for Heart & Vascular TOC clinic readiness.  Patient does not meet criteria due to current [facility] Cardiology patient.   Navigator will sign off at this time.  Roxy Horseman, RN, BSN [facility] Heart Failure Navigator Secure Chat Only
